# Patient Record
Sex: Female | Born: 1940 | Race: White | Hispanic: No | Marital: Married | Smoking: Never smoker
Health system: Southern US, Community
[De-identification: ages and names within clinical notes are randomized; demographics above are authoritative.]

## PROBLEM LIST (undated history)

## (undated) DIAGNOSIS — I4891 Unspecified atrial fibrillation: Secondary | ICD-10-CM

## (undated) DIAGNOSIS — K219 Gastro-esophageal reflux disease without esophagitis: Secondary | ICD-10-CM

## (undated) DIAGNOSIS — I10 Essential (primary) hypertension: Secondary | ICD-10-CM

## (undated) DIAGNOSIS — E785 Hyperlipidemia, unspecified: Secondary | ICD-10-CM

## (undated) DIAGNOSIS — Z8709 Personal history of other diseases of the respiratory system: Secondary | ICD-10-CM

## (undated) DIAGNOSIS — R2990 Unspecified symptoms and signs involving the nervous system: Secondary | ICD-10-CM

## (undated) DIAGNOSIS — F419 Anxiety disorder, unspecified: Secondary | ICD-10-CM

## (undated) DIAGNOSIS — R51 Headache: Secondary | ICD-10-CM

## (undated) DIAGNOSIS — J45909 Unspecified asthma, uncomplicated: Secondary | ICD-10-CM

## (undated) DIAGNOSIS — F32A Depression, unspecified: Secondary | ICD-10-CM

## (undated) DIAGNOSIS — Z87442 Personal history of urinary calculi: Secondary | ICD-10-CM

## (undated) DIAGNOSIS — R569 Unspecified convulsions: Secondary | ICD-10-CM

## (undated) DIAGNOSIS — T4145XA Adverse effect of unspecified anesthetic, initial encounter: Secondary | ICD-10-CM

## (undated) DIAGNOSIS — M1990 Unspecified osteoarthritis, unspecified site: Secondary | ICD-10-CM

## (undated) DIAGNOSIS — Z9889 Other specified postprocedural states: Secondary | ICD-10-CM

## (undated) DIAGNOSIS — T8859XA Other complications of anesthesia, initial encounter: Secondary | ICD-10-CM

## (undated) DIAGNOSIS — D649 Anemia, unspecified: Secondary | ICD-10-CM

## (undated) DIAGNOSIS — R112 Nausea with vomiting, unspecified: Secondary | ICD-10-CM

## (undated) DIAGNOSIS — M5126 Other intervertebral disc displacement, lumbar region: Secondary | ICD-10-CM

## (undated) HISTORY — PX: BACK SURGERY: SHX140

## (undated) HISTORY — DX: Other intervertebral disc displacement, lumbar region: M51.26

## (undated) HISTORY — DX: Unspecified convulsions: R56.9

## (undated) HISTORY — DX: Hyperlipidemia, unspecified: E78.5

## (undated) HISTORY — PX: TONSILLECTOMY: SUR1361

## (undated) HISTORY — PX: INNER EAR SURGERY: SHX679

## (undated) HISTORY — PX: ABDOMINAL HYSTERECTOMY: SHX81

## (undated) HISTORY — PX: ROTATOR CUFF REPAIR: SHX139

## (undated) HISTORY — DX: Unspecified symptoms and signs involving the nervous system: R29.90

---

## 1983-02-02 HISTORY — PX: FOOT TENDON SURGERY: SHX958

## 1997-07-08 ENCOUNTER — Ambulatory Visit (HOSPITAL_COMMUNITY): Admission: RE | Admit: 1997-07-08 | Discharge: 1997-07-08 | Payer: Self-pay | Admitting: Internal Medicine

## 1998-07-30 ENCOUNTER — Encounter: Payer: Self-pay | Admitting: Internal Medicine

## 1998-07-30 ENCOUNTER — Ambulatory Visit (HOSPITAL_COMMUNITY): Admission: RE | Admit: 1998-07-30 | Discharge: 1998-07-30 | Payer: Self-pay | Admitting: Internal Medicine

## 1998-08-22 ENCOUNTER — Encounter: Payer: Self-pay | Admitting: Neurosurgery

## 1998-08-26 ENCOUNTER — Encounter (INDEPENDENT_AMBULATORY_CARE_PROVIDER_SITE_OTHER): Payer: Self-pay

## 1998-08-26 ENCOUNTER — Observation Stay (HOSPITAL_COMMUNITY): Admission: RE | Admit: 1998-08-26 | Discharge: 1998-08-27 | Payer: Self-pay | Admitting: Neurosurgery

## 1998-08-26 ENCOUNTER — Encounter: Payer: Self-pay | Admitting: Neurosurgery

## 1999-07-29 ENCOUNTER — Encounter: Payer: Self-pay | Admitting: Neurosurgery

## 1999-07-31 ENCOUNTER — Ambulatory Visit (HOSPITAL_COMMUNITY): Admission: RE | Admit: 1999-07-31 | Discharge: 1999-07-31 | Payer: Self-pay | Admitting: Family Medicine

## 1999-07-31 ENCOUNTER — Encounter: Payer: Self-pay | Admitting: Family Medicine

## 1999-08-03 ENCOUNTER — Encounter: Payer: Self-pay | Admitting: Neurosurgery

## 1999-08-03 ENCOUNTER — Ambulatory Visit (HOSPITAL_COMMUNITY): Admission: RE | Admit: 1999-08-03 | Discharge: 1999-08-04 | Payer: Self-pay | Admitting: Neurosurgery

## 1999-09-03 ENCOUNTER — Ambulatory Visit (HOSPITAL_COMMUNITY): Admission: RE | Admit: 1999-09-03 | Discharge: 1999-09-03 | Payer: Self-pay | Admitting: Neurosurgery

## 1999-09-03 ENCOUNTER — Encounter: Payer: Self-pay | Admitting: Neurosurgery

## 1999-11-09 ENCOUNTER — Ambulatory Visit (HOSPITAL_COMMUNITY): Admission: RE | Admit: 1999-11-09 | Discharge: 1999-11-09 | Payer: Self-pay | Admitting: Neurosurgery

## 1999-11-09 ENCOUNTER — Encounter: Payer: Self-pay | Admitting: Neurosurgery

## 1999-11-10 ENCOUNTER — Encounter: Admission: RE | Admit: 1999-11-10 | Discharge: 1999-12-11 | Payer: Self-pay | Admitting: Neurosurgery

## 2000-09-16 ENCOUNTER — Encounter: Payer: Self-pay | Admitting: Family Medicine

## 2000-09-16 ENCOUNTER — Ambulatory Visit (HOSPITAL_COMMUNITY): Admission: RE | Admit: 2000-09-16 | Discharge: 2000-09-16 | Payer: Self-pay | Admitting: Family Medicine

## 2002-02-22 ENCOUNTER — Ambulatory Visit (HOSPITAL_COMMUNITY): Admission: RE | Admit: 2002-02-22 | Discharge: 2002-02-22 | Payer: Self-pay | Admitting: Family Medicine

## 2002-02-22 ENCOUNTER — Encounter: Payer: Self-pay | Admitting: Family Medicine

## 2003-02-25 ENCOUNTER — Ambulatory Visit (HOSPITAL_COMMUNITY): Admission: RE | Admit: 2003-02-25 | Discharge: 2003-02-25 | Payer: Self-pay | Admitting: Family Medicine

## 2003-12-10 ENCOUNTER — Encounter: Admission: RE | Admit: 2003-12-10 | Discharge: 2003-12-10 | Payer: Self-pay | Admitting: Family Medicine

## 2004-02-26 ENCOUNTER — Ambulatory Visit (HOSPITAL_COMMUNITY): Admission: RE | Admit: 2004-02-26 | Discharge: 2004-02-26 | Payer: Self-pay | Admitting: Family Medicine

## 2005-03-02 ENCOUNTER — Ambulatory Visit (HOSPITAL_COMMUNITY): Admission: RE | Admit: 2005-03-02 | Discharge: 2005-03-02 | Payer: Self-pay | Admitting: Family Medicine

## 2005-04-26 ENCOUNTER — Emergency Department (HOSPITAL_COMMUNITY): Admission: EM | Admit: 2005-04-26 | Discharge: 2005-04-27 | Payer: Self-pay | Admitting: Emergency Medicine

## 2005-05-04 ENCOUNTER — Ambulatory Visit (HOSPITAL_BASED_OUTPATIENT_CLINIC_OR_DEPARTMENT_OTHER): Admission: RE | Admit: 2005-05-04 | Discharge: 2005-05-04 | Payer: Self-pay | Admitting: Urology

## 2006-02-01 HISTORY — PX: FINGER MASS EXCISION: SHX1638

## 2006-03-03 ENCOUNTER — Ambulatory Visit (HOSPITAL_COMMUNITY): Admission: RE | Admit: 2006-03-03 | Discharge: 2006-03-03 | Payer: Self-pay | Admitting: Family Medicine

## 2006-05-04 ENCOUNTER — Encounter (INDEPENDENT_AMBULATORY_CARE_PROVIDER_SITE_OTHER): Payer: Self-pay | Admitting: Specialist

## 2006-05-04 ENCOUNTER — Ambulatory Visit (HOSPITAL_BASED_OUTPATIENT_CLINIC_OR_DEPARTMENT_OTHER): Admission: RE | Admit: 2006-05-04 | Discharge: 2006-05-04 | Payer: Self-pay | Admitting: Orthopedic Surgery

## 2007-03-06 ENCOUNTER — Ambulatory Visit (HOSPITAL_COMMUNITY): Admission: RE | Admit: 2007-03-06 | Discharge: 2007-03-06 | Payer: Self-pay | Admitting: Family Medicine

## 2008-03-07 ENCOUNTER — Ambulatory Visit (HOSPITAL_COMMUNITY): Admission: RE | Admit: 2008-03-07 | Discharge: 2008-03-07 | Payer: Self-pay | Admitting: Family Medicine

## 2008-05-15 ENCOUNTER — Encounter: Admission: RE | Admit: 2008-05-15 | Discharge: 2008-05-15 | Payer: Self-pay | Admitting: Sports Medicine

## 2008-05-29 ENCOUNTER — Encounter: Admission: RE | Admit: 2008-05-29 | Discharge: 2008-05-29 | Payer: Self-pay | Admitting: Sports Medicine

## 2008-06-11 ENCOUNTER — Encounter: Admission: RE | Admit: 2008-06-11 | Discharge: 2008-06-11 | Payer: Self-pay | Admitting: Sports Medicine

## 2008-06-26 ENCOUNTER — Encounter: Admission: RE | Admit: 2008-06-26 | Discharge: 2008-06-26 | Payer: Self-pay | Admitting: Sports Medicine

## 2008-09-20 ENCOUNTER — Inpatient Hospital Stay (HOSPITAL_COMMUNITY): Admission: RE | Admit: 2008-09-20 | Discharge: 2008-09-22 | Payer: Self-pay | Admitting: Neurosurgery

## 2009-01-07 ENCOUNTER — Encounter: Admission: RE | Admit: 2009-01-07 | Discharge: 2009-01-07 | Payer: Self-pay | Admitting: Sports Medicine

## 2009-03-28 ENCOUNTER — Ambulatory Visit (HOSPITAL_COMMUNITY): Admission: RE | Admit: 2009-03-28 | Discharge: 2009-03-28 | Payer: Self-pay | Admitting: Family Medicine

## 2010-02-24 ENCOUNTER — Other Ambulatory Visit (HOSPITAL_COMMUNITY): Payer: Self-pay | Admitting: Family Medicine

## 2010-02-24 DIAGNOSIS — Z139 Encounter for screening, unspecified: Secondary | ICD-10-CM

## 2010-02-24 DIAGNOSIS — Z1231 Encounter for screening mammogram for malignant neoplasm of breast: Secondary | ICD-10-CM

## 2010-03-30 ENCOUNTER — Ambulatory Visit (HOSPITAL_COMMUNITY): Admission: RE | Admit: 2010-03-30 | Payer: MEDICARE | Source: Ambulatory Visit

## 2010-04-06 ENCOUNTER — Ambulatory Visit (HOSPITAL_COMMUNITY)
Admission: RE | Admit: 2010-04-06 | Discharge: 2010-04-06 | Disposition: A | Discharge status: Home / Self Care | Payer: MEDICARE | Source: Ambulatory Visit | Attending: Family Medicine | Admitting: Family Medicine

## 2010-04-06 DIAGNOSIS — Z1231 Encounter for screening mammogram for malignant neoplasm of breast: Secondary | ICD-10-CM

## 2010-05-09 LAB — TYPE AND SCREEN
ABO/RH(D): A NEG
Antibody Screen: NEGATIVE

## 2010-05-09 LAB — BASIC METABOLIC PANEL
CO2: 29 mEq/L (ref 19–32)
Calcium: 10 mg/dL (ref 8.4–10.5)
GFR calc Af Amer: >60 mL/min (ref >60)
GFR calc non Af Amer: >60 mL/min (ref >60)
Potassium: 5.2 mEq/L — ABNORMAL HIGH (ref 3.5–5.1)

## 2010-05-09 LAB — CBC
HCT: 38 % (ref 36.0–46.0)
MCV: 91.6 fL (ref 78.0–100.0)

## 2010-05-09 LAB — ABO/RH: ABO/RH(D): A NEG

## 2010-05-09 LAB — DIFFERENTIAL
Eosinophils Relative: 23 % — ABNORMAL HIGH (ref 0–5)
Lymphocytes Relative: 24 % (ref 12–46)
Lymphs Abs: 1.6 10*3/uL (ref 0.7–4.0)
Neutro Abs: 3.2 10*3/uL (ref 1.7–7.7)
Neutrophils Relative %: 47 % (ref 43–77)

## 2010-06-16 NOTE — Op Note (Signed)
NAME:  Carol Gamble, Carol Gamble              ACCOUNT NO.:  [id number]   MEDICAL RECORD NO.:  [id number]          PATIENT TYPE:  INP   LOCATION:  3027                         FACILITY:  [facility]   PHYSICIAN:  Sherilyn Cooter A. Pool, M.D.    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  Status post L4-5  posterior lumbar fusion with instrumentation.   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  Status post L4-5  posterior lumbar fusion with instrumentation.   PROCEDURE:  Re-exploration of L3-4 laminectomy with complete bilateral  L3-4 decompressive laminectomies and bilateral L3 and L4 decompressive  foraminotomies, more than would be required for simple interbody fusion  alone.  L3-4 posterior lumbar fusion utilizing tangent interbody  allograft wedge, Telamon interbody PEEK cage and local autografting.  L3-  4 posterolateral arthrodesis utilizing nonsegmental pedicle screw  fixation and local autografting.  Re-exploration of L4-5 fusion with  removal of L4-5 instrumentation.   SURGEON:  Kathaleen Maser. Pool, MD   ASSISTANT:  Tia Alert, MD   ANESTHESIA:  General endotracheal.   INDICATION:  Carol Gamble is a 70 year old female with history of a  previous L4-5 decompression and fusion with instrumentation.  The  patient presents now with severe back and bilateral lower extremity  symptoms failing conservative management.  Workup demonstrates evidence  of spondylolisthesis with instability at L3-4 level with marked  stenosis.  The patient has solid fusion by radiographs at L4-5.  The  patient presents now for re-exploration of her L3-4 laminectomy with L3-  4 fusion.  This will require re-exploring her previous L4-5 fusion and  perform  a hardware removal of this level.   OPERATION:  The patient was brought to the operating room and placed in  supine position.  After adequate level of anesthesia was achieved, the  patient was placed prone on Wilson frame and appropriately padded.  The  patient's lumbar region was prepped and draped sterilely.  A 10 blade  was used to make curvilinear skin incision overlying the L3, L4 and L5  levels.  This was carried down sharply in the midline.  Subperiosteal  dissection was then performed exposing the lamina and facet joints of  L3, L4 and L5 as well as the previously placed pedicle screw fixation at  L4-5.  Pedicle screws at L4-5 were dissected free.  The STRS system was  then disassembled and the pedicle screws at L4-L5 were removed.  Fusion  was explored at L4-5 and found to be quite solid.  The previous  laminectomy defect at L4 was dissected free as was the inferior aspect  of the lamina of L3.  A complete laminectomy of L3 was then performed  using Leksell rongeurs, Kerrison rongeurs, high-speed drill to remove  the entire lamina of L3, the inferior facets of L3 bilaterally, the  superior facets of L4 bilaterally.  Epidural scar and ligamentum flavum  were elevated and resected in  a piecemeal fashion using Kerrison  rongeurs and wide decompressive foraminotomies were then performed along  the course of the exiting L3-L4 nerve roots bilaterally.  Epidural  venous plexus was coagulated and cut.  Bilateral diskectomies were then  performed at L3-4.  Disk space was then sequentially dilated up to 8 mm  and an 8-mm distractor left to the patient's left side.  Thecal sac and  nerve root was inspected on the right side.  Disk space then reamed and  cut with 8-mm tangent instruments on the right side.  Soft tissues were  removed from the interspace.  An 8 x 22 mm Telamon cage packed with  morselized autograft was then impacted into place recessed approximately  2 mm from the posterior  cortical margin of L3.  Distractors were removed  from the patient's left side.  Thecal sac and nerve root was inspected  on the left side.  Once again the disk space was then reamed and then  cut with 8-mm tangent instrument.  Soft tissues were removed from the  interspace.  Disk space was further curettaged.  Morselized autograft  was then packed into the interspace.  An 8 x 26 mm tangent wedge then  impacted into place and recessed approximately 2 mm from the posterior  cortical margin of L3.  Pedicles of L3 were then identified using  surface landmarks and intraoperative fluoroscopy.  Superficial bone  around the pedicle then removed using high-speed drill.  Each pedicle  was then probed using pedicle awl.  Pedicle awl track was then tapped  with 5.25 mm screw tap.  Each screw tap hole was then probed and found  to be solid with bone.  A 5.75 x 40 mm radius screws were placed  bilaterally at L3.  Previous pedicle screw holes at L4 were explored and  found to be solid within bone.  A 5.75 x 40 mm screws were placed  bilaterally at L4.  Transverse processes of L3 and L4 were decorticated  using high speed drill.  Morselized autograft was packed  posterolaterally for later fusion.  Short segment titanium rods were  placed over screw heads.  Locking caps were then placed over screw heads  and locking caps were then engaged with construct under compression.  Final images revealed good position of bone grafts and hardware at  proper operative level and normal alignment of spine.  Wound was then  irrigated one final time.  Gelfoam was placed topically for hemostasis  and found to be good.  Medium Hemovac drain left in epidural space.  Wound was then closed in layers with Vicryl sutures.  Steri-Strips and  sterile dressings were applied.  There were no complications.  The  patient tolerated the procedure well and she returned to the recovery  room postoperatively.            ______________________________  Kathaleen Maser Pool, M.D.     HAP/MEDQ  D:  09/20/2008  T:  09/21/2008  Job:  [id number]

## 2010-06-19 NOTE — Op Note (Signed)
NAME:  Carol Gamble, Carol Gamble              ACCOUNT NO.:  [id number]   MEDICAL RECORD NO.:  [id number]          PATIENT TYPE:  AMB   LOCATION:  DSC                          FACILITY:  [facility]   PHYSICIAN:  Matthew A. Weingold, M.D.DATE OF BIRTH:  07-Jun-1940   DATE OF PROCEDURE:  05/04/2006  DATE OF DISCHARGE:                               OPERATIVE REPORT   PREOPERATIVE DIAGNOSIS:  Left ring finger radial-sided proximal  interphalangeal joint mass.   POSTOPERATIVE DIAGNOSIS:  Left ring finger radial-sided proximal  interphalangeal joint mass.   PROCEDURE:  Excisional biopsy of lesion of radial side proximal  interphalangeal joint left ring finger through a mid lateral incision  with curettage of osseous lesion at the base of the middle phalanx at  the PIP joint.   SURGEON:  Artist Pais. Mina Marble, M.D.   ASSISTANT:  None.   ANESTHESIA:  General.   TOURNIQUET TIME:  38 minutes.   No complications.  No drains.  One specimen sent.   OPERATIVE REPORT:  The patient was taken to the operating suite.  After  the induction of adequate general anesthesia, the left upper extremity  was prepped and draped in sterile fashion.  An Esmarch was used to  exsanguinate the limb.  Tourniquet was then inflated to 250 mmHg.  At  this point in time a midline incision was made over the radial aspect of  the proximal interphalangeal joint of the left ring finger.  The skin  was incised.  The radial neurovascular bundle was carefully identified  and retracted volarly.  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  This was carefully removed using a rongeur.  At this point in time there was evidence of erosion of the lesion into  the base of the middle phalanx at the PIP joint level.  This was  carefully curettaged out using a House curette down to  normal-looking  cancellus bone.  This was extra-articular and approximately 5 mm x 6 to  7 mm in its size.  After this was done, the incision was extended  proximally for another centimeter.  Dissection was carried down until  normal tissue was identified.  The remainder of this probable giant cell  tumor was then carefully debrided with care to preserve the majority of  the collateral ligament.  A small portion of the collateral ligament was  taken down in order to facilitate removal of this probable giant cell  tumor.  Once the tumor had been evacuated, the wound was irrigated.  The  small incision in the radial collateral ligament was  carefully reapproximated using 4-0 Vicryl in three simple sutures  followed by skin closure with 3-0 Prolene in simple interrupted sutures  x7.  The PIP joint was stable to varus and valgus stress.  The wound was  dressed with Xeroform, 4 x 4's and a compressive hand dressing.  The  patient tolerated the procedure well.      Artist Pais  Mina Marble, M.D.  Electronically Signed     MAW/MEDQ  D:  05/04/2006  T:  05/04/2006  Job:  [id number]

## 2010-06-19 NOTE — Op Note (Signed)
NAMEKEYSHAWNA, Carol Gamble              ACCOUNT NO.:  [id number]   MEDICAL RECORD NO.:  [id number]          PATIENT TYPE:  AMB   LOCATION:  NESC                         FACILITY:  [facility]   PHYSICIAN:  Jamison Neighbor, M.D.  DATE OF BIRTH:  [birth year]   DATE OF PROCEDURE:  05/04/2005  DATE OF DISCHARGE:                                 OPERATIVE REPORT   PREOPERATIVE DIAGNOSIS:  Distal right ureteral obstruction secondary to  right ureteral calculus.   POSTOPERATIVE DIAGNOSIS:  Distal right ureteral obstruction secondary to  stricture.   PROCEDURE:  Cystoscopy right retrograde, right ureteroscopy and double J  catheter insertion.   SURGEON:  Jamison Neighbor, M.D.   ANESTHESIA:  General anesthesia.   COMPLICATIONS:  None.   DRAINS:  A 6 French x 26 cm double J catheter.   BRIEF HISTORY:  This 70 year old female has had problems with urgency and  frequency as well as pain on the right-hand side.  She was seen and  evaluated in the emergency room where a CT scan showed hydronephrosis on the  right with what was felt to be a 5 x 2 distal ureteral calculus that was  noted to be linear in nature.  The patient had urgency and frequency which  certainly would be caused by a stone impacted in the distal ureter.  The  patient has unable to pass the stone and has had this problem for over six  weeks.  She is now to undergo cystoscopy with stone extraction if present.  The patient understands the risks and benefits of the procedure and gave  full informed consent.   DESCRIPTION OF PROCEDURE:  After successful induction of general anesthesia,  the patient was placed in the dorsal lithotomy position, prepped with  Betadine and draped in the usual sterile fashion.  Cystoscopy was performed  and the bladder was carefully inspected.  It was free of any numerous  stones.  Both ureteral orifices were normal in configuration and location.  The right ureteral orifice was cannulated with a 6 French  catheter in  preparation for a retrograde pyelogram.   Retrograde pyelogram:  A retrograde pyelogram was performed through a 6  [place] ureteral catheter that was inserted into the right ureteral orifice.  The patient had a very narrow distal ureteral and the hydronephrosis was  identified, although it was relatively modest in nature.  The mid portion of  the ureter could not be well visualized because the patient does have  hardware from back surgery that obscures that portion of the ureter.  The  more proximal ureter was moderately dilated.  The collecting system was  slightly hydronephrotic.  No filling defects could be identified along the  course of the ureter or in the collecting system.   After the diagnostic retrograde had been performed, the guidewire was passed  up to the kidney in preparation for ureteroscopy.  When the ureteroscope was  inserted, it was clear that there was a very tight stricture that was not  much larger than the wire itself.  For that reason, a 10 cm balloon dilator  was  inserted into the distal ureter and balloon dilation was performed  After the dilation had been performed, the ureteroscope was inserted.  As  soon as the scope passed the area of stricture, the ureter was wide open and  the scope could easily pass all the way up into the kidney.  There was no  evidence of a UPJ obstruction or any angulation and that portion of the  kidney that could be inspected with the rigid ureteroscope showed no  evidence of any abnormalities.  It was not felt that there was any  indication for a flexible ureteroscopic examination of the lower pole  collecting system, but clearly there was no evidence of a stone or  obstruction in that area.  The entire ureter was then visualized and it  appeared to be completely normal with no stones.  In the distal ureter,  careful inspection was performed to see if there was a stone that was  impacted in the submucosal area and the  calcification that had been  identified on CT clearly could not be identified.  It was felt, however,  that the patient perhaps had had a stone in that area that became impacted  and subsequently eroded outside, causing the findings on CT but in any case,  the dilation of this distal ureter should take care of the patient's  problem.  Final inspection truly did not show any evidence of stone material  in that area.  The cystoscope was removed.  A 6 French x 26 cm double J  catheter was passed over the guidewire and allowed to coil normally within  the bladder as well as within the renal pelvis.  The patient tolerated the  procedure well and was taken to the recovery room in good condition.   She will be sent home with Lorcet Plus, Prosed DS and antibiotic coverage  and will have the double J catheter removed in one to two weeks.           ______________________________  Jamison Neighbor, M.D.  Electronically Signed     RJE/MEDQ  D:  05/04/2005  T:  05/05/2005  Job:  [id number]   cc:   Bryan Lemma. Manus Gunning, M.D.  Fax: [phone]

## 2010-06-19 NOTE — Op Note (Signed)
[place]  Patient:    Carol Gamble, Carol Gamble                     MRN: [id number] Proc. Date: 08/03/99 Adm. 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  PROCEDURE:  Re-exploration of L4-5 laminectomy with resection of synovial cyst, bilateral L4-5 microdiskectomies, with posterior lumbar interbody fusion utilizing Tangent wedges and local autograft.  Posterolateral fusion utilizing pedicle screw instrumentation and local autograft.  SURGEON:  Julio Sicks, M.D.  ASSISTANT:  Reinaldo Meeker, M.D.  ANESTHESIA:  General endotracheal.  INDICATIONS:  Carol Gamble is a 70 year old female who is status post previous right-sided L4-5 laminotomy with resection of synovial cyst at L4-5.  The patient presents now with left lower extremity symptoms consistent with a left-sided L5 radiculopathy.  MRI scanning demonstrates a large left-sided synovial cyst with severe compression of the thecal sac and L5 nerve root. She also now has a mild grade 1 spondylolisthesis at L4-5.  The patient has been counseled as to her options.  She has decided to proceed with decompression and fusion surgery for hopeful improvement of her symptoms.  DESCRIPTION OF PROCEDURE:  The patient was taken to the operating room and placed on the operating table in the supine position.  After general endotracheal anesthesia, the patient was positioned prone onto the Wilson frame, appropriately padded for operation of the lumbar.  She was shaved and prepped and draped sterilely, and a #10 blade was used to make a skin incision extending from L3 down to the top of the sacrum.  This was carried down sharply in the midline.  A subperiosteal dissection was performed, exposing the  lamina and facet joints at L3, L4, and L5.  The transverse processes of L4 and L5 were dissected free.  Intraoperative fluoroscopy was used, and the L4-5 level was confirmed.  The previous laminotomy at L4-5 on the right was dissected free.  A complete laminectomy at L4 was then performed using Leksell rongeurs, high-speed drill, and Kerrison rongeurs.  The ligamentum flavum between L3 and L4 was undercut and removed.  The residual ligamentum flavum and scar tissue was resected between L4 and L5.  The superior one-third of the lamina at L5 was resected.  Wide facetectomies were then performed bilaterally.  The underlying L4 and L5 nerve roots were identified bilaterally.  The left-sided synovial cyst came clearly into view.  The microscope was brought into the field and used for microdissection of the left-sided synovial cyst.  This was dissected free off the underlying thecal sac.  It was resected completely.  The specimen was sent to pathology.  There was no evidence of injury to the nerve roots or thecal sac.  There was no evidence of any residual cyst.  The right-sided thecal sac and nerve roots were freed from the epidural scarring.  Starting first on the patients left side, the thecal sac and L5 nerve root were mobilized to the midline.  The disk space was then incised with a #15 blade and an aggressive diskectomy was then performed using pituitary rongeurs, upward-angled, pituitary rongeurs, and Epstein curettes.  The procedure was then repeated on the contralateral side,  again without complications.  The microscope was removed.  Preparation was made for interbody fusion.  Starting first in the patients left side, the disk space in the left side was distracted up to 11 mm.  Disk space was isolated, and the nerve roots were protected on the right side.  Disk space was then reamed using a box cutter and then subsequently cut using a 10 mm Danek Tangent chisel.  Fluoroscopic guidance  revealed good positioning of the instruments.  The chisel was removed.  A 10 x 26 mm Tangent wedge was then impacted into place on the patients right side.  Distractor was then removed. Intraoperative fluoroscopy revealed good position of the bone graft at the proper operative level with a normal angle of the spine.  Attention was placed on the patients left side.  The thecal sac and nerve roots were protected. Once again the disk space was reamed and then subsequently cut with a medium chisel.  The disk space was then packed with morcellized autograft left over from the laminectomy.  A second 10 x 26 mm Tangent wedge was then impacted into place.  Once again, fluoroscopic guidance revealed good position of the bone grafts and proper operative level with a normal angle of the spine.  All nerve roots were free.  There was no evidence of any injury to the nerve roots or thecal sac.  The pedicles were then isolated at L4 and L5 bilaterally. Superficial cortical bone was removed using the high-speed drill.  The pedicles were then probed using a pedicle awl at L4 and L5 bilaterally.  This was accomplished under fluoroscopic guidance.  Each pedicle awl tract was found to be solidly within bone.  Each pedicle awl tract was then tapped using the 5.25 mm tap.  Subsequently a 5.75 x 40 mm SDRS variable-headed pedicle screw was placed in all four locations.  All four locations were found to be solidly within bone.  A short segment of titanium rod was placed over the screw caps between L4 and L5, and locking caps were placed.  The caps were then subsequently engaged in a sequential fashion to place the construct under compression.  Final images with the fluoroscopy revealed good position of bone grafts and proper operative level with a normal angle of the spine. Instrumentation was well-placed in both the lateral and AP planes.  The spinal canal was inspected and found to be free of any further  compression.  A probe was passed easily along the course of the L4 and L5 nerve roots bilaterally. The wound was then copiously irrigated with saline solution.  The transverse processes of L4 and L5 were then decorticated using the high-speed drill.  Morcellized autograft was packed posterolaterally for later fusion.  The wound was irrigated one final time.  Gelfoam was placed topically in the epidural space for hemostasis, which was found to be good.  A medium Hemovac drain was left in the epidural space and exited through a separate stab incision.  The wound was then closed in layers with Vicryl sutures.  Staples applied to the surface.  There were no apparent complications.  The patient tolerated the procedure well, and she returned to the recovery room postoperatively. DD:  08/03/99 TD:  08/03/99 Job: [id number]

## 2011-03-23 ENCOUNTER — Other Ambulatory Visit (HOSPITAL_COMMUNITY): Payer: Self-pay | Admitting: Family Medicine

## 2011-03-23 DIAGNOSIS — Z1231 Encounter for screening mammogram for malignant neoplasm of breast: Secondary | ICD-10-CM

## 2011-04-15 ENCOUNTER — Ambulatory Visit (HOSPITAL_COMMUNITY)
Admission: RE | Admit: 2011-04-15 | Discharge: 2011-04-15 | Disposition: A | Discharge status: Home / Self Care | Payer: Medicare Other | Source: Ambulatory Visit | Attending: Family Medicine | Admitting: Family Medicine

## 2011-04-15 DIAGNOSIS — Z1231 Encounter for screening mammogram for malignant neoplasm of breast: Secondary | ICD-10-CM

## 2012-03-14 ENCOUNTER — Other Ambulatory Visit (HOSPITAL_COMMUNITY): Payer: Self-pay | Admitting: Family Medicine

## 2012-03-14 DIAGNOSIS — Z1231 Encounter for screening mammogram for malignant neoplasm of breast: Secondary | ICD-10-CM

## 2012-05-17 ENCOUNTER — Ambulatory Visit (HOSPITAL_COMMUNITY)
Admission: RE | Admit: 2012-05-17 | Discharge: 2012-05-17 | Disposition: A | Discharge status: Home / Self Care | Payer: Medicare Other | Source: Ambulatory Visit | Attending: Family Medicine | Admitting: Family Medicine

## 2012-05-17 DIAGNOSIS — Z1231 Encounter for screening mammogram for malignant neoplasm of breast: Secondary | ICD-10-CM

## 2012-06-01 ENCOUNTER — Other Ambulatory Visit: Payer: Self-pay | Admitting: Neurosurgery

## 2012-06-01 DIAGNOSIS — M545 Low back pain, unspecified: Secondary | ICD-10-CM

## 2012-06-06 ENCOUNTER — Ambulatory Visit
Admission: RE | Admit: 2012-06-06 | Discharge: 2012-06-06 | Disposition: A | Discharge status: Home / Self Care | Payer: Medicare Other | Source: Ambulatory Visit | Attending: Neurosurgery | Admitting: Neurosurgery

## 2012-06-06 ENCOUNTER — Other Ambulatory Visit: Payer: Self-pay | Admitting: Neurosurgery

## 2012-06-06 DIAGNOSIS — M545 Low back pain, unspecified: Secondary | ICD-10-CM

## 2012-06-06 MED ORDER — GADOBENATE DIMEGLUMINE 529 MG/ML IV SOLN
20.0000 mL | Freq: Once | INTRAVENOUS | Status: AC | PRN
  Administered 2012-06-06: 20 mL via INTRAVENOUS

## 2012-06-19 ENCOUNTER — Encounter (HOSPITAL_COMMUNITY): Payer: Self-pay | Admitting: Pharmacy Technician

## 2012-06-21 ENCOUNTER — Other Ambulatory Visit: Payer: Self-pay | Admitting: Neurosurgery

## 2012-06-22 ENCOUNTER — Encounter (HOSPITAL_COMMUNITY)
Admission: RE | Admit: 2012-06-22 | Discharge: 2012-06-22 | Disposition: A | Discharge status: Home / Self Care | Payer: Medicare Other | Source: Ambulatory Visit | Attending: Neurosurgery | Admitting: Neurosurgery

## 2012-06-22 ENCOUNTER — Encounter (HOSPITAL_COMMUNITY): Payer: Self-pay

## 2012-06-22 DIAGNOSIS — Z01818 Encounter for other preprocedural examination: Secondary | ICD-10-CM | POA: Insufficient documentation

## 2012-06-22 DIAGNOSIS — Z01810 Encounter for preprocedural cardiovascular examination: Secondary | ICD-10-CM | POA: Insufficient documentation

## 2012-06-22 DIAGNOSIS — Z01812 Encounter for preprocedural laboratory examination: Secondary | ICD-10-CM | POA: Insufficient documentation

## 2012-06-22 HISTORY — DX: Other specified postprocedural states: Z98.890

## 2012-06-22 HISTORY — DX: Essential (primary) hypertension: I10

## 2012-06-22 HISTORY — DX: Nausea with vomiting, unspecified: R11.2

## 2012-06-22 HISTORY — DX: Adverse effect of unspecified anesthetic, initial encounter: T41.45XA

## 2012-06-22 HISTORY — DX: Unspecified osteoarthritis, unspecified site: M19.90

## 2012-06-22 HISTORY — DX: Other complications of anesthesia, initial encounter: T88.59XA

## 2012-06-22 HISTORY — DX: Other specified postprocedural states: R11.2

## 2012-06-22 LAB — CBC WITH DIFFERENTIAL/PLATELET
Basophils Relative: 1 % (ref 0–1)
Eosinophils Absolute: 0.4 10*3/uL (ref 0.0–0.7)
Hemoglobin: 13.9 g/dL (ref 12.0–15.0)
MCH: 29.3 pg (ref 26.0–34.0)
MCHC: 33.2 g/dL (ref 30.0–36.0)
Monocytes Absolute: 0.5 10*3/uL (ref 0.1–1.0)
Monocytes Relative: 8 % (ref 3–12)
Neutrophils Relative %: 50 % (ref 43–77)
RDW: 13.9 % (ref 11.5–15.5)

## 2012-06-22 LAB — BASIC METABOLIC PANEL
BUN: 13 mg/dL (ref 6–23)
Creatinine, Ser: 0.74 mg/dL (ref 0.50–1.10)
GFR calc non Af Amer: 84 mL/min — ABNORMAL LOW (ref >90)
Glucose, Bld: 96 mg/dL (ref 70–99)
Potassium: 4.4 mEq/L (ref 3.5–5.1)

## 2012-06-22 NOTE — Progress Notes (Signed)
Call to A. Zelenak,PAC, with anesthesia, reported pt.'s remarks relative to previous anesth. Experience.

## 2012-06-22 NOTE — Progress Notes (Signed)
Anesthesia Chart Review:  Patient is a 72 year old female scheduled for L2-3 PLIF with Globus transition dynamic rod system, right L2-3 microdiskectomy, removal of L3-4 hardware on 06/30/12 by Dr. Jordan Likes.  History includes obesity, HTN, arthritis, non-smoker, hysterectomy, tonsillectomy, L4-5 laminectomy with resection of synovial cyst with PLIF 08/03/99, L3-4 foraminotomies with PLIF 09/20/08.  For anesthesia history, she reports post-operative N/V.  After a prior neurosurgical procedure with Dr. Jordan Likes, she reported that when she awoke in PACU she had a difficult time catching her breath and was having some issues with her pulse rate and BP. She hopes to avoid a similar situation in the future.  I was able to obtain her old chart from her second surgery with Dr. Jordan Likes in 09/2008.  Recorded vitals in PACU showed SBP of 113-149 and DBP of 54-75.  HR was documented as 54-96 bpm.  Sats were 93-99% on 2-3L/[place].  No anesthesia complications were documented on her post-op note.  There was also no documentation in the progress notes of any hemodynamic instability.   EKG on 06/22/12 showed NSR.  She does have low r waves in leads III and aVF, that was also present on her prior EKG from 09/16/08 (see old chart or Muse).  CXR on 06/22/12 showed no active disease.  No significant change.  Preoperative labs noted.  She will meet with her assigned anesthesiologist on the day of surgery to discuss the definitve anesthesia plan.  Velna Ochs [facility]/Anesthesiology Phone [phone] 06/22/2012 4:21 PM

## 2012-06-22 NOTE — Pre-Procedure Instructions (Signed)
Carol Gamble  06/22/2012   Your procedure is scheduled on:  06/30/2012- FRIDAY  Report to [facility] at 5:30 AM.  Call this number if you have problems the morning of surgery: [phone]   Remember:   Do not eat food or drink liquids after midnight.- Thursday   Take these medicines the morning of surgery with A SIP OF WATER: Atenolol, nasal spray   Do not wear jewelry, make-up or nail polish.  Do not wear lotions, powders, or perfumes. You may wear deodorant.  Do not shave 48 hours prior to surgery.   Do not bring valuables to the hospital.  Contacts, dentures or bridgework may not be worn into surgery.  Leave suitcase in the car. After surgery it may be brought to your room.  For patients admitted to the hospital, checkout time is 11:00 AM the day of  discharge.   Patients discharged the day of surgery will not be allowed to drive  home.  Name and phone number of your driver: /w spouse  Special Instructions: Shower using CHG 2 nights before surgery and the night before surgery.  If you shower the day of surgery use CHG.  Use special wash - you have one bottle of CHG for all showers.  You should use approximately 1/3 of the bottle for each shower.   Please read over the following fact sheets that you were given: Pain Booklet, Coughing and Deep Breathing, Blood Transfusion Information, MRSA Information and Surgical Site Infection Prevention

## 2012-06-29 MED ORDER — CEFAZOLIN SODIUM-DEXTROSE 2-3 GM-% IV SOLR
2.0000 g | INTRAVENOUS | Status: AC
  Administered 2012-06-30: 2 g via INTRAVENOUS
  Filled 2012-06-29: qty 50

## 2012-06-30 ENCOUNTER — Encounter (HOSPITAL_COMMUNITY)
Admission: RE | Disposition: A | Discharge status: Home / Self Care | Payer: Self-pay | Source: Ambulatory Visit | Attending: Neurosurgery

## 2012-06-30 ENCOUNTER — Inpatient Hospital Stay (HOSPITAL_COMMUNITY): Payer: Medicare Other

## 2012-06-30 ENCOUNTER — Inpatient Hospital Stay (HOSPITAL_COMMUNITY)
Admission: RE | Admit: 2012-06-30 | Discharge: 2012-07-01 | DRG: 460 | Disposition: A | Discharge status: Home / Self Care | Payer: Medicare Other | Source: Ambulatory Visit | Attending: Neurosurgery | Admitting: Neurosurgery

## 2012-06-30 ENCOUNTER — Encounter (HOSPITAL_COMMUNITY): Payer: Self-pay | Admitting: *Deleted

## 2012-06-30 ENCOUNTER — Inpatient Hospital Stay (HOSPITAL_COMMUNITY): Payer: Medicare Other | Admitting: Anesthesiology

## 2012-06-30 ENCOUNTER — Encounter (HOSPITAL_COMMUNITY): Payer: Self-pay | Admitting: Vascular Surgery

## 2012-06-30 DIAGNOSIS — Z472 Encounter for removal of internal fixation device: Secondary | ICD-10-CM

## 2012-06-30 DIAGNOSIS — Z79899 Other long term (current) drug therapy: Secondary | ICD-10-CM

## 2012-06-30 DIAGNOSIS — I10 Essential (primary) hypertension: Secondary | ICD-10-CM | POA: Diagnosis present

## 2012-06-30 DIAGNOSIS — M5126 Other intervertebral disc displacement, lumbar region: Secondary | ICD-10-CM | POA: Diagnosis present

## 2012-06-30 HISTORY — DX: Other intervertebral disc displacement, lumbar region: M51.26

## 2012-06-30 LAB — GLUCOSE, CAPILLARY: Glucose-Capillary: 100 mg/dL — ABNORMAL HIGH (ref 70–99)

## 2012-06-30 SURGERY — POSTERIOR LUMBAR FUSION 1 WITH HARDWARE REMOVAL
Anesthesia: General | Site: Back | Wound class: Clean

## 2012-06-30 MED ORDER — MIDAZOLAM HCL 5 MG/5ML IJ SOLN
INTRAMUSCULAR | Status: DC | PRN
  Administered 2012-06-30: 2 mg via INTRAVENOUS

## 2012-06-30 MED ORDER — PHENOL 1.4 % MT LIQD: 1.0000 | OROMUCOSAL | Status: DC | PRN

## 2012-06-30 MED ORDER — LISINOPRIL 20 MG PO TABS
20.0000 mg | ORAL_TABLET | Freq: Every day | ORAL | Status: DC
  Administered 2012-07-01: 20 mg via ORAL
  Filled 2012-06-30 (×3): qty 1

## 2012-06-30 MED ORDER — VECURONIUM BROMIDE 10 MG IV SOLR
INTRAVENOUS | Status: DC | PRN
  Administered 2012-06-30: 3 mg via INTRAVENOUS

## 2012-06-30 MED ORDER — ALUM & MAG HYDROXIDE-SIMETH 200-200-20 MG/5ML PO SUSP: 30.0000 mL | Freq: Four times a day (QID) | ORAL | Status: DC | PRN

## 2012-06-30 MED ORDER — ASPIRIN 325 MG PO TABS
325.0000 mg | ORAL_TABLET | Freq: Four times a day (QID) | ORAL | Status: DC | PRN
  Administered 2012-06-30 (×2): 325 mg via ORAL
  Filled 2012-06-30 (×2): qty 1

## 2012-06-30 MED ORDER — PROPOFOL 10 MG/ML IV BOLUS
INTRAVENOUS | Status: DC | PRN
  Administered 2012-06-30: 150 mg via INTRAVENOUS

## 2012-06-30 MED ORDER — LACTATED RINGERS IV SOLN
INTRAVENOUS | Status: DC | PRN
  Administered 2012-06-30 (×3): via INTRAVENOUS

## 2012-06-30 MED ORDER — HYDROMORPHONE HCL PF 1 MG/ML IJ SOLN
INTRAMUSCULAR | Status: AC
  Filled 2012-06-30: qty 1

## 2012-06-30 MED ORDER — ACETAMINOPHEN 650 MG RE SUPP: 650.0000 mg | RECTAL | Status: DC | PRN

## 2012-06-30 MED ORDER — ONDANSETRON HCL 4 MG/2ML IJ SOLN: 4.0000 mg | INTRAMUSCULAR | Status: DC | PRN

## 2012-06-30 MED ORDER — SODIUM CHLORIDE 0.9 % IV SOLN
INTRAVENOUS | Status: AC
  Filled 2012-06-30: qty 500

## 2012-06-30 MED ORDER — OMEGA-3-ACID ETHYL ESTERS 1 G PO CAPS
1.0000 | ORAL_CAPSULE | Freq: Two times a day (BID) | ORAL | Status: DC
  Administered 2012-06-30 – 2012-07-01 (×2): 1 g via ORAL
  Filled 2012-06-30 (×3): qty 1

## 2012-06-30 MED ORDER — MENTHOL 3 MG MT LOZG: 1.0000 | LOZENGE | OROMUCOSAL | Status: DC | PRN

## 2012-06-30 MED ORDER — DIPHENHYDRAMINE HCL 25 MG PO CAPS: 25.0000 mg | ORAL_CAPSULE | Freq: Every day | ORAL | Status: DC | PRN

## 2012-06-30 MED ORDER — FENTANYL CITRATE 0.05 MG/ML IJ SOLN
INTRAMUSCULAR | Status: DC | PRN
  Administered 2012-06-30 (×2): 100 ug via INTRAVENOUS
  Administered 2012-06-30: 50 ug via INTRAVENOUS
  Administered 2012-06-30 (×2): 100 ug via INTRAVENOUS
  Administered 2012-06-30: 50 ug via INTRAVENOUS

## 2012-06-30 MED ORDER — FLUTICASONE PROPIONATE 50 MCG/ACT NA SUSP
2.0000 | Freq: Every day | NASAL | Status: DC
  Administered 2012-07-01: 2 via NASAL
  Filled 2012-06-30: qty 16

## 2012-06-30 MED ORDER — SODIUM CHLORIDE 0.9 % IJ SOLN: 3.0000 mL | INTRAMUSCULAR | Status: DC | PRN

## 2012-06-30 MED ORDER — FLEET ENEMA 7-19 GM/118ML RE ENEM: 1.0000 | ENEMA | Freq: Once | RECTAL | Status: AC | PRN

## 2012-06-30 MED ORDER — SODIUM CHLORIDE 0.9 % IJ SOLN
3.0000 mL | Freq: Two times a day (BID) | INTRAMUSCULAR | Status: DC
  Administered 2012-07-01: 3 mL via INTRAVENOUS

## 2012-06-30 MED ORDER — ROCURONIUM BROMIDE 100 MG/10ML IV SOLN
INTRAVENOUS | Status: DC | PRN
  Administered 2012-06-30: 50 mg via INTRAVENOUS

## 2012-06-30 MED ORDER — ZOLPIDEM TARTRATE 5 MG PO TABS: 5.0000 mg | ORAL_TABLET | Freq: Every evening | ORAL | Status: DC | PRN

## 2012-06-30 MED ORDER — LISINOPRIL-HYDROCHLOROTHIAZIDE 20-12.5 MG PO TABS: 1.0000 | ORAL_TABLET | Freq: Every day | ORAL | Status: DC

## 2012-06-30 MED ORDER — ACETAMINOPHEN 325 MG PO TABS: 650.0000 mg | ORAL_TABLET | ORAL | Status: DC | PRN

## 2012-06-30 MED ORDER — CEFAZOLIN SODIUM 1-5 GM-% IV SOLN
1.0000 g | Freq: Three times a day (TID) | INTRAVENOUS | Status: AC
  Administered 2012-06-30 – 2012-07-01 (×2): 1 g via INTRAVENOUS
  Filled 2012-06-30 (×2): qty 50

## 2012-06-30 MED ORDER — BUPIVACAINE HCL (PF) 0.25 % IJ SOLN
INTRAMUSCULAR | Status: DC | PRN
  Administered 2012-06-30: 20 mL

## 2012-06-30 MED ORDER — SODIUM CHLORIDE 0.9 % IR SOLN
Status: DC | PRN
  Administered 2012-06-30: 09:00:00

## 2012-06-30 MED ORDER — POLYVINYL ALCOHOL 1.4 % OP SOLN
2.0000 [drp] | Freq: Every day | OPHTHALMIC | Status: DC
  Administered 2012-06-30 – 2012-07-01 (×2): 2 [drp] via OPHTHALMIC
  Filled 2012-06-30: qty 15

## 2012-06-30 MED ORDER — BISACODYL 10 MG RE SUPP: 10.0000 mg | Freq: Every day | RECTAL | Status: DC | PRN

## 2012-06-30 MED ORDER — EPHEDRINE SULFATE 50 MG/ML IJ SOLN
INTRAMUSCULAR | Status: DC | PRN
  Administered 2012-06-30: 10 mg via INTRAVENOUS
  Administered 2012-06-30: 15 mg via INTRAVENOUS

## 2012-06-30 MED ORDER — HYDROCHLOROTHIAZIDE 12.5 MG PO CAPS
12.5000 mg | ORAL_CAPSULE | Freq: Every day | ORAL | Status: DC
  Administered 2012-07-01: 12.5 mg via ORAL
  Filled 2012-06-30 (×3): qty 1

## 2012-06-30 MED ORDER — SODIUM CHLORIDE 0.9 % IV SOLN: 250.0000 mL | INTRAVENOUS | Status: DC

## 2012-06-30 MED ORDER — HYPROMELLOSE (GONIOSCOPIC) 2.5 % OP SOLN
2.0000 [drp] | Freq: Every morning | OPHTHALMIC | Status: DC
  Filled 2012-06-30: qty 15

## 2012-06-30 MED ORDER — LIDOCAINE HCL (CARDIAC) 20 MG/ML IV SOLN
INTRAVENOUS | Status: DC | PRN
  Administered 2012-06-30: 60 mg via INTRAVENOUS

## 2012-06-30 MED ORDER — ONDANSETRON HCL 4 MG/2ML IJ SOLN
INTRAMUSCULAR | Status: DC | PRN
  Administered 2012-06-30: 4 mg via INTRAVENOUS

## 2012-06-30 MED ORDER — HYDROCODONE-ACETAMINOPHEN 5-325 MG PO TABS: 1.0000 | ORAL_TABLET | ORAL | Status: DC | PRN

## 2012-06-30 MED ORDER — SIMVASTATIN 40 MG PO TABS
40.0000 mg | ORAL_TABLET | Freq: Every day | ORAL | Status: DC
  Administered 2012-06-30: 40 mg via ORAL
  Filled 2012-06-30 (×2): qty 1

## 2012-06-30 MED ORDER — OXYCODONE-ACETAMINOPHEN 5-325 MG PO TABS
1.0000 | ORAL_TABLET | ORAL | Status: DC | PRN
  Administered 2012-06-30 – 2012-07-01 (×5): 2 via ORAL
  Filled 2012-06-30 (×5): qty 2

## 2012-06-30 MED ORDER — THROMBIN 20000 UNITS EX SOLR
CUTANEOUS | Status: DC | PRN
  Administered 2012-06-30: 09:00:00 via TOPICAL

## 2012-06-30 MED ORDER — 0.9 % SODIUM CHLORIDE (POUR BTL) OPTIME
TOPICAL | Status: DC | PRN
  Administered 2012-06-30: 1000 mL

## 2012-06-30 MED ORDER — SENNA 8.6 MG PO TABS
1.0000 | ORAL_TABLET | Freq: Two times a day (BID) | ORAL | Status: DC
  Administered 2012-06-30 – 2012-07-01 (×3): 8.6 mg via ORAL
  Filled 2012-06-30 (×4): qty 1

## 2012-06-30 MED ORDER — HYDROMORPHONE HCL PF 1 MG/ML IJ SOLN: 0.5000 mg | INTRAMUSCULAR | Status: DC | PRN

## 2012-06-30 MED ORDER — ATENOLOL 50 MG PO TABS
50.0000 mg | ORAL_TABLET | Freq: Every day | ORAL | Status: DC
  Administered 2012-07-01: 50 mg via ORAL
  Filled 2012-06-30 (×2): qty 1

## 2012-06-30 MED ORDER — CYCLOBENZAPRINE HCL 10 MG PO TABS: 10.0000 mg | ORAL_TABLET | Freq: Three times a day (TID) | ORAL | Status: DC | PRN

## 2012-06-30 MED ORDER — POLYETHYLENE GLYCOL 3350 17 G PO PACK
17.0000 g | PACK | Freq: Every day | ORAL | Status: DC | PRN
  Filled 2012-06-30: qty 1

## 2012-06-30 MED ORDER — DEXAMETHASONE SODIUM PHOSPHATE 10 MG/ML IJ SOLN
INTRAMUSCULAR | Status: AC
  Administered 2012-06-30: 10 mg via INTRAVENOUS
  Filled 2012-06-30: qty 1

## 2012-06-30 MED ORDER — HYDROMORPHONE HCL PF 1 MG/ML IJ SOLN
0.2500 mg | INTRAMUSCULAR | Status: DC | PRN
  Administered 2012-06-30 (×2): 0.5 mg via INTRAVENOUS

## 2012-06-30 MED ORDER — DEXAMETHASONE SODIUM PHOSPHATE 10 MG/ML IJ SOLN: 10.0000 mg | INTRAMUSCULAR | Status: DC

## 2012-06-30 MED ORDER — BACITRACIN 50000 UNITS IM SOLR
INTRAMUSCULAR | Status: AC
  Filled 2012-06-30: qty 1

## 2012-06-30 SURGICAL SUPPLY — 61 items
BAG DECANTER FOR FLEXI CONT (MISCELLANEOUS) ×2 IMPLANT
BENZOIN TINCTURE PRP APPL 2/3 (GAUZE/BANDAGES/DRESSINGS) ×2 IMPLANT
BLADE SURG ROTATE 9660 (MISCELLANEOUS) IMPLANT
BRUSH SCRUB EZ PLAIN DRY (MISCELLANEOUS) ×2 IMPLANT
BUR MATCHSTICK NEURO 3.0 LAGG (BURR) ×2 IMPLANT
CANISTER SUCTION 2500CC (MISCELLANEOUS) ×2 IMPLANT
CAP TRANSITION LUMBAR (Cap) ×8 IMPLANT
CLOTH BEACON ORANGE TIMEOUT ST (SAFETY) ×2 IMPLANT
CONT SPEC 4OZ CLIKSEAL STRL BL (MISCELLANEOUS) ×4 IMPLANT
CORDS BIPOLAR (ELECTRODE) ×4 IMPLANT
COVER BACK TABLE 24X17X13 BIG (DRAPES) IMPLANT
COVER TABLE BACK 60X90 (DRAPES) ×2 IMPLANT
DECANTER SPIKE VIAL GLASS SM (MISCELLANEOUS) ×2 IMPLANT
DERMABOND ADVANCED (GAUZE/BANDAGES/DRESSINGS) ×1
DERMABOND ADVANCED .7 DNX12 (GAUZE/BANDAGES/DRESSINGS) ×1 IMPLANT
DRAPE C-ARM 42X72 X-RAY (DRAPES) ×4 IMPLANT
DRAPE LAPAROTOMY 100X72X124 (DRAPES) ×2 IMPLANT
DRAPE MICROSCOPE LEICA (MISCELLANEOUS) ×2 IMPLANT
DRAPE POUCH INSTRU U-SHP 10X18 (DRAPES) ×2 IMPLANT
DRAPE PROXIMA HALF (DRAPES) ×2 IMPLANT
DRAPE SURG 17X23 STRL (DRAPES) ×8 IMPLANT
DURAPREP 26ML APPLICATOR (WOUND CARE) ×2 IMPLANT
ELECT REM PT RETURN 9FT ADLT (ELECTROSURGICAL) ×2
ELECTRODE REM PT RTRN 9FT ADLT (ELECTROSURGICAL) ×1 IMPLANT
EVACUATOR 1/8 PVC DRAIN (DRAIN) ×2 IMPLANT
GAUZE SPONGE 4X4 16PLY XRAY LF (GAUZE/BANDAGES/DRESSINGS) IMPLANT
GLOVE BIOGEL PI IND STRL 7.0 (GLOVE) ×2 IMPLANT
GLOVE BIOGEL PI IND STRL 7.5 (GLOVE) ×1 IMPLANT
GLOVE BIOGEL PI INDICATOR 7.0 (GLOVE) ×2
GLOVE BIOGEL PI INDICATOR 7.5 (GLOVE) ×1
GLOVE ECLIPSE 8.5 STRL (GLOVE) ×4 IMPLANT
GLOVE EXAM NITRILE LRG STRL (GLOVE) IMPLANT
GLOVE EXAM NITRILE MD LF STRL (GLOVE) IMPLANT
GLOVE EXAM NITRILE XL STR (GLOVE) IMPLANT
GLOVE EXAM NITRILE XS STR PU (GLOVE) IMPLANT
GLOVE SS BIOGEL STRL SZ 6.5 (GLOVE) ×2 IMPLANT
GLOVE SUPERSENSE BIOGEL SZ 6.5 (GLOVE) ×2
GOWN BRE IMP SLV AUR LG STRL (GOWN DISPOSABLE) ×2 IMPLANT
GOWN BRE IMP SLV AUR XL STRL (GOWN DISPOSABLE) ×4 IMPLANT
GOWN STRL REIN 2XL LVL4 (GOWN DISPOSABLE) IMPLANT
KIT BASIN OR (CUSTOM PROCEDURE TRAY) ×2 IMPLANT
KIT ROOM TURNOVER OR (KITS) ×2 IMPLANT
NEEDLE HYPO 22GX1.5 SAFETY (NEEDLE) ×2 IMPLANT
NS IRRIG 1000ML POUR BTL (IV SOLUTION) ×2 IMPLANT
PACK LAMINECTOMY NEURO (CUSTOM PROCEDURE TRAY) ×2 IMPLANT
ROD TRANSITION LVL1 22MM SPINE (Rod) ×4 IMPLANT
RUBBERBAND STERILE (MISCELLANEOUS) ×4 IMPLANT
SCREW PEDICLE 6.5X40MM (Screw) ×8 IMPLANT
SPONGE GAUZE 4X4 12PLY (GAUZE/BANDAGES/DRESSINGS) ×2 IMPLANT
SPONGE SURGIFOAM ABS GEL 100 (HEMOSTASIS) ×2 IMPLANT
STRIP CLOSURE SKIN 1/2X4 (GAUZE/BANDAGES/DRESSINGS) ×2 IMPLANT
SUT VIC AB 0 CT1 18XCR BRD8 (SUTURE) ×2 IMPLANT
SUT VIC AB 0 CT1 8-18 (SUTURE) ×2
SUT VIC AB 2-0 CT1 18 (SUTURE) ×4 IMPLANT
SUT VIC AB 3-0 SH 8-18 (SUTURE) ×4 IMPLANT
SYR 20ML ECCENTRIC (SYRINGE) ×2 IMPLANT
TAPE CLOTH SURG 4X10 WHT LF (GAUZE/BANDAGES/DRESSINGS) ×2 IMPLANT
TOWEL OR 17X24 6PK STRL BLUE (TOWEL DISPOSABLE) ×2 IMPLANT
TOWEL OR 17X26 10 PK STRL BLUE (TOWEL DISPOSABLE) ×2 IMPLANT
TRAY FOLEY CATH 14FRSI W/METER (CATHETERS) ×2 IMPLANT
WATER STERILE IRR 1000ML POUR (IV SOLUTION) ×2 IMPLANT

## 2012-06-30 NOTE — H&P (Signed)
Carol Gamble is an 72 y.o. female.   Chief Complaint: Back and right leg pain HPI: 72 year old female status post previous L3-4 L4-5 and L5-S1 fusions presents now with back and right anterior thigh pain and weakness. Workup demonstrates evidence of an acute right-sided L2-3 disc herniation with some superior migration. Fusion appears solid at L3-4 L4-5 L5-S1. We discussed options for management. She still conservative management. She wishes to proceed with a right-sided L2-3 microdiscectomy with L2-3 posterior lateral fusion with instrumentation utilizing dynamic rods.  Past Medical History  Diagnosis Date  . Hypertension     states she always has palpitations   . Shortness of breath   . Arthritis     HNP- low back   . Complication of anesthesia     pt. reports that when she wakes up she had a hard time catching her breath & the PACU team told her that her BP & pulse were not doing what it was suppose to do  . PONV (postoperative nausea and vomiting)     Past Surgical History  Procedure Laterality Date  . Rotator cuff repair      both shoulders done, last one - 2010  . Foot tendon surgery  1985  . Abdominal hysterectomy      1993  . Finger mass excision  2008  . Back surgery      2000,01,10  . Tonsillectomy    . Inner ear surgery      x2    History reviewed. No pertinent family history. Social History:  reports that she has never smoked. She does not have any smokeless tobacco history on file. She reports that she does not drink alcohol or use illicit drugs.  Allergies:  Allergies  Allergen Reactions  . Shrimp (Shellfish Allergy) Hives    Medications Prior to Admission  Medication Sig Dispense Refill  . atenolol (TENORMIN) 50 MG tablet Take 50 mg by mouth daily with breakfast.      . CALCIUM-VITAMIN D PO Take 1 tablet by mouth 2 (two) times daily.      . diphenhydrAMINE (BENADRYL) 25 mg capsule Take 25 mg by mouth daily as needed for allergies.      . fluticasone  (FLONASE) 50 MCG/ACT nasal spray Place 2 sprays into the nose daily with breakfast.      . hydroxypropyl methylcellulose (ISOPTO TEARS) 2.5 % ophthalmic solution Place 2 drops into both eyes every morning.      Marland Kitchen lisinopril-hydrochlorothiazide (PRINZIDE,ZESTORETIC) 20-12.5 MG per tablet Take 1 tablet by mouth daily with breakfast.      . Multiple Vitamin (MULTIVITAMIN WITH MINERALS) TABS Take 1 tablet by mouth daily.      . naproxen sodium (ANAPROX) 220 MG tablet Take 440 mg by mouth daily as needed (back pain).      . Omega-3 Fatty Acids (FISH OIL) 1200 MG CPDR Take 1 capsule by mouth 2 (two) times daily.      . simvastatin (ZOCOR) 40 MG tablet Take 40 mg by mouth at bedtime.        Results for orders placed during the hospital encounter of 06/30/12 (from the past 48 hour(s))  GLUCOSE, CAPILLARY     Status: Abnormal   Collection Time    06/30/12  6:10 AM      Result Value Range   Glucose-Capillary 100 (*) 70 - 99 mg/dL   No results found.  Review of Systems  Constitutional: Negative.   HENT: Negative.   Eyes: Negative.   Respiratory:  Negative.   Cardiovascular: Negative.   Gastrointestinal: Negative.   Genitourinary: Negative.   Musculoskeletal: Negative.   Skin: Negative.   Neurological: Negative.   Endo/Heme/Allergies: Negative.   Psychiatric/Behavioral: Negative.     Blood pressure 162/72, pulse 59, temperature 98 F (36.7 C), temperature source Oral, resp. rate 20, SpO2 98.00%. Physical Exam  Constitutional: She is oriented to person, place, and time. She appears well-developed and well-nourished. No distress.  HENT:  Head: Normocephalic and atraumatic.  Right Ear: External ear normal.  Left Ear: External ear normal.  Nose: Nose normal.  Mouth/Throat: No oropharyngeal exudate.  Eyes: Conjunctivae and EOM are normal. Pupils are equal, round, and reactive to light. Right eye exhibits no discharge. Left eye exhibits no discharge.  Neck: Normal range of motion. Neck  supple. No tracheal deviation present. No thyromegaly present.  Cardiovascular: Normal rate, regular rhythm, normal heart sounds and intact distal pulses.  Exam reveals no friction rub.   No murmur heard. Respiratory: Effort normal and breath sounds normal. No respiratory distress. She has no wheezes. She has no rales.  GI: Soft. Bowel sounds are normal. She exhibits no distension. There is no tenderness.  Musculoskeletal: Normal range of motion. She exhibits no edema and no tenderness.  Neurological: She is alert and oriented to person, place, and time. She has normal reflexes. She displays normal reflexes. No cranial nerve deficit. She exhibits normal muscle tone. Coordination normal.  Skin: Skin is warm and dry. No rash noted. She is not diaphoretic. No erythema. No pallor.  Psychiatric: She has a normal mood and affect. Her behavior is normal. Judgment and thought content normal.     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 Plan right L2-3 redo laminotomy and microdiscectomy and L2-3 posterior lateral arthrodesis utilizing local autograft and dynamic rod instrumentation. Plan removal of L3-L4 hardware. Risks and benefits have been explained. Patient wishes to proceed.  Marykay Mccleod A 06/30/2012, 7:43 AM

## 2012-06-30 NOTE — Anesthesia Procedure Notes (Signed)
Procedure Name: Intubation Date/Time: 06/30/2012 7:49 AM Performed by: Alanda Amass A Pre-anesthesia Checklist: Patient identified, Timeout performed, Emergency Drugs available, Suction available and Patient being monitored Patient Re-evaluated:Patient Re-evaluated prior to inductionOxygen Delivery Method: Circle system utilized Preoxygenation: Pre-oxygenation with 100% oxygen Intubation Type: IV induction Ventilation: Mask ventilation without difficulty and Oral airway inserted - appropriate to patient size Laryngoscope Size: Mac and 3 Grade View: Grade I Tube type: Oral Tube size: 7.5 mm Number of attempts: 1 Airway Equipment and Method: Stylet Placement Confirmation: ETT inserted through vocal cords under direct vision,  breath sounds checked- equal and bilateral and positive ETCO2 Secured at: 20 cm Tube secured with: Tape Dental Injury: Teeth and Oropharynx as per pre-operative assessment

## 2012-06-30 NOTE — Anesthesia Preprocedure Evaluation (Addendum)
Anesthesia Evaluation  Patient identified by MRN, date of birth, ID band Patient awake    Reviewed: Allergy & Precautions, H&P , NPO status , Patient's Chart, lab work & pertinent test results, reviewed documented beta blocker date and time   History of Anesthesia Complications (+) PONV  Airway Mallampati: I TM Distance: >3 FB Neck ROM: Full    Dental  (+) Edentulous Upper and Dental Advisory Given   Pulmonary shortness of breath and with exertion,  breath sounds clear to auscultation        Cardiovascular hypertension, Pt. on home beta blockers and Pt. on medications Rhythm:Regular Rate:Normal     Neuro/Psych    GI/Hepatic Neg liver ROS,   Endo/Other  negative endocrine ROS  Renal/GU negative Renal ROS     Musculoskeletal   Abdominal   Peds  Hematology negative hematology ROS (+)   Anesthesia Other Findings   Reproductive/Obstetrics                         Anesthesia Physical Anesthesia Plan  ASA: III  Anesthesia Plan: General   Post-op Pain Management:    Induction: Intravenous  Airway Management Planned: Oral ETT  Additional Equipment:   Intra-op Plan:   Post-operative Plan: Extubation in OR  Informed Consent: I have reviewed the patients History and Physical, chart, labs and discussed the procedure including the risks, benefits and alternatives for the proposed anesthesia with the patient or authorized representative who has indicated his/her understanding and acceptance.   Dental advisory given  Plan Discussed with: CRNA, Anesthesiologist and Surgeon  Anesthesia Plan Comments:         Anesthesia Quick Evaluation

## 2012-06-30 NOTE — Transfer of Care (Signed)
Immediate Anesthesia Transfer of Care Note  Patient: Carol Gamble  Procedure(s) Performed: Procedure(s) with comments: Right Lumbar two-three microdiskectomy, lumbar two-three posterior lateral arthrodesis with instrumentation. (N/A) - Right Lumbar two-three microdiskectomy, lumbar two-three posterior lateral arthrodesis with instrumentation.  Patient Location: PACU  Anesthesia Type:General  Level of Consciousness: sedated  Airway & Oxygen Therapy: Patient Spontanous Breathing and Patient connected to nasal cannula oxygen  Post-op Assessment: Report given to PACU RN and Post -op Vital signs reviewed and stable  Post vital signs: Reviewed and stable  Complications: No apparent anesthesia complications

## 2012-06-30 NOTE — Anesthesia Postprocedure Evaluation (Signed)
  Anesthesia Post-op Note  Patient: Carol Gamble  Procedure(s) Performed: Procedure(s) with comments: Right Lumbar two-three microdiskectomy, lumbar two-three posterior lateral arthrodesis with instrumentation. (N/A) - Right Lumbar two-three microdiskectomy, lumbar two-three posterior lateral arthrodesis with instrumentation.  Patient Location: PACU  Anesthesia Type:General  Level of Consciousness: awake  Airway and Oxygen Therapy: Patient Spontanous Breathing  Post-op Pain: mild  Post-op Assessment: Post-op Vital signs reviewed  Post-op Vital Signs: Reviewed  Complications: No apparent anesthesia complications

## 2012-06-30 NOTE — Brief Op Note (Signed)
06/30/2012  10:07 AM  PATIENT:  Carol Gamble  72 y.o. female  PRE-OPERATIVE DIAGNOSIS:  Herniated Nucleus Pulposus  POST-OPERATIVE DIAGNOSIS:  Herniated Nucleus Pulposus  PROCEDURE:  Procedure(s) with comments: Right Lumbar two-three microdiskectomy, lumbar two-three posterior lateral arthrodesis with instrumentation. (N/A) - Right Lumbar two-three microdiskectomy, lumbar two-three posterior lateral arthrodesis with instrumentation.  SURGEON:  Surgeon(s) and Role:    * Temple Pacini, MD - Primary    * Hewitt Shorts, MD - Assisting  PHYSICIAN ASSISTANT:   ASSISTANTS:    ANESTHESIA:   general  EBL:  Total I/O In: 2000 [I.V.:2000] Out: 105 [Urine:105]  BLOOD ADMINISTERED:none  DRAINS: (Medium) Hemovact drain(s) in the Epidural space with  Suction Open   LOCAL MEDICATIONS USED:  MARCAINE     SPECIMEN:  No Specimen  DISPOSITION OF SPECIMEN:  N/A  COUNTS:  YES  TOURNIQUET:  * No tourniquets in log *  DICTATION: .Dragon Dictation  PLAN OF CARE: Admit to inpatient   PATIENT DISPOSITION:  PACU - hemodynamically stable.   Delay start of Pharmacological VTE agent (>24hrs) due to surgical blood loss or risk of bleeding: yes

## 2012-06-30 NOTE — Op Note (Signed)
Date of procedure: 06/30/2012  Date of dictation: Same  Service: Neurosurgery  Preoperative diagnosis: Right L2-3 herniated nuclear pulposus with radiculopathy status post L3-4-5 posterior lumbar fusion with instrumentation  Postoperative diagnosis: Same  Procedure Name: Right L2-3 reexploration of laminotomy with redo microdiscectomy.  L2-3 posterior lateral arthrodesis utilizing local autograft and globus dynamic rod instrumentation (nonsegmental)  Removal of L3-4 instrumentation.  Surgeon:Floria Brandau A.Jadelynn Boylan, M.D.  Asst. Surgeon: Newell Coral  Anesthesia: General  Indication: 72 year old female status post previous L3-4 and L4-5 decompression and fusion with instrumentation. Patient presents now with acute right lower extremity pain paresthesias and weakness consistent primarily with a right-sided L3 radiculopathy. Workup demonstrates evidence of a large right L2-3 superior disc herniation with marked compression of thecal sac and compression of the right L2 and L3 nerve root. Fusion appears solid at L3-4 and L4-5. I discussed options with the patient including decompression and interbody fusion at the L2-3 level versus decompression and dynamic rod fusion at the L 23 level. She is decided proceed with dynamic stabilization and fusion at L2-3 in hopes of improving her symptoms but lessening her chance of further adjacent level breakdown.  Operative note: After induction of anesthesia, patient positioned prone onto Wilson frame and appropriately padded. Lumbar region prepped and draped sterilely. Incision made overlying the L2-L4 level. Supper off dissection performed bilaterally. Retractor placed intraoperative fluoroscopy used levels confirmed. Previously placed screw instrumentation at L3-4 was disassembled and removed. Fusion L3-4 was inspected and found to be quite solid. Laminotomy on the right side at L2-3 was then performed using high-speed drill and Kerrison rongeurs to remove the inferior  aspect of lamina of L2 medial aspect the L2-3 facet joint. Ligament flavum and epidural scar were elevated and resected. Underlying thecal sac and exiting L3 and L2 nerve roots were notified. Microscope brought in for microdissection. Thecal sac and L3 nerve root gently mobilized retracted towards midline. The space and size 15 blade in a rectangular fashion. Wide disc space cleanout was achieved using pituitary rongeurs operative of rongeurs and Epstein curettes. All meds of the disc herniation were removed including a large superior fragment. At this point a very thorough discectomy been achieved. There is no is any further compression upon the thecal sac or exiting nerve roots. Gelfoam was placed topically. Pedicles of L2 and L3 were then identified using surface landmarks and intraoperative fluoroscopy. Superficial bone when the pedicles and removed using high-speed drill the pedicles and probed using pedicle awl each pedicle awl track was then probed using a blunt probe and found to be solidly within bone. 6.5 x 40 mm globus medical screws were placed at L2 and L3. Transverse processes of L2 and L3 were then decorticated using high-speed drill. Morselized autograft saved from the laminotomy was then packed posterior laterally for later fusion. A 22 mm globus medical dynamic rod system and was placed over the screw heads at L2 and L3. This is an locked in place by securing the locking caps. Locking catching a final tightening. Final images revealed good position the bone graft and hardware at proper upper level with normal lamina spine. Wound is then irrigated out like solution. A medium Hemovac drain was left at per space. Wounds and close in layers with Vicryl sutures. Steri-Strips and sterile dressing were applied. There were no apparent locations. Patient tolerated the procedure well and she returns to the recovery room.

## 2012-06-30 NOTE — Progress Notes (Signed)
UR COMPLETED  

## 2012-07-01 MED ORDER — METHOCARBAMOL 500 MG PO TABS: 500.0000 mg | ORAL_TABLET | Freq: Four times a day (QID) | ORAL | Status: DC

## 2012-07-01 MED ORDER — OXYCODONE-ACETAMINOPHEN 5-325 MG PO TABS: 1.0000 | ORAL_TABLET | ORAL | Status: DC | PRN

## 2012-07-01 NOTE — Evaluation (Signed)
Occupational Therapy Evaluation Patient Details Name: Carol Gamble MRN: [id number] DOB: November 27, 1940 Today's Date: 07/01/2012 Time: 1308-6578 OT Time Calculation (min): 25 min  OT Assessment / Plan / Recommendation Clinical Impression  Pt s/p Right Lumbar two-three microdiskectomy, lumbar two-three posterior lateral arthrodesis with instrumentation. Hx of previous back sx.  Pt is at overall supervision level with min assist for LB ADL tasks. Pt has necessary level of assist at home and no DME needs. Education completed. Will sign off.    OT Assessment  Patient does not need any further OT services    Follow Up Recommendations  No OT follow up;Supervision/Assistance - 24 hour    Barriers to Discharge      Equipment Recommendations  None recommended by OT    Recommendations for Other Services    Frequency       Precautions / Restrictions Precautions Precautions: Back Precaution Booklet Issued: No Precaution Comments: Pt able to verbalize 3/3 back precautions.  We reviewed brace use, log roll, and lifting restrictions.   Required Braces or Orthoses: Spinal Brace Spinal Brace: Lumbar corset;Applied in sitting position   Pertinent Vitals/Pain See vitals    ADL  Grooming: Performed;Brushing hair;Supervision/safety Where Assessed - Grooming: Unsupported standing Upper Body Bathing: Simulated;Set up Where Assessed - Upper Body Bathing: Unsupported sitting Lower Body Bathing: Simulated;Minimal assistance Where Assessed - Lower Body Bathing: Supported sit to stand Upper Body Dressing: Performed;Set up Where Assessed - Upper Body Dressing: Unsupported sitting Lower Body Dressing: Simulated;Minimal assistance Where Assessed - Lower Body Dressing: Supported sit to stand Toilet Transfer: Buyer, retail Method: Sit to Barista:  (bed then ambulated in hall and returned to chair) Equipment Used: Rolling walker Transfers/Ambulation  Related to ADLs: supervision with RW ADL Comments: Pt reports her husband can assist with LB ADLs as needed (he has after past back sx).  Pt demo'd good understanding of back precautions.    OT Diagnosis:    OT Problem List:   OT Treatment Interventions:     OT Goals    Visit Information  Last OT Received On: 07/01/12 Assistance Needed: +1 PT/OT Co-Evaluation/Treatment: Yes    Subjective Data      Prior Functioning     Home Living Lives With: Spouse Available Help at Discharge: Family;Available 24 hours/day Type of Home: House Home Access: Stairs to enter [employer] of Steps: 2 Entrance Stairs-Rails: None Home Layout: One level Bathroom Shower/Tub: Health visitor: Handicapped height Home Adaptive Equipment: Walker - rolling;Other (comment);Reacher;Sock aid (uses a folding chair in the shower) Prior Function Level of Independence: Independent Able to Take Stairs?: Yes Comments: PTA pt was having pain, but still doing everything for herself.  Communication Communication: No difficulties         Vision/Perception     Cognition  Cognition Arousal/Alertness: Awake/alert Behavior During Therapy: WFL for tasks assessed/performed Overall Cognitive Status: Within Functional Limits for tasks assessed    Extremity/Trunk Assessment Right Upper Extremity Assessment RUE ROM/Strength/Tone: [facility] for tasks assessed Left Upper Extremity Assessment LUE ROM/Strength/Tone: WFL for tasks assessed Right Lower Extremity Assessment RLE ROM/Strength/Tone: [facility] for tasks assessed Left Lower Extremity Assessment LLE ROM/Strength/Tone: WFL for tasks assessed LLE Sensation: WFL - Light Touch (per pt report this was the leg that was numb PTA.  None now)     Mobility Bed Mobility Bed Mobility: Rolling Right;Right Sidelying to Sit;Sitting - Scoot to Edge of Bed;Sit to Sidelying Right Rolling Right: 6: Modified independent (Device/Increase time) Right  Sidelying to Sit:  6: Modified independent (Device/Increase time);HOB flat Sitting - Scoot to Edge of Bed: 6: Modified independent (Device/Increase time) Sit to Sidelying Right: HOB flat;4: Min assist Details for Bed Mobility Assistance: extra time needed and verbal cues for log roll and reverse log roll technique to get into and out of bed.  Minimal assist needed to help legs back into the bed (stool under feet to simulate home environment).  Education provided re: positioning for sleeping (side sleeper- use pillows between her knees and behind her for comfort).   Transfers Transfers: Sit to Stand;Stand to Sit Sit to Stand: 5: Supervision;From bed;With upper extremity assist Stand to Sit: 5: Supervision;To chair/3-in-1;With armrests;With upper extremity assist;To bed Details for Transfer Assistance: supervision for safety due to her first time up     Exercise    Balance Balance Balance Assessed: Yes Static Sitting Balance Static Sitting - Balance Support: No upper extremity supported;Feet supported Static Sitting - Level of Assistance: 7: Independent   End of Session OT - End of Session Equipment Utilized During Treatment:  (RW) Activity Tolerance: Patient tolerated treatment well Patient left: in chair;with call bell/phone within reach;with family/visitor present Nurse Communication: Mobility status  GO    07/01/2012 Cipriano Mile OTR/L Pager [phone] Office [phone]  Cipriano Mile 07/01/2012, 1:33 PM

## 2012-07-01 NOTE — Evaluation (Signed)
Physical Therapy Evaluation Patient Details Name: Carol Gamble MRN: [id number] DOB: 12-17-1940 Today's Date: 07/01/2012 Time: 7829-5621 PT Time Calculation (min): 20 min  PT Assessment / Plan / Recommendation Clinical Impression  72 y.o. female admitted to [facility] for L2-3 athrodesis.  She has significant PMH of arthrodesis from L3-L5 previously ( 3 years ago per pt).  She presents today (POD #1) with great mobility, requiring up to min assist with bed mobility and on the stiars, but husband is available to provide this assistance.  Pt does not need any equipment or PT f/u at this time.  She is due to discharge home later this afternoon with family's 24/7 assist.      PT Assessment  Patent does not need any further PT services    Follow Up Recommendations  No PT follow up    Does the patient have the potential to tolerate intense rehabilitation     NA  Barriers to Discharge   none      Equipment Recommendations  None recommended by PT    Recommendations for Other Services   none     Precautions / Restrictions Precautions Precautions: Back Precaution Booklet Issued: No Precaution Comments: Pt able to verbalize 3/3 back precautions.  We reviewed brace use, log roll, and lifting restrictions.   Required Braces or Orthoses: Spinal Brace Spinal Brace: Lumbar corset;Applied in sitting position Restrictions Weight Bearing Restrictions: No   Pertinent Vitals/Pain See vitals flow sheet.      Mobility  Bed Mobility Bed Mobility: Rolling Right;Right Sidelying to Sit;Sitting - Scoot to Edge of Bed;Sit to Sidelying Right Rolling Right: 6: Modified independent (Device/Increase time) Right Sidelying to Sit: 6: Modified independent (Device/Increase time);HOB flat Sitting - Scoot to Edge of Bed: 6: Modified independent (Device/Increase time) Sit to Sidelying Right: HOB flat;4: Min assist Details for Bed Mobility Assistance: extra time needed and verbal cues for log roll and reverse log  roll technique to get into and out of bed.  Minimal assist needed to help legs back into the bed (stool under feet to simulate home environment).  Education provided re: positioning for sleeping (side sleeper- use pillows between her knees and behind her for comfort).   Transfers Transfers: Sit to Stand;Stand to Sit Sit to Stand: 5: Supervision;With upper extremity assist;From bed Stand to Sit: 5: Supervision;With upper extremity assist;With armrests;To bed;To chair/3-in-1 Details for Transfer Assistance: supervision for safety due to her first time up Ambulation/Gait Ambulation/Gait Assistance: 6: Modified independent (Device/Increase time) Ambulation Distance (Feet): 200 Feet Assistive device: Rolling walker Ambulation/Gait Assistance Details: No verbal cues needed for technique.  Pt ambulating at a safe speed.   Gait Pattern: Within Functional Limits Stairs: Yes Stairs Assistance: 4: Min assist Stairs Assistance Details (indicate cue type and reason): min assist to stabilize and move RW up/down stairs.  Simulated like she does it at home.   Stair Management Technique: No rails;Backwards;With walker Number of Stairs: 2    Exercises Other Exercises Other Exercises: Educated pt re: primary form of exercise should be walking with RW, starting inside the first few days and progressing to outside during the cooler hours of the day.  We also spoke about trying not to be in a static sitting position for more than about 30-45 mins if she could avoid it to help decrease pain and stiffness.     PT Goals    Visit Information  Last PT Received On: 07/01/12 Assistance Needed: +1 PT/OT Co-Evaluation/Treatment: Yes    Subjective Data  Subjective:  Pt reports she has known her husband since they were 8 y.o.   This is her second back surgery.  The other one was 3 years ago.   Patient Stated Goal: to go home today.  Not ever have another back surgery.     Prior Functioning  Home Living Lives With:  Spouse Available Help at Discharge: Family;Available 24 hours/day Type of Home: House Home Access: Stairs to enter [employer] of Steps: 2 Entrance Stairs-Rails: None Home Layout: One level Home Adaptive Equipment: Walker - rolling;Other (comment);Reacher (uses a folding chair in the shower) Prior Function Level of Independence: Independent Able to Take Stairs?: Yes Comments: PTA pt was having pain, but still doing everything for herself.  Communication Communication: No difficulties    Cognition  Cognition Arousal/Alertness: Awake/alert Behavior During Therapy: WFL for tasks assessed/performed Overall Cognitive Status: Within Functional Limits for tasks assessed    Extremity/Trunk Assessment Right Lower Extremity Assessment RLE ROM/Strength/Tone: [facility] for tasks assessed Left Lower Extremity Assessment LLE ROM/Strength/Tone: WFL for tasks assessed LLE Sensation: WFL - Light Touch (per pt report this was the leg that was numb PTA.  None now)   Balance Balance Balance Assessed: Yes Static Sitting Balance Static Sitting - Balance Support: No upper extremity supported;Feet supported Static Sitting - Level of Assistance: 7: Independent  End of Session PT - End of Session Equipment Utilized During Treatment: Back brace Activity Tolerance: Patient tolerated treatment well Patient left: in chair;with call bell/phone within reach;with family/visitor present (husband in room )    Carol Gamble, PT, DPT [phone]   07/01/2012, 11:27 AM

## 2012-07-01 NOTE — Discharge Summary (Signed)
Physician Discharge Summary  Patient ID: Carol Gamble MRN: [id number] DOB/AGE: [birth year] 72 y.o.  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  Consults: None  Significant Diagnostic Studies: None  Treatments: Microdiscectomy L2-3 right decompression of L2 and L3 nerve roots posterior lumbar interbody arthrodesis nonsegmental fixation L2-3  Discharge Exam: Blood pressure 136/65, pulse 57, temperature 98.1 F (36.7 C), temperature source Oral, resp. rate 20, height 5\' 3"  (1.6 m), weight 98.901 kg (218 lb 0.6 oz), SpO2 99.00%. Incision is clean and dry motor function is intact in lower extremities.  Disposition:  discharge home  Discharge Orders   Future Appointments Provider Department Dept Phone   08/28/2012 11:45 AM Vevelyn Royals, [place] [facility] [phone]   Future Orders Complete By Expires     Call MD for:  redness, tenderness, or signs of infection (pain, swelling, redness, odor or green/yellow discharge around incision site)  As directed     Call MD for:  severe uncontrolled pain  As directed     Call MD for:  temperature >100.4  As directed     Diet - low sodium heart healthy  As directed     Increase activity slowly  As directed         Medication List    TAKE these medications       atenolol 50 MG tablet  Commonly known as:  TENORMIN  Take 50 mg by mouth daily with breakfast.     CALCIUM-VITAMIN D PO  Take 1 tablet by mouth 2 (two)  times daily.     diphenhydrAMINE 25 mg capsule  Commonly known as:  BENADRYL  Take 25 mg by mouth daily as needed for allergies.     Fish Oil 1200 MG Cpdr  Take 1 capsule by mouth 2 (two) times daily.     fluticasone 50 MCG/ACT nasal spray  Commonly known as:  FLONASE  Place 2 sprays into the nose daily with breakfast.     hydroxypropyl methylcellulose 2.5 % ophthalmic solution  Commonly known as:  ISOPTO TEARS  Place 2 drops into both eyes every morning.     lisinopril-hydrochlorothiazide 20-12.5 MG per tablet  Commonly known as:  PRINZIDE,ZESTORETIC  Take 1 tablet by mouth daily with breakfast.     methocarbamol 500 MG tablet  Commonly known as:  ROBAXIN  Take 1 tablet (500 mg total) by mouth 4 (four) times daily.     multivitamin with minerals Tabs  Take 1 tablet by mouth daily.     naproxen sodium 220 MG tablet  Commonly known as:  ANAPROX  Take 440 mg by mouth daily as needed (back pain).     oxyCODONE-acetaminophen 5-325 MG per tablet  Commonly known as:  PERCOCET/ROXICET  Take 1-2 tablets by mouth every 4 (four) hours as needed for pain.     simvastatin 40 MG tablet  Commonly known as:  ZOCOR  Take 40 mg by mouth at bedtime.         SignedStefani Dama 07/01/2012, 9:58 AM

## 2012-07-03 MED FILL — Heparin Sodium (Porcine) Inj 1000 Unit/ML: INTRAMUSCULAR | Qty: 30 | Status: AC

## 2012-07-03 MED FILL — Sodium Chloride Irrigation Soln 0.9%: Qty: 3000 | Status: AC

## 2012-07-03 MED FILL — Sodium Chloride IV Soln 0.9%: INTRAVENOUS | Qty: 1000 | Status: AC

## 2012-07-03 NOTE — Progress Notes (Signed)
07/03/2012 1200 NCM reviewed PT/OT notes. No follow up recommended. Isidoro Donning RN CCM Case Mgmt phone [phone]

## 2012-08-28 ENCOUNTER — Encounter: Payer: Self-pay | Admitting: *Deleted

## 2012-08-28 ENCOUNTER — Encounter: Payer: Medicare Other | Attending: Family Medicine | Admitting: *Deleted

## 2012-08-28 DIAGNOSIS — E785 Hyperlipidemia, unspecified: Secondary | ICD-10-CM | POA: Insufficient documentation

## 2012-08-28 DIAGNOSIS — R7303 Prediabetes: Secondary | ICD-10-CM

## 2012-08-28 DIAGNOSIS — I10 Essential (primary) hypertension: Secondary | ICD-10-CM | POA: Insufficient documentation

## 2012-08-28 DIAGNOSIS — R7309 Other abnormal glucose: Secondary | ICD-10-CM | POA: Insufficient documentation

## 2012-08-28 DIAGNOSIS — Z713 Dietary counseling and surveillance: Secondary | ICD-10-CM | POA: Insufficient documentation

## 2012-08-28 NOTE — Patient Instructions (Signed)
Plan:  Aim for 2-3 Carb Choices per meal (30-45 grams)   Aim for 0-1 Carbs per snack if hungry  Consider reading food labels for Total Carbohydrate of foods Consider ways of  increasing your activity level daily as tolerated

## 2012-08-28 NOTE — Progress Notes (Signed)
  Medical Nutrition Therapy:  Appt start time: 1145 end time:  1245.  Assessment:  Primary concerns today: patient here for pre-diabetes. States she has had multiple back surgeries over past 10 years. Lives with husband, she shops and prepares the meals. Activity limited due to recent back surgery, walks daily either outside or at [facility] if too hot. Retired from [employer], loves to crochet and sewing, has grand kids locally too.   MEDICATIONS: see list   DIETARY INTAKE:  Usual eating pattern includes 3 meals and 1-2 snacks per day.  Everyday foods include good variety of all food groups.  Avoided foods include high calorie foods.    24-hr recall:  B ( AM): cereal OR sausage, eggs and tomatoes OR pancake, coffee with Splenda  Snk ( AM): occasionally breakfast bar ([employer])  L ( PM): sandwich with chips, water or green tea Snk ( PM): fresh fruit or peanuts if hungry D ( PM): eat out often: meat, starch, vegetable, occasionally dessert too. Water or diet soda Snk ( PM): none Beverages: Water or diet soda, coffee  Usual physical activity: tries to walk daily  Estimated energy needs: 1400 calories 158 g carbohydrates 105 g protein 39 g fat  Progress Towards Goal(s):  In progress.   Nutritional Diagnosis:  NI-1.5 Excessive energy intake As related to activity level.  As evidenced by BMI of 39.1.    Intervention:  Nutrition counseling for weight loss initiated. DiscussedCarb Counting ad method of portion control, reading food labels, and benefits of increased activity. . Plan:  Aim for 2-3 Carb Choices per meal (30-45 grams)   Aim for 0-1 Carbs per snack if hungry  Consider reading food labels for Total Carbohydrate of foods Consider ways of  increasing your activity level daily as tolerated  Handouts given during visit include: Carb Counting and Food Label handouts Meal Plan Card  Arm Chair exercise handout  Monitoring/Evaluation:  Dietary intake, exercise, reading  food labels, and body weight prn.

## 2013-03-01 ENCOUNTER — Other Ambulatory Visit: Payer: Self-pay | Admitting: Urology

## 2013-03-01 ENCOUNTER — Ambulatory Visit (HOSPITAL_COMMUNITY)
Admission: AD | Admit: 2013-03-01 | Discharge: 2013-03-01 | Disposition: A | Discharge status: Home / Self Care | Payer: Medicare Other | Source: Ambulatory Visit | Attending: Urology | Admitting: Urology

## 2013-03-01 ENCOUNTER — Encounter (HOSPITAL_COMMUNITY)
Admission: AD | Disposition: A | Discharge status: Home / Self Care | Payer: Self-pay | Source: Ambulatory Visit | Attending: Urology

## 2013-03-01 ENCOUNTER — Ambulatory Visit (HOSPITAL_COMMUNITY): Payer: Medicare Other | Admitting: Anesthesiology

## 2013-03-01 ENCOUNTER — Encounter (HOSPITAL_COMMUNITY): Payer: Medicare Other | Admitting: Anesthesiology

## 2013-03-01 ENCOUNTER — Encounter (HOSPITAL_COMMUNITY): Admission: RE | Payer: Self-pay | Source: Ambulatory Visit

## 2013-03-01 ENCOUNTER — Ambulatory Visit (HOSPITAL_COMMUNITY): Admission: RE | Admit: 2013-03-01 | Payer: Medicare Other | Source: Ambulatory Visit | Admitting: Urology

## 2013-03-01 ENCOUNTER — Encounter (HOSPITAL_COMMUNITY): Payer: Self-pay | Admitting: Emergency Medicine

## 2013-03-01 ENCOUNTER — Emergency Department (HOSPITAL_COMMUNITY)
Admission: EM | Admit: 2013-03-01 | Discharge: 2013-03-01 | Disposition: A | Discharge status: Home / Self Care | Payer: Medicare Other | Attending: Emergency Medicine | Admitting: Emergency Medicine

## 2013-03-01 ENCOUNTER — Emergency Department (HOSPITAL_COMMUNITY): Payer: Medicare Other

## 2013-03-01 ENCOUNTER — Encounter (HOSPITAL_COMMUNITY): Payer: Self-pay | Admitting: Anesthesiology

## 2013-03-01 DIAGNOSIS — N1330 Unspecified hydronephrosis: Secondary | ICD-10-CM | POA: Insufficient documentation

## 2013-03-01 DIAGNOSIS — IMO0002 Reserved for concepts with insufficient information to code with codable children: Secondary | ICD-10-CM | POA: Insufficient documentation

## 2013-03-01 DIAGNOSIS — Z792 Long term (current) use of antibiotics: Secondary | ICD-10-CM | POA: Insufficient documentation

## 2013-03-01 DIAGNOSIS — Z79899 Other long term (current) drug therapy: Secondary | ICD-10-CM | POA: Insufficient documentation

## 2013-03-01 DIAGNOSIS — Z90710 Acquired absence of both cervix and uterus: Secondary | ICD-10-CM | POA: Insufficient documentation

## 2013-03-01 DIAGNOSIS — Z91013 Allergy to seafood: Secondary | ICD-10-CM | POA: Insufficient documentation

## 2013-03-01 DIAGNOSIS — I10 Essential (primary) hypertension: Secondary | ICD-10-CM | POA: Insufficient documentation

## 2013-03-01 DIAGNOSIS — E785 Hyperlipidemia, unspecified: Secondary | ICD-10-CM | POA: Insufficient documentation

## 2013-03-01 DIAGNOSIS — N201 Calculus of ureter: Secondary | ICD-10-CM | POA: Insufficient documentation

## 2013-03-01 DIAGNOSIS — N200 Calculus of kidney: Secondary | ICD-10-CM

## 2013-03-01 DIAGNOSIS — M129 Arthropathy, unspecified: Secondary | ICD-10-CM | POA: Insufficient documentation

## 2013-03-01 HISTORY — PX: HOLMIUM LASER APPLICATION: SHX5852

## 2013-03-01 HISTORY — DX: Personal history of other diseases of the respiratory system: Z87.09

## 2013-03-01 HISTORY — DX: Headache: R51

## 2013-03-01 HISTORY — PX: CYSTOSCOPY/RETROGRADE/URETEROSCOPY/STONE EXTRACTION WITH BASKET: SHX5317

## 2013-03-01 HISTORY — DX: Personal history of urinary calculi: Z87.442

## 2013-03-01 LAB — COMPREHENSIVE METABOLIC PANEL
ALT: 13 U/L (ref 0–35)
AST: 13 U/L (ref 0–37)
Albumin: 3.4 g/dL — ABNORMAL LOW (ref 3.5–5.2)
Alkaline Phosphatase: 57 U/L (ref 39–117)
BUN: 19 mg/dL (ref 6–23)
CALCIUM: 9.3 mg/dL (ref 8.4–10.5)
CO2: 25 meq/L (ref 19–32)
CREATININE: 1.32 mg/dL — AB (ref 0.50–1.10)
Chloride: 100 mEq/L (ref 96–112)
GFR, EST AFRICAN AMERICAN: 46 mL/min — AB (ref >90)
GFR, EST NON AFRICAN AMERICAN: 40 mL/min — AB (ref >90)
Glucose, Bld: 126 mg/dL — ABNORMAL HIGH (ref 70–99)
Potassium: 3.6 mEq/L — ABNORMAL LOW (ref 3.7–5.3)
Sodium: 140 mEq/L (ref 137–147)
TOTAL PROTEIN: 6.8 g/dL (ref 6.0–8.3)
Total Bilirubin: 0.6 mg/dL (ref 0.3–1.2)

## 2013-03-01 LAB — CBC WITH DIFFERENTIAL/PLATELET
BASOS PCT: 0 % (ref 0–1)
Basophils Absolute: 0 10*3/uL (ref 0.0–0.1)
Eosinophils Absolute: 0.1 10*3/uL (ref 0.0–0.7)
Eosinophils Relative: 1 % (ref 0–5)
HCT: 38.9 % (ref 36.0–46.0)
Hemoglobin: 13.5 g/dL (ref 12.0–15.0)
LYMPHS PCT: 11 % — AB (ref 12–46)
Lymphs Abs: 1.3 10*3/uL (ref 0.7–4.0)
MCH: 30.5 pg (ref 26.0–34.0)
MCHC: 34.7 g/dL (ref 30.0–36.0)
MCV: 87.8 fL (ref 78.0–100.0)
MONOS PCT: 9 % (ref 3–12)
Monocytes Absolute: 1 10*3/uL (ref 0.1–1.0)
NEUTROS ABS: 9.7 10*3/uL — AB (ref 1.7–7.7)
Neutrophils Relative %: 80 % — ABNORMAL HIGH (ref 43–77)
Platelets: 182 10*3/uL (ref 150–400)
RBC: 4.43 MIL/uL (ref 3.87–5.11)
RDW: 13.9 % (ref 11.5–15.5)
WBC: 12.1 10*3/uL — AB (ref 4.0–10.5)

## 2013-03-01 LAB — URINALYSIS, ROUTINE W REFLEX MICROSCOPIC
GLUCOSE, UA: NEGATIVE mg/dL
Ketones, ur: 15 mg/dL — AB
Nitrite: POSITIVE — AB
Protein, ur: 30 mg/dL — AB
SPECIFIC GRAVITY, URINE: 1.036 — AB (ref 1.005–1.030)
Urobilinogen, UA: 0.2 mg/dL (ref 0.0–1.0)
pH: 5.5 (ref 5.0–8.0)

## 2013-03-01 LAB — URINE MICROSCOPIC-ADD ON

## 2013-03-01 LAB — LIPASE, BLOOD: Lipase: 15 U/L (ref 11–59)

## 2013-03-01 SURGERY — LITHOTRIPSY, ESWL
Anesthesia: LOCAL | Laterality: Left

## 2013-03-01 SURGERY — CYSTOSCOPY, WITH CALCULUS REMOVAL USING BASKET
Anesthesia: General | Site: Ureter | Laterality: Left

## 2013-03-01 MED ORDER — IOHEXOL 300 MG/ML  SOLN
INTRAMUSCULAR | Status: DC | PRN
  Administered 2013-03-01: 5 mL

## 2013-03-01 MED ORDER — BELLADONNA ALKALOIDS-OPIUM 16.2-60 MG RE SUPP
RECTAL | Status: AC
  Filled 2013-03-01: qty 1

## 2013-03-01 MED ORDER — CEFAZOLIN SODIUM-DEXTROSE 2-3 GM-% IV SOLR
2.0000 g | INTRAVENOUS | Status: AC
  Administered 2013-03-01: 2 g via INTRAVENOUS

## 2013-03-01 MED ORDER — FENTANYL CITRATE 0.05 MG/ML IJ SOLN
INTRAMUSCULAR | Status: DC | PRN
  Administered 2013-03-01: 100 ug via INTRAVENOUS

## 2013-03-01 MED ORDER — LACTATED RINGERS IV SOLN
INTRAVENOUS | Status: DC | PRN
  Administered 2013-03-01: 19:00:00
  Administered 2013-03-01: 17:00:00 via INTRAVENOUS

## 2013-03-01 MED ORDER — ONDANSETRON HCL 4 MG/2ML IJ SOLN
4.0000 mg | Freq: Once | INTRAMUSCULAR | Status: AC
  Administered 2013-03-01: 4 mg via INTRAVENOUS
  Filled 2013-03-01: qty 2

## 2013-03-01 MED ORDER — FENTANYL CITRATE 0.05 MG/ML IJ SOLN: 25.0000 ug | INTRAMUSCULAR | Status: DC | PRN

## 2013-03-01 MED ORDER — DEXAMETHASONE SODIUM PHOSPHATE 10 MG/ML IJ SOLN
INTRAMUSCULAR | Status: DC | PRN
  Administered 2013-03-01: 10 mg via INTRAVENOUS

## 2013-03-01 MED ORDER — KETOROLAC TROMETHAMINE 15 MG/ML IJ SOLN
INTRAMUSCULAR | Status: DC | PRN
Start: 2013-03-01 — End: 2013-03-01
  Administered 2013-03-01: 15 mg via INTRAVENOUS

## 2013-03-01 MED ORDER — LIDOCAINE HCL (CARDIAC) 20 MG/ML IV SOLN
INTRAVENOUS | Status: DC | PRN
  Administered 2013-03-01: 100 mg via INTRAVENOUS

## 2013-03-01 MED ORDER — SUCCINYLCHOLINE CHLORIDE 20 MG/ML IJ SOLN
INTRAMUSCULAR | Status: DC | PRN
  Administered 2013-03-01: 100 mg via INTRAVENOUS

## 2013-03-01 MED ORDER — SODIUM CHLORIDE 0.9 % IV BOLUS (SEPSIS)
1000.0000 mL | Freq: Once | INTRAVENOUS | Status: AC
  Administered 2013-03-01: 1000 mL via INTRAVENOUS

## 2013-03-01 MED ORDER — MORPHINE SULFATE 4 MG/ML IJ SOLN
4.0000 mg | Freq: Once | INTRAMUSCULAR | Status: AC
  Administered 2013-03-01: 4 mg via INTRAVENOUS
  Filled 2013-03-01: qty 1

## 2013-03-01 MED ORDER — PROPOFOL 10 MG/ML IV BOLUS
INTRAVENOUS | Status: DC | PRN
  Administered 2013-03-01: 200 mg via INTRAVENOUS

## 2013-03-01 MED ORDER — SODIUM CHLORIDE 0.9 % IR SOLN
Status: DC | PRN
  Administered 2013-03-01: 1000 mL

## 2013-03-01 MED ORDER — OXYCODONE-ACETAMINOPHEN 5-325 MG PO TABS: 2.0000 | ORAL_TABLET | ORAL | Status: DC | PRN

## 2013-03-01 MED ORDER — EPHEDRINE SULFATE 50 MG/ML IJ SOLN
INTRAMUSCULAR | Status: DC | PRN
  Administered 2013-03-01 (×3): 10 mg via INTRAVENOUS

## 2013-03-01 MED ORDER — SODIUM CHLORIDE 0.9 % IR SOLN
Status: DC | PRN
  Administered 2013-03-01: 3000 mL

## 2013-03-01 MED ORDER — ONDANSETRON HCL 4 MG/2ML IJ SOLN
INTRAMUSCULAR | Status: DC | PRN
  Administered 2013-03-01: 4 mg via INTRAVENOUS

## 2013-03-01 MED ORDER — 0.9 % SODIUM CHLORIDE (POUR BTL) OPTIME
TOPICAL | Status: DC | PRN
  Administered 2013-03-01: 1000 mL

## 2013-03-01 MED ORDER — HYDROCODONE-ACETAMINOPHEN 5-325 MG PO TABS: 1.0000 | ORAL_TABLET | Freq: Four times a day (QID) | ORAL | Status: DC | PRN

## 2013-03-01 MED ORDER — CEFAZOLIN SODIUM-DEXTROSE 2-3 GM-% IV SOLR
INTRAVENOUS | Status: AC
  Filled 2013-03-01: qty 50

## 2013-03-01 SURGICAL SUPPLY — 23 items
BAG URO CATCHER STRL LF (DRAPE) ×4 IMPLANT
BASKET ZERO TIP NITINOL 2.4FR (BASKET) ×8 IMPLANT
CATH INTERMIT  6FR 70CM (CATHETERS) IMPLANT
CATH URET 5FR 28IN CONE TIP (BALLOONS) ×1
CATH URET 5FR 70CM CONE TIP (BALLOONS) ×3 IMPLANT
CLOTH BEACON ORANGE TIMEOUT ST (SAFETY) ×4 IMPLANT
DRAPE CAMERA CLOSED 9X96 (DRAPES) ×4 IMPLANT
FIBER LASER FLEXIVA 365 (UROLOGICAL SUPPLIES) ×4 IMPLANT
GLOVE BIOGEL M 7.0 STRL (GLOVE) ×4 IMPLANT
GLOVE BIOGEL PI IND STRL 7.0 (GLOVE) ×3 IMPLANT
GLOVE BIOGEL PI INDICATOR 7.0 (GLOVE) ×1
GOWN STRL NON-REIN LRG LVL3 (GOWN DISPOSABLE) ×8 IMPLANT
GUIDEWIRE STR DUAL SENSOR (WIRE) ×4 IMPLANT
IV NS 1000ML (IV SOLUTION) ×1
IV NS 1000ML BAXH (IV SOLUTION) ×3 IMPLANT
IV NS IRRIG 3000ML ARTHROMATIC (IV SOLUTION) ×4 IMPLANT
MANIFOLD NEPTUNE II (INSTRUMENTS) ×4 IMPLANT
NS IRRIG 1000ML POUR BTL (IV SOLUTION) ×8 IMPLANT
PACK CYSTO (CUSTOM PROCEDURE TRAY) ×4 IMPLANT
SHEATH ACCESS URETERAL 54CM (SHEATH) ×4 IMPLANT
SHEATH URET ACCESS 12FR/35CM (UROLOGICAL SUPPLIES) ×4 IMPLANT
STENT CONTOUR 6FRX24X.038 (STENTS) ×4 IMPLANT
TUBING CONNECTING 10 (TUBING) ×4 IMPLANT

## 2013-03-01 NOTE — H&P (Signed)
History of Present Illness [name] was seen in the ER this morning for severe left sided abdominal pain associated with nausea. She was seen by her PCP 3 days ago for mild to moderate abdominal pain. She was prescribed antibiotics for presumed diverticulitis which she has a history of. The pain did not go away. She went to the ER this morning. She was given analgesics, made pain free and discharged home on oral analgesics. The pain recurred this afternoon and is now severe. She has a past history of kidney stone. She does not have any voiding symptoms. CT scan showed a 7 mm left proximal ureteral calculus with moderate hydronephrosis. The stone has a Hounsfield unit of 470.   Past Medical History Problems  1. History of hyperlipidemia (V12.29) 2. History of hypertension (V12.59)  Surgical History Problems  1. History of Back Surgery 2. History of Foot Surgery 3. History of Hysterectomy 4. History of Shoulder Surgery 5. History of Tonsillectomy  Current Meds 1. Aleve CAPS;  Therapy: (Recorded:29Jan2015) to Recorded 2. Atenolol 50 MG Oral Tablet;  Therapy: (Recorded:29Jan2015) to Recorded 3. Calcium/Vitamin D/Minerals TABS;  Therapy: (Recorded:29Jan2015) to Recorded 4. Fish Oil CAPS;  Therapy: (Recorded:29Jan2015) to Recorded 5. Flonase SUSP (Fluticasone Propionate);  Therapy: (Recorded:29Jan2015) to Recorded 6. Lisinopril-Hydrochlorothiazide 20-12.5 MG Oral Tablet;  Therapy: (Recorded:29Jan2015) to Recorded 7. Multi-Vitamin TABS;  Therapy: (Recorded:29Jan2015) to Recorded 8. Simvastatin 40 MG Oral Tablet;  Therapy: (Recorded:29Jan2015) to Recorded  Allergies Medication  1. No Known Drug Allergies Non-Medication  2. Shellfish  Family History Problems  1. Family history of glaucoma (V19.11) : Father 2. Family history of hypercholesterolemia (V18.19) : Father 3. Family history of hypertension (V17.49) : Mother  Social History Problems    Denied: History of Alcohol  use   Caffeine use (V49.89)   Father deceased   [address]991   Married   Mother deceased   7189   Never a smoker (V49.89)   Number of children   3 sons   Retired  Review of Systems Genitourinary, constitutional, skin, eye, otolaryngeal, hematologic/lymphatic, cardiovascular, pulmonary, endocrine, musculoskeletal, gastrointestinal, neurological and psychiatric system(s) were reviewed and pertinent findings if present are noted.  Genitourinary: urinary frequency, urinary urgency, nocturia, incontinence and hematuria.  Gastrointestinal: nausea and vomiting.  Constitutional: night sweats and feeling tired (fatigue).  Cardiovascular: leg swelling.  Musculoskeletal: back pain.  Neurological: headache.    Vitals Vital Signs [Data Includes: Last 1 Day]  Recorded: 29Jan2015 02:26PM  Height: 5 ft 2 in Weight: 212 lb  BMI Calculated: 38.78 BSA Calculated: 1.96 Blood Pressure: 127 / 83 Heart Rate: 64 Respiration: 18  Physical Exam Constitutional: Well nourished and well developed . No acute distress.  ENT:. The ears and nose are normal in appearance.  Neck: The appearance of the neck is normal and no neck mass is present.  Pulmonary: No respiratory distress and normal respiratory rhythm and effort.  Cardiovascular: Heart rate and rhythm are normal . No peripheral edema.  Abdomen: The abdomen is soft and nontender. No masses are palpated. No CVA tenderness. No hernias are palpable. No hepatosplenomegaly noted.  Lymphatics: The femoral and inguinal nodes are not enlarged or tender.  Skin: Normal skin turgor, no visible rash and no visible skin lesions.  Neuro/Psych:. Mood and affect are appropriate.    Results/Data Urine [Data Includes: Last 1 Day]   29Jan2015  COLOR YELLOW   APPEARANCE CLEAR   SPECIFIC GRAVITY 1.020   pH 5.5   GLUCOSE NEG mg/dL  BILIRUBIN NEG   KETONE NEG  mg/dL  BLOOD TRACE   PROTEIN NEG mg/dL  UROBILINOGEN 0.2 mg/dL  NITRITE NEG   LEUKOCYTE ESTERASE NEG    SQUAMOUS EPITHELIAL/HPF RARE   WBC 0-2 WBC/hpf  RBC 3-6 RBC/hpf  BACTERIA NONE SEEN   CRYSTALS NONE SEEN   CASTS NONE SEEN    Assessment Assessed  1. Calculus of ureter (592.1) 2. Hydronephrosis, left (591)  Plan Calculus of ureter  1. Administered: Ketorolac Tromethamine 60 MG/2ML Injection Solution Health Maintenance  2. UA With REFLEX; [Do Not Release]; Status:Resulted - Requires Verification;   Done:  29Jan2015 02:07PM   I went over the treatment options with the patient and her husband: ESL versus stent placement, ureteroscopy with holmium laser. I told them that ESL is the least invasive of those procedures. The risks of ESL were discussed: hemorrhage, renal or peri renal hematoma, injury to adjacent organs, steinstrasse, inability to fragment the stone. They understand and wish to proceed. The ESL truck is here today. Will schedule the procedure for today.  Patient took Advil for pain this morning and had a injection of Toradol in the office. We can not do ESL today. I will put a stent today and reschedule ESL.1

## 2013-03-01 NOTE — Preoperative (Signed)
Beta Blockers   Reason not to administer Beta Blockers:Not Applicable 

## 2013-03-01 NOTE — Transfer of Care (Signed)
Immediate Anesthesia Transfer of Care Note  Patient: Carol Gamble  Procedure(s) Performed: Procedure(s): CYSTOSCOPY/RETROGRADE/URETEROSCOPY/STONE EXTRACTION WITH BASKET/INSERTION LEFT DOUBLE  J STENT (Left) HOLMIUM LASER APPLICATION (Left)  Patient Location: PACU  Anesthesia Type:General  Level of Consciousness: awake, alert  and oriented  Airway & Oxygen Therapy: Patient Spontanous Breathing and Patient connected to face mask oxygen  Post-op Assessment: Report given to PACU RN and Post -op Vital signs reviewed and stable  Post vital signs: Reviewed and stable  Complications: No apparent anesthesia complications

## 2013-03-01 NOTE — ED Provider Notes (Signed)
CSN: [id number]     Arrival date & time 03/01/13  0802 History   First MD Initiated Contact with Patient 03/01/13 [phone]     Chief Complaint  Patient presents with  . Abdominal Pain   (Consider location/radiation/quality/duration/timing/severity/associated sxs/prior Treatment) HPI Comments: Patient is a 73 year old female with history of kidney stones and diverticulitis. She started with left lower quadrant pain 4 days ago. She was seen by her doctor and prescribed antibiotics for suspected diverticulitis. She presents here complaining that she is not improving in her pain is worsened. She denies fevers or chills. She denies vomiting or diarrhea but does report some nausea.  Patient is a 73 y.o. female presenting with abdominal pain. The history is provided by the patient.  Abdominal Pain Pain location:  LLQ Pain quality: cramping   Pain radiates to:  Does not radiate Pain severity:  Moderate Onset quality:  Sudden Duration:  5 days Timing:  Intermittent Progression:  Worsening Chronicity:  New Relieved by:  Nothing Ineffective treatments:  None tried   Past Medical History  Diagnosis Date  . Hypertension     states she always has palpitations   . Shortness of breath   . Arthritis     HNP- low back   . Complication of anesthesia     pt. reports that when she wakes up she had a hard time catching her breath & the PACU team told her that her BP & pulse were not doing what it was suppose to do  . PONV (postoperative nausea and vomiting)    Past Surgical History  Procedure Laterality Date  . Rotator cuff repair      both shoulders done, last one - 2010  . Foot tendon surgery  1985  . Abdominal hysterectomy      1993  . Finger mass excision  2008  . Back surgery      2000,01,10  . Tonsillectomy    . Inner ear surgery      x2   No family history on file. History  Substance Use Topics  . Smoking status: Never Smoker   . Smokeless tobacco: Not on file  . Alcohol Use: No    OB History   Grav Para Term Preterm Abortions TAB SAB Ect Mult Living                 Review of Systems  Gastrointestinal: Positive for abdominal pain.  All other systems reviewed and are negative.    Allergies  Shrimp  Home Medications   Current Outpatient Rx  Name  Route  Sig  Dispense  Refill  . atenolol (TENORMIN) 50 MG tablet   Oral   Take 50 mg by mouth daily with breakfast.         . CALCIUM-VITAMIN D PO   Oral   Take 1 tablet by mouth 2 (two) times daily.         . ciprofloxacin (CIPRO) 500 MG tablet   Oral   Take 500 mg by mouth 2 (two) times daily.         . fluticasone (FLONASE) 50 MCG/ACT nasal spray   Each Nare   Place 2 sprays into both nostrils daily as needed for allergies or rhinitis.          Marland Kitchen HYDROcodone-acetaminophen (NORCO/VICODIN) 5-325 MG per tablet   Oral   Take 1 tablet by mouth every 4 (four) hours as needed for moderate pain.         . hydroxypropyl  methylcellulose (ISOPTO TEARS) 2.5 % ophthalmic solution   Both Eyes   Place 2 drops into both eyes 3 (three) times daily as needed for dry eyes.          Marland Kitchen lisinopril-hydrochlorothiazide (PRINZIDE,ZESTORETIC) 20-12.5 MG per tablet   Oral   Take 1 tablet by mouth daily with breakfast.         . Multiple Vitamin (MULTIVITAMIN WITH MINERALS) TABS   Oral   Take 1 tablet by mouth daily.         . naproxen sodium (ANAPROX) 220 MG tablet   Oral   Take 440 mg by mouth daily as needed (back pain).         . Omega-3 Fatty Acids (FISH OIL) 1200 MG CPDR   Oral   Take 1 capsule by mouth 2 (two) times daily.         . simvastatin (ZOCOR) 40 MG tablet   Oral   Take 40 mg by mouth at bedtime.          BP 115/57  Pulse 57  Temp(Src) 97.4 F (36.3 C) (Oral)  Resp 20  Ht 5\' 2"  (1.575 m)  Wt 212 lb (96.163 kg)  BMI 38.77 kg/m2  SpO2 94% Physical Exam  Nursing note and vitals reviewed. Constitutional: She is oriented to person, place, and time. She appears  well-developed and well-nourished. No distress.  HENT:  Head: Normocephalic and atraumatic.  Neck: Normal range of motion. Neck supple.  Cardiovascular: Normal rate and regular rhythm.  Exam reveals no gallop and no friction rub.   No murmur heard. Pulmonary/Chest: Effort normal and breath sounds normal. No respiratory distress. She has no wheezes.  Abdominal: Soft. Bowel sounds are normal. She exhibits no distension and no mass. There is tenderness.  There is tenderness to palpation in the left lower quadrant with no rebound and no guarding.  Musculoskeletal: Normal range of motion.  Neurological: She is alert and oriented to person, place, and time.  Skin: Skin is warm and dry. She is not diaphoretic.    ED Course  Procedures (including critical care time) Labs Review Labs Reviewed  COMPREHENSIVE METABOLIC PANEL - Abnormal; Notable for the following:    Potassium 3.6 (*)    Glucose, Bld 126 (*)    Creatinine, Ser 1.32 (*)    Albumin 3.4 (*)    GFR calc non Af Amer 40 (*)    GFR calc Af Amer 46 (*)    All other components within normal limits  CBC WITH DIFFERENTIAL - Abnormal; Notable for the following:    WBC 12.1 (*)    Neutrophils Relative % 80 (*)    Neutro Abs 9.7 (*)    Lymphocytes Relative 11 (*)    All other components within normal limits  URINALYSIS, ROUTINE W REFLEX MICROSCOPIC - Abnormal; Notable for the following:    Color, Urine AMBER (*)    APPearance CLOUDY (*)    Specific Gravity, Urine 1.036 (*)    Hgb urine dipstick LARGE (*)    Bilirubin Urine MODERATE (*)    Ketones, ur 15 (*)    Protein, ur 30 (*)    Nitrite POSITIVE (*)    Leukocytes, UA SMALL (*)    All other components within normal limits  URINE MICROSCOPIC-ADD ON - Abnormal; Notable for the following:    Crystals CA OXALATE CRYSTALS (*)    All other components within normal limits  LIPASE, BLOOD   Imaging Review Ct Abdomen Pelvis Wo Contrast  03/01/2013   CLINICAL DATA:  Kidney stones.   EXAM: CT ABDOMEN AND PELVIS WITHOUT CONTRAST  TECHNIQUE: Multidetector CT imaging of the abdomen and pelvis was performed following the standard protocol without intravenous contrast.  COMPARISON:  CT 12/14/2010.  FINDINGS: 8 mm simple cyst right lobe liver. Liver otherwise normal. Spleen normal. Pancreas normal. Gallstones. No biliary distention.  Adrenals normal. Right kidney and renal collecting system normal. 7 mm stone proximal left ureter with prominent left hydronephrosis and perirenal stranding. Bladder is nondistended. Phleboliths.  No adenopathy.  Aorta normal caliber.  Appendix normal. Sigmoid colonic diverticulosis. No bowel distention. No free air.  Mild cardiomegaly. Mild basilar atelectasis. Degenerative changes and postsurgical changes lumbar spine.  IMPRESSION: 7 mm stone proximal left ureter with prominent left hydronephrosis.   Electronically Signed   By: Maisie Fushomas  Register   On: 03/01/2013 09:18      MDM  No diagnosis found. Workup reveals a 7 mm proximal left ureteral stone with left hydronephrosis. She is feeling better with morphine. Her urinalysis reveals large hemoglobin and calcium oxylate crystals. She is nontoxic-appearing and I believe is stable for discharge with urology followup. She will be given pain medication and understands to return if her pain substantially worsens, she develops high fever or vomiting with an inability to keep her medications down. Her UA is nitrite positive but she is currently taking Cipro. I will advise her to continue this as well.    Geoffery Lyonsouglas Mattew Chriswell, MD 03/01/13 1026

## 2013-03-01 NOTE — Anesthesia Preprocedure Evaluation (Addendum)
Anesthesia Evaluation  Patient identified by MRN, date of birth, ID band Patient awake    Reviewed: Allergy & Precautions, H&P , NPO status , Patient's Chart, lab work & pertinent test results  History of Anesthesia Complications (+) PONV and history of anesthetic complications  Airway Mallampati: II TM Distance: >3 FB Neck ROM: Full    Dental no notable dental hx.    Pulmonary shortness of breath,  breath sounds clear to auscultation  Pulmonary exam normal       Cardiovascular Exercise Tolerance: Good hypertension, Pt. on medications and Pt. on home beta blockers Rhythm:Regular Rate:Normal     Neuro/Psych  Headaches, negative psych ROS   GI/Hepatic negative GI ROS, Neg liver ROS,   Endo/Other  Morbid obesity  Renal/GU negative Renal ROS  negative genitourinary   Musculoskeletal negative musculoskeletal ROS (+)   Abdominal   Peds negative pediatric ROS (+)  Hematology negative hematology ROS (+)   Anesthesia Other Findings   Reproductive/Obstetrics negative OB ROS                          Anesthesia Physical Anesthesia Plan  ASA: III and emergent  Anesthesia Plan: General   Post-op Pain Management:    Induction: Intravenous  Airway Management Planned: Oral ETT  Additional Equipment:   Intra-op Plan:   Post-operative Plan: Extubation in OR  Informed Consent: I have reviewed the patients History and Physical, chart, labs and discussed the procedure including the risks, benefits and alternatives for the proposed anesthesia with the patient or authorized representative who has indicated his/her understanding and acceptance.   Dental advisory given  Plan Discussed with: CRNA  Anesthesia Plan Comments:         Anesthesia Quick Evaluation

## 2013-03-01 NOTE — Anesthesia Postprocedure Evaluation (Signed)
  Anesthesia Post-op Note  Patient: Carol Gamble  Procedure(s) Performed: Procedure(s) (LRB): CYSTOSCOPY/RETROGRADE/URETEROSCOPY/STONE EXTRACTION WITH BASKET/INSERTION LEFT DOUBLE  J STENT (Left) HOLMIUM LASER APPLICATION (Left)  Patient Location: PACU  Anesthesia Type: General  Level of Consciousness: awake and alert   Airway and Oxygen Therapy: Patient Spontanous Breathing  Post-op Pain: mild  Post-op Assessment: Post-op Vital signs reviewed, Patient's Cardiovascular Status Stable, Respiratory Function Stable, Patent Airway and No signs of Nausea or vomiting  Last Vitals:  Filed Vitals:   03/01/13 1930  BP: 127/61  Pulse: 78  Temp:   Resp:     Post-op Vital Signs: stable   Complications: No apparent anesthesia complications

## 2013-03-01 NOTE — ED Notes (Signed)
Pt reports Sunday night she started hurting. Hx of diverticulitis and thought that is what it was. Went to PCP on Monday and they said it was diverticulitis. Was given antibiotics, pain and nausea meds. The pain meds have been working until early this morning. Pt c/o severe abd pain and nausea. Denies vomiting today. Denies diarrhea, sts she actually feels constipated. Pain to left lower quadrant. Nad, skin warm and dry, resp e/u.

## 2013-03-01 NOTE — Discharge Instructions (Signed)
Percocet as needed for pain.  Followup with urology. Call to arrange this appointment. The contact information for Dr. Brunilda PayorNesi from [facility] has been provided on this discharge summary.  Return to the ER if you develop significantly worsening pain, high fever, vomiting with an inability to keep your medications down, or other new or concerning symptoms.   Kidney Stones Kidney stones (urolithiasis) are deposits that form inside your kidneys. The intense pain is caused by the stone moving through the urinary tract. When the stone moves, the ureter goes into spasm around the stone. The stone is usually passed in the urine.  CAUSES   A disorder that makes certain neck glands produce too much parathyroid hormone (primary hyperparathyroidism).  A buildup of uric acid crystals, similar to gout in your joints.  Narrowing (stricture) of the ureter.  A kidney obstruction present at birth (congenital obstruction).  Previous surgery on the kidney or ureters.  Numerous kidney infections. SYMPTOMS   Feeling sick to your stomach (nauseous).  Throwing up (vomiting).  Blood in the urine (hematuria).  Pain that usually spreads (radiates) to the groin.  Frequency or urgency of urination. DIAGNOSIS   Taking a history and physical exam.  Blood or urine tests.  CT scan.  Occasionally, an examination of the inside of the urinary bladder (cystoscopy) is performed. TREATMENT   Observation.  Increasing your fluid intake.  Extracorporeal shock wave lithotripsy This is a noninvasive procedure that uses shock waves to break up kidney stones.  Surgery may be needed if you have severe pain or persistent obstruction. There are various surgical procedures. Most of the procedures are performed with the use of small instruments. Only small incisions are needed to accommodate these instruments, so recovery time is minimized. The size, location, and chemical composition are all important variables  that will determine the proper choice of action for you. Talk to your health care provider to better understand your situation so that you will minimize the risk of injury to yourself and your kidney.  HOME CARE INSTRUCTIONS   Drink enough water and fluids to keep your urine clear or pale yellow. This will help you to pass the stone or stone fragments.  Strain all urine through the provided strainer. Keep all particulate matter and stones for your health care provider to see. The stone causing the pain may be as small as a grain of salt. It is very important to use the strainer each and every time you pass your urine. The collection of your stone will allow your health care provider to analyze it and verify that a stone has actually passed. The stone analysis will often identify what you can do to reduce the incidence of recurrences.  Only take over-the-counter or prescription medicines for pain, discomfort, or fever as directed by your health care provider.  Make a follow-up appointment with your health care provider as directed.  Get follow-up X-rays if required. The absence of pain does not always mean that the stone has passed. It may have only stopped moving. If the urine remains completely obstructed, it can cause loss of kidney function or even complete destruction of the kidney. It is your responsibility to make sure X-rays and follow-ups are completed. Ultrasounds of the kidney can show blockages and the status of the kidney. Ultrasounds are not associated with any radiation and can be performed easily in a matter of minutes. SEEK MEDICAL CARE IF:  You experience pain that is progressive and unresponsive to any pain medicine  you have been prescribed. SEEK IMMEDIATE MEDICAL CARE IF:   Pain cannot be controlled with the prescribed medicine.  You have a fever or shaking chills.  The severity or intensity of pain increases over 18 hours and is not relieved by pain medicine.  You develop a  new onset of abdominal pain.  You feel faint or pass out.  You are unable to urinate. MAKE SURE YOU:   Understand these instructions.  Will watch your condition.  Will get help right away if you are not doing well or get worse. Document Released: 01/18/2005 Document Revised: 09/20/2012 Document Reviewed: 06/21/2012 [facility] Patient Information 2014 [place].

## 2013-03-01 NOTE — Anesthesia Procedure Notes (Signed)
Procedure Name: Intubation Date/Time: 03/01/2013 6:00 PM Performed by: Leroy LibmanEARDON, Shelva Hetzer L Patient Re-evaluated:Patient Re-evaluated prior to inductionOxygen Delivery Method: Circle system utilized Preoxygenation: Pre-oxygenation with 100% oxygen Intubation Type: IV induction, Cricoid Pressure applied and Rapid sequence Laryngoscope Size: Miller and 2 Grade View: Grade I Tube type: Oral Tube size: 7.5 mm Number of attempts: 1 Airway Equipment and Method: Stylet Placement Confirmation: ETT inserted through vocal cords under direct vision,  breath sounds checked- equal and bilateral and positive ETCO2 Secured at: 20 cm Tube secured with: Tape Dental Injury: Teeth and Oropharynx as per pre-operative assessment

## 2013-03-01 NOTE — Op Note (Addendum)
Carol Gamble is a 73 y.o.   03/01/2013  General  Preop diagnosis: Left proximal ureteral calculus, left hydronephrosis  Postop diagnosis: Same  Procedure done: Cystoscopy, left retrograde pyelogram, ureteroscopy, holmium laser of ureteral calculus, stone extraction, insertion of double-J stent  Surgeon: Wendie SimmerMarc H. Keymani Glynn  Anesthesia: General  Indication: Patient is a 73 years old female who was seen in the emergency room early this morning with severe left-sided abdominal pain associated with nausea. She has been having pain since Monday. She saw her primary care physician who put her on antibiotics for presumed diverticulitis. She has a history of diverticulitis. However that did not relieve the pain. Workup in the emergency room included a CT scan that showed a 7 mm stone in the proximal left ureter with moderate hydronephrosis. The pain subsided with IV analgesics and she was discharged home. However the pain recurred this afternoon and was severe. She had Advil this morning and could not have ESL. She is now scheduled for cystoscopy, ureteroscopy and possible stone manipulation  Procedure: The patient was identified by her wrist band and proper timeout was taken.  Under general anesthesia she was prepped and draped and placed in the dorsolithotomy position. A panendoscope was inserted in the bladder. The bladder mucosa is normal. There is no stone or tumor in the bladder. The ureteral orifices are in normal position and shape.  Left retrograde pyelogram:  A cone-tip catheter was passed through the cystoscope and the left ureteral orifice. Contrast was injected through the cone-tip catheter. The distal and mid ureter appear normal. There is a filling defect in the proximal ureter at about L3. The ureter proximal to the stone is moderately dilated. The cone-tip catheter was removed.  A sensor wire was passed through the cystoscope and the left ureter. A ureteroscope access sheath was passed  over the sensor wire to dilate the intramural ureter but I could not pass the access sheath through the intramural ureter. A semirigid ureteroscope was then passed in the bladder and through the left ureteral orifice and advanced without difficulty up to the proximal ureter where the stone was identified. With a 365 microfiber holmium laser at a setting of 0.5 and 20 shocks the stone was fragmented in smaller fragments. The fragments migrated up in the kidney. I removed the ureteroscope. Then a digital ureteroscope access sheath was passed over the sensor wire and advanced up to the proximal ureter and the sensor wire was removed. The stone fragments were visualized in a midpole calyx. I attempted to remove the larger fragment but it could not pass through the ureteroscope access sheath. With the holmium laser the stone was broken up in smaller fragments. A Nitinol basket was then passed through the ureteroscope and all visible stone fragments were removed. There was no evidence of remaining stone fragment in the kidney. The sensor wire was then passed through the ureteroscope access sheath and the ureteroscope access sheath was removed. The sensor wire was then backloaded into the cystoscope and a #[id number] double-J stent was passed over the sensor wire. The proximal curl of the double-J stent is in the renal pelvis; the distal curl is in the bladder. The bladder was then emptied and the cystoscope and sensor wire were removed.  The patient tolerated the procedure well left the OR satisfactory condition to postanesthesia care unit  CC: Dr. Blair Heysobert Ehinger

## 2013-03-02 ENCOUNTER — Encounter (HOSPITAL_COMMUNITY): Payer: Self-pay | Admitting: Urology

## 2013-04-11 ENCOUNTER — Other Ambulatory Visit (HOSPITAL_COMMUNITY): Payer: Self-pay | Admitting: Family Medicine

## 2013-04-11 DIAGNOSIS — Z1231 Encounter for screening mammogram for malignant neoplasm of breast: Secondary | ICD-10-CM

## 2013-05-18 ENCOUNTER — Ambulatory Visit (HOSPITAL_COMMUNITY)
Admission: RE | Admit: 2013-05-18 | Discharge: 2013-05-18 | Disposition: A | Discharge status: Home / Self Care | Payer: Medicare Other | Source: Ambulatory Visit | Attending: Family Medicine | Admitting: Family Medicine

## 2013-05-18 DIAGNOSIS — Z1231 Encounter for screening mammogram for malignant neoplasm of breast: Secondary | ICD-10-CM

## 2014-04-09 ENCOUNTER — Other Ambulatory Visit (HOSPITAL_COMMUNITY): Payer: Self-pay | Admitting: Family Medicine

## 2014-04-09 DIAGNOSIS — Z1231 Encounter for screening mammogram for malignant neoplasm of breast: Secondary | ICD-10-CM

## 2014-05-20 ENCOUNTER — Ambulatory Visit (HOSPITAL_COMMUNITY)
Admission: RE | Admit: 2014-05-20 | Discharge: 2014-05-20 | Disposition: A | Discharge status: Home / Self Care | Payer: Medicare Other | Source: Ambulatory Visit | Attending: Family Medicine | Admitting: Family Medicine

## 2014-05-20 DIAGNOSIS — Z1231 Encounter for screening mammogram for malignant neoplasm of breast: Secondary | ICD-10-CM | POA: Insufficient documentation

## 2015-04-21 ENCOUNTER — Other Ambulatory Visit: Payer: Self-pay

## 2015-04-21 DIAGNOSIS — Z1231 Encounter for screening mammogram for malignant neoplasm of breast: Secondary | ICD-10-CM

## 2015-05-28 ENCOUNTER — Ambulatory Visit
Admission: RE | Admit: 2015-05-28 | Discharge: 2015-05-28 | Disposition: A | Discharge status: Home / Self Care | Payer: Medicare Other | Source: Ambulatory Visit

## 2015-05-28 DIAGNOSIS — Z1231 Encounter for screening mammogram for malignant neoplasm of breast: Secondary | ICD-10-CM

## 2015-08-15 ENCOUNTER — Other Ambulatory Visit: Payer: Self-pay | Admitting: Gastroenterology

## 2016-02-02 DIAGNOSIS — R569 Unspecified convulsions: Secondary | ICD-10-CM

## 2016-02-02 HISTORY — DX: Unspecified convulsions: R56.9

## 2016-02-27 ENCOUNTER — Emergency Department (HOSPITAL_COMMUNITY): Payer: Medicare Other

## 2016-02-27 ENCOUNTER — Encounter (HOSPITAL_COMMUNITY): Payer: Self-pay

## 2016-02-27 ENCOUNTER — Observation Stay (HOSPITAL_COMMUNITY)
Admission: EM | Admit: 2016-02-27 | Discharge: 2016-02-29 | Disposition: A | Discharge status: Home / Self Care | Payer: Medicare Other | Attending: Internal Medicine | Admitting: Internal Medicine

## 2016-02-27 DIAGNOSIS — I081 Rheumatic disorders of both mitral and tricuspid valves: Secondary | ICD-10-CM | POA: Diagnosis not present

## 2016-02-27 DIAGNOSIS — I739 Peripheral vascular disease, unspecified: Secondary | ICD-10-CM | POA: Diagnosis not present

## 2016-02-27 DIAGNOSIS — I10 Essential (primary) hypertension: Secondary | ICD-10-CM | POA: Diagnosis not present

## 2016-02-27 DIAGNOSIS — R569 Unspecified convulsions: Secondary | ICD-10-CM | POA: Insufficient documentation

## 2016-02-27 DIAGNOSIS — Z79899 Other long term (current) drug therapy: Secondary | ICD-10-CM | POA: Diagnosis not present

## 2016-02-27 DIAGNOSIS — Z91013 Allergy to seafood: Secondary | ICD-10-CM | POA: Diagnosis not present

## 2016-02-27 DIAGNOSIS — F05 Delirium due to known physiological condition: Secondary | ICD-10-CM | POA: Insufficient documentation

## 2016-02-27 DIAGNOSIS — K219 Gastro-esophageal reflux disease without esophagitis: Secondary | ICD-10-CM | POA: Insufficient documentation

## 2016-02-27 DIAGNOSIS — R0602 Shortness of breath: Secondary | ICD-10-CM | POA: Diagnosis not present

## 2016-02-27 DIAGNOSIS — E785 Hyperlipidemia, unspecified: Secondary | ICD-10-CM

## 2016-02-27 DIAGNOSIS — Z90710 Acquired absence of both cervix and uterus: Secondary | ICD-10-CM | POA: Insufficient documentation

## 2016-02-27 DIAGNOSIS — Z7982 Long term (current) use of aspirin: Secondary | ICD-10-CM | POA: Insufficient documentation

## 2016-02-27 DIAGNOSIS — G459 Transient cerebral ischemic attack, unspecified: Secondary | ICD-10-CM | POA: Diagnosis not present

## 2016-02-27 DIAGNOSIS — R4701 Aphasia: Secondary | ICD-10-CM | POA: Insufficient documentation

## 2016-02-27 DIAGNOSIS — Z7722 Contact with and (suspected) exposure to environmental tobacco smoke (acute) (chronic): Secondary | ICD-10-CM | POA: Diagnosis not present

## 2016-02-27 DIAGNOSIS — E7800 Pure hypercholesterolemia, unspecified: Secondary | ICD-10-CM | POA: Diagnosis not present

## 2016-02-27 DIAGNOSIS — M5126 Other intervertebral disc displacement, lumbar region: Secondary | ICD-10-CM | POA: Diagnosis not present

## 2016-02-27 DIAGNOSIS — R51 Headache: Secondary | ICD-10-CM | POA: Diagnosis not present

## 2016-02-27 DIAGNOSIS — R2990 Unspecified symptoms and signs involving the nervous system: Secondary | ICD-10-CM

## 2016-02-27 DIAGNOSIS — M1990 Unspecified osteoarthritis, unspecified site: Secondary | ICD-10-CM | POA: Diagnosis not present

## 2016-02-27 DIAGNOSIS — Z87442 Personal history of urinary calculi: Secondary | ICD-10-CM | POA: Diagnosis not present

## 2016-02-27 HISTORY — DX: Hyperlipidemia, unspecified: E78.5

## 2016-02-27 HISTORY — DX: Unspecified symptoms and signs involving the nervous system: R29.90

## 2016-02-27 LAB — COMPREHENSIVE METABOLIC PANEL
ALBUMIN: 4.1 g/dL (ref 3.5–5.0)
ALT: 18 U/L (ref 14–54)
AST: 18 U/L (ref 15–41)
Alkaline Phosphatase: 56 U/L (ref 38–126)
Anion gap: 9 (ref 5–15)
BUN: 18 mg/dL (ref 6–20)
CHLORIDE: 105 mmol/L (ref 101–111)
CO2: 26 mmol/L (ref 22–32)
Calcium: 9.7 mg/dL (ref 8.9–10.3)
Creatinine, Ser: 0.9 mg/dL (ref 0.44–1.00)
GFR calc Af Amer: >60 mL/min (ref >60)
GFR calc non Af Amer: >60 mL/min (ref >60)
GLUCOSE: 92 mg/dL (ref 65–99)
Potassium: 4.3 mmol/L (ref 3.5–5.1)
Sodium: 140 mmol/L (ref 135–145)
Total Bilirubin: 0.6 mg/dL (ref 0.3–1.2)
Total Protein: 6.9 g/dL (ref 6.5–8.1)

## 2016-02-27 LAB — CBC WITH DIFFERENTIAL/PLATELET
Basophils Absolute: 0 10*3/uL (ref 0.0–0.1)
Basophils Relative: 0 %
EOS PCT: 2 %
Eosinophils Absolute: 0.1 10*3/uL (ref 0.0–0.7)
HCT: 43.1 % (ref 36.0–46.0)
Hemoglobin: 14.3 g/dL (ref 12.0–15.0)
Lymphocytes Relative: 21 %
Lymphs Abs: 1.7 10*3/uL (ref 0.7–4.0)
MCH: 29.9 pg (ref 26.0–34.0)
MCHC: 33.2 g/dL (ref 30.0–36.0)
MCV: 90 fL (ref 78.0–100.0)
MONO ABS: 0.5 10*3/uL (ref 0.1–1.0)
Monocytes Relative: 7 %
NEUTROS ABS: 5.5 10*3/uL (ref 1.7–7.7)
Neutrophils Relative %: 70 %
PLATELETS: 239 10*3/uL (ref 150–400)
RBC: 4.79 MIL/uL (ref 3.87–5.11)
RDW: 13.7 % (ref 11.5–15.5)
WBC: 7.8 10*3/uL (ref 4.0–10.5)

## 2016-02-27 LAB — URINALYSIS, ROUTINE W REFLEX MICROSCOPIC
BILIRUBIN URINE: NEGATIVE
GLUCOSE, UA: NEGATIVE mg/dL
HGB URINE DIPSTICK: NEGATIVE
Ketones, ur: 5 mg/dL — AB
NITRITE: NEGATIVE
PROTEIN: NEGATIVE mg/dL
Specific Gravity, Urine: 1.015 (ref 1.005–1.030)
pH: 5 (ref 5.0–8.0)

## 2016-02-27 LAB — I-STAT TROPONIN, ED: Troponin i, poc: 0 ng/mL (ref 0.00–0.08)

## 2016-02-27 MED ORDER — LISINOPRIL 20 MG PO TABS
20.0000 mg | ORAL_TABLET | Freq: Every day | ORAL | Status: DC
  Administered 2016-02-29: 20 mg via ORAL
  Filled 2016-02-27 (×2): qty 1

## 2016-02-27 MED ORDER — ACETAMINOPHEN 650 MG RE SUPP
650.0000 mg | RECTAL | Status: DC | PRN
Start: 2016-02-27 — End: 2016-02-29

## 2016-02-27 MED ORDER — ACETAMINOPHEN 325 MG PO TABS: 650.0000 mg | ORAL_TABLET | ORAL | Status: DC | PRN

## 2016-02-27 MED ORDER — PANTOPRAZOLE SODIUM 40 MG PO TBEC
40.0000 mg | DELAYED_RELEASE_TABLET | Freq: Every day | ORAL | Status: DC
  Administered 2016-02-28 – 2016-02-29 (×2): 40 mg via ORAL
  Filled 2016-02-27 (×2): qty 1

## 2016-02-27 MED ORDER — ACETAMINOPHEN 325 MG PO TABS
650.0000 mg | ORAL_TABLET | Freq: Once | ORAL | Status: AC
  Administered 2016-02-27: 650 mg via ORAL
  Filled 2016-02-27: qty 2

## 2016-02-27 MED ORDER — ACETAMINOPHEN 160 MG/5ML PO SOLN: 650.0000 mg | ORAL | Status: DC | PRN

## 2016-02-27 MED ORDER — ASPIRIN 300 MG RE SUPP: 300.0000 mg | Freq: Every day | RECTAL | Status: DC

## 2016-02-27 MED ORDER — HYDROCHLOROTHIAZIDE 12.5 MG PO CAPS
12.5000 mg | ORAL_CAPSULE | Freq: Every day | ORAL | Status: DC
  Filled 2016-02-27 (×2): qty 1

## 2016-02-27 MED ORDER — ASPIRIN 325 MG PO TABS
325.0000 mg | ORAL_TABLET | Freq: Every day | ORAL | Status: DC
  Administered 2016-02-28 – 2016-02-29 (×2): 325 mg via ORAL
  Filled 2016-02-27 (×2): qty 1

## 2016-02-27 MED ORDER — LISINOPRIL-HYDROCHLOROTHIAZIDE 20-12.5 MG PO TABS: 1.0000 | ORAL_TABLET | Freq: Every day | ORAL | Status: DC

## 2016-02-27 MED ORDER — SIMVASTATIN 40 MG PO TABS
40.0000 mg | ORAL_TABLET | Freq: Every day | ORAL | Status: DC
  Administered 2016-02-28 (×2): 40 mg via ORAL
  Filled 2016-02-27 (×2): qty 1

## 2016-02-27 NOTE — ED Provider Notes (Signed)
[facility]-EMERGENCY DEPT Provider Note   CSN: [id number] Arrival date & time: 02/27/16  1818     History   Chief Complaint Chief Complaint  Patient presents with  . Aphasia    HPI Carol Gamble is a 76 y.o. female.  HPI Carol Gamble is a 76 y.o. female with hx of HTN, presents to ED with complaint of altered mental status now resolved. Pt was sitting at home when suddenly she had difficulty with her speech, deviated gaze onset at 1705 this evening witnessed by her husband and son. Pt did have short period of time when she would not respond however was still awake and able to ambulate with assist. When pt became responsive again, she seemed to be somewhat confused and when EMS arrived would not follow directions initially. En route to the hospital symptoms resolved. Patient states she is able to remember some of her symptoms, but not all. She states that she does remember saying only right side of pupils faces during her episode. Denies hx of the same. Reports headache, denies any other complaints at this time.    Past Medical History:  Diagnosis Date  . Arthritis    HNP- low back   . Complication of anesthesia    pt. reports that when she wakes up she had a hard time catching her breath & the PACU team told her that her BP & pulse were not doing what it was suppose to do  . Headache(784.0)   . History of asthma    childhood, age 89  . History of kidney stones about 2008ish  . Hypertension    states she always has palpitations   . PONV (postoperative nausea and vomiting)   . Shortness of breath    with exertion    Patient Active Problem List   Diagnosis Date Noted  . HNP (herniated nucleus pulposus), lumbar 06/30/2012    Past Surgical History:  Procedure Laterality Date  . ABDOMINAL HYSTERECTOMY     1993  . BACK SURGERY     2000,01,10  . CYSTOSCOPY/RETROGRADE/URETEROSCOPY/STONE EXTRACTION WITH BASKET Left 03/01/2013   Procedure:  CYSTOSCOPY/RETROGRADE/URETEROSCOPY/STONE EXTRACTION WITH BASKET/INSERTION LEFT DOUBLE  J STENT;  Surgeon: Su GrandMarc Nesi, MD;  Location: [facility];  Service: Urology;  Laterality: Left;  . FINGER MASS EXCISION  2008  . FOOT TENDON SURGERY  1985  . HOLMIUM LASER APPLICATION Left 03/01/2013   Procedure: HOLMIUM LASER APPLICATION;  Surgeon: Su GrandMarc Nesi, MD;  Location: [facility];  Service: Urology;  Laterality: Left;  . INNER EAR SURGERY     x2  . ROTATOR CUFF REPAIR     both shoulders done, last one - 2010  . TONSILLECTOMY      OB History    No data available       Home Medications    Prior to Admission medications   Medication Sig Start Date End Date Taking? Authorizing Provider  atenolol (TENORMIN) 50 MG tablet Take 50 mg by mouth daily with breakfast.    Historical Provider, MD  CALCIUM-VITAMIN D PO Take 1 tablet by mouth 2 (two) times daily.    Historical Provider, MD  ciprofloxacin (CIPRO) 500 MG tablet Take 500 mg by mouth 2 (two) times daily.    Historical Provider, MD  fluticasone (FLONASE) 50 MCG/ACT nasal spray Place 2 sprays into both nostrils daily as needed for allergies or rhinitis.     Historical Provider, MD  HYDROcodone-acetaminophen (NORCO) 5-325 MG per tablet Take 1 tablet by mouth every 6 (six)  hours as needed for moderate pain. 03/01/13   Su Grand, MD  hydroxypropyl methylcellulose (ISOPTO TEARS) 2.5 % ophthalmic solution Place 2 drops into both eyes 3 (three) times daily as needed for dry eyes.     Historical Provider, MD  lisinopril-hydrochlorothiazide (PRINZIDE,ZESTORETIC) 20-12.5 MG per tablet Take 1 tablet by mouth daily with breakfast.    Historical Provider, MD  Multiple Vitamin (MULTIVITAMIN WITH MINERALS) TABS Take 1 tablet by mouth daily.    Historical Provider, MD  naproxen sodium (ANAPROX) 220 MG tablet Take 440 mg by mouth daily as needed (back pain).    Historical Provider, MD  Omega-3 Fatty Acids (FISH OIL) 1200 MG CPDR Take 1 capsule by mouth 2 (two) times daily.     Historical Provider, MD  oxyCODONE-acetaminophen (PERCOCET) 5-325 MG per tablet Take 2 tablets by mouth every 4 (four) hours as needed. 03/01/13   Geoffery Lyons, MD  simvastatin (ZOCOR) 40 MG tablet Take 40 mg by mouth at bedtime.    Historical Provider, MD    Family History History reviewed. No pertinent family history.  Social History Social History  Substance Use Topics  . Smoking status: Passive Smoke Exposure - Never Smoker    Years: 30.00    Types: Cigarettes  . Smokeless tobacco: Never Used  . Alcohol use No     Allergies   Shrimp [shellfish allergy]   Review of Systems Review of Systems  Constitutional: Negative for chills and fever.  Eyes: Positive for visual disturbance.  Respiratory: Negative for cough, chest tightness and shortness of breath.   Cardiovascular: Negative for chest pain, palpitations and leg swelling.  Gastrointestinal: Negative for abdominal pain, diarrhea, nausea and vomiting.  Genitourinary: Negative for dysuria, flank pain and pelvic pain.  Musculoskeletal: Negative for arthralgias, myalgias, neck pain and neck stiffness.  Skin: Negative for rash.  Neurological: Positive for headaches. Negative for dizziness and weakness.  Psychiatric/Behavioral: Positive for confusion.  All other systems reviewed and are negative.    Physical Exam Updated Vital Signs BP 173/90   Pulse 72   Temp 97.8 F (36.6 C) (Oral)   Resp 13   Ht 5\' 2"  (1.575 m)   Wt 93 kg   SpO2 98%   BMI 37.49 kg/m   Physical Exam  Constitutional: She appears well-developed and well-nourished. No distress.  HENT:  Head: Normocephalic.  Eyes: Conjunctivae are normal.  Neck: Neck supple.  Cardiovascular: Normal rate, regular rhythm and normal heart sounds.   Pulmonary/Chest: Effort normal and breath sounds normal. No respiratory distress. She has no wheezes. She has no rales.  Abdominal: Soft. Bowel sounds are normal. She exhibits no distension. There is no tenderness. There  is no rebound.  Musculoskeletal: She exhibits no edema.  Neurological: She is alert.  Skin: Skin is warm and dry.  Psychiatric: She has a normal mood and affect. Her behavior is normal.  Nursing note and vitals reviewed.    ED Treatments / Results  Labs (all labs ordered are listed, but only abnormal results are displayed) Labs Reviewed  URINALYSIS, ROUTINE W REFLEX MICROSCOPIC - Abnormal; Notable for the following:       Result Value   Ketones, ur 5 (*)    Leukocytes, UA SMALL (*)    Bacteria, UA RARE (*)    Squamous Epithelial / LPF 0-5 (*)    All other components within normal limits  CBC WITH DIFFERENTIAL/PLATELET  COMPREHENSIVE METABOLIC PANEL  I-STAT TROPOININ, ED    EKG  EKG Interpretation None  Radiology Dg Chest 2 View  Result Date: 02/27/2016 CLINICAL DATA:  Mental status changes. EXAM: CHEST  2 VIEW COMPARISON:  06/22/2012 FINDINGS: The lungs are clear wiithout focal pneumonia, edema, pneumothorax or pleural effusion. Chronic atelectasis or scarring left base. The cardiopericardial silhouette is within normal limits for size. The visualized bony structures of the thorax are intact. IMPRESSION: No acute findings. Electronically Signed   By: Kennith Center M.D.   On: 02/27/2016 20:39   Ct Head Wo Contrast  Result Date: 02/27/2016 CLINICAL DATA:  Acute onset of altered mental status. Initial encounter. EXAM: CT HEAD WITHOUT CONTRAST TECHNIQUE: Contiguous axial images were obtained from the base of the skull through the vertex without intravenous contrast. COMPARISON:  None. FINDINGS: Brain: No evidence of acute infarction, hemorrhage, hydrocephalus, extra-axial collection or mass lesion/mass effect. Mild periventricular white matter change likely reflects small vessel ischemic microangiopathy. The posterior fossa, including the cerebellum, brainstem and fourth ventricle, is within normal limits. The third and lateral ventricles, and basal ganglia are unremarkable in  appearance. The cerebral hemispheres are symmetric in appearance, with normal gray-white differentiation. No mass effect or midline shift is seen. Vascular: No hyperdense vessel or unexpected calcification. Skull: There is no evidence of fracture; visualized osseous structures are unremarkable in appearance. Sinuses/Orbits: The orbits are within normal limits. There is partial opacification of the right mastoid air cells. The paranasal sinuses and left mastoid air cells are well-aerated. Other: No significant soft tissue abnormalities are seen. IMPRESSION: 1. No acute intracranial pathology seen on CT. 2. Mild small vessel ischemic microangiopathy. 3. Partial opacification of the right mastoid air cells. Electronically Signed   By: Roanna Raider M.D.   On: 02/27/2016 20:21    Procedures Procedures (including critical care time)  Medications Ordered in ED Medications - No data to display   Initial Impression / Assessment and Plan / ED Course  I have reviewed the triage vital signs and the nursing notes.  Pertinent labs & imaging results that were available during my care of the patient were reviewed by me and considered in my medical decision making (see chart for details).    Patient in emergency department with episode of dysarthria, gaze deviation, symptoms around 5:05 PM, lasting approximately 30 minutes now resolved. She is currently back to baseline, alert and oriented, reports some symptoms of what sounds like homonomous hemianopsia. Had gaze deviation and dysarthria. Will get CT head, labs. Will monitor.   10:11 PM CT head negative. Labs normal. Patient continues to back at baseline. Question TIA vs CVA. I discussed this with Dr. Montez Morita with triad hospitalist who will admit patient for further workup. Ask for neurology consult.  10:14 PM Spoke with Dr. Roseanne Reno, will consult.      Final Clinical Impressions(s) / ED Diagnoses   Final diagnoses:  Transient cerebral ischemia,  unspecified type    New Prescriptions New Prescriptions   No medications on file     Jaynie Crumble, PA-C 02/29/16 [id number]    Courteney Lyn Mackuen, MD 03/01/16 [phone]

## 2016-02-27 NOTE — ED Notes (Signed)
Neuro at bedside.

## 2016-02-27 NOTE — Consult Note (Signed)
Admission H&P    Chief Complaint: Altered mental status with speech output difficulty.  HPI: Carol Gamble is an 76 y.o. female history of hypertension, asthma, and arthritis, brought to the ED following acute onset of difficulty with speech output, followed by staring and unresponsiveness. Patient recalls initial symptoms and new which he wanted to say but was unable to form the desired words. Shortly afterwards family noted that she developed a blank stare and did not respond to them at all. She exhibited behavior consistent with automatisms. When she started to recover she appeared to be somewhat confused. She recalls being able to see all the the left half of the EMT person's face. Symptoms resolved in route to the ED. CT scan of her head showed no acute intracranial abnormality. NIH stroke score at the time of this evaluation was 0.  LSN: 5:00 PM on 02/27/2016 tPA Given: No: Deficits rapidly resolved mRankin:  Past Medical History:  Diagnosis Date  . Arthritis    HNP- low back   . Complication of anesthesia    pt. reports that when she wakes up she had a hard time catching her breath & the PACU team told her that her BP & pulse were not doing what it was suppose to do  . Headache(784.0)   . History of asthma    childhood, age 68  . History of kidney stones about 2008ish  . Hypertension    states she always has palpitations   . PONV (postoperative nausea and vomiting)   . Shortness of breath    with exertion    Past Surgical History:  Procedure Laterality Date  . ABDOMINAL HYSTERECTOMY     1993  . BACK SURGERY     2000,01,10  . CYSTOSCOPY/RETROGRADE/URETEROSCOPY/STONE EXTRACTION WITH BASKET Left 03/01/2013   Procedure: CYSTOSCOPY/RETROGRADE/URETEROSCOPY/STONE EXTRACTION WITH BASKET/INSERTION LEFT DOUBLE  J STENT;  Surgeon: Lowella Bandy, MD;  Location: [facility];  Service: Urology;  Laterality: Left;  . FINGER MASS EXCISION  2008  . FOOT TENDON SURGERY  1985  . HOLMIUM LASER  APPLICATION Left 4/31/5400   Procedure: HOLMIUM LASER APPLICATION;  Surgeon: Lowella Bandy, MD;  Location: [facility];  Service: Urology;  Laterality: Left;  . INNER EAR SURGERY     x2  . ROTATOR CUFF REPAIR     both shoulders done, last one - 2010  . TONSILLECTOMY      History reviewed. No pertinent family history. Social History:  reports that she is a non-smoker but has been exposed to tobacco smoke. She has been exposed to tobacco smoke for the past 30.00 years. She has never used smokeless tobacco. She reports that she does not drink alcohol or use drugs.  Allergies:  Allergies  Allergen Reactions  . Shrimp [Shellfish Allergy] Hives    Medications: Preadmission medications were reviewed by me.  ROS: History obtained from the patient  General ROS: negative for - chills, fatigue, fever, night sweats, weight gain or weight loss Psychological ROS: negative for - behavioral disorder, hallucinations, memory difficulties, mood swings or suicidal ideation Ophthalmic ROS: negative for - blurry vision, double vision, eye pain or loss of vision ENT ROS: negative for - epistaxis, nasal discharge, oral lesions, sore throat, tinnitus or vertigo Allergy and Immunology ROS: negative for - hives or itchy/watery eyes Hematological and Lymphatic ROS: negative for - bleeding problems, bruising or swollen lymph nodes Endocrine ROS: negative for - galactorrhea, hair pattern changes, polydipsia/polyuria or temperature intolerance Respiratory ROS: negative for - cough, hemoptysis, shortness of  breath or wheezing Cardiovascular ROS: negative for - chest pain, dyspnea on exertion, edema or irregular heartbeat Gastrointestinal ROS: negative for - abdominal pain, diarrhea, hematemesis, nausea/vomiting or stool incontinence Genito-Urinary ROS: negative for - dysuria, hematuria, incontinence or urinary frequency/urgency Musculoskeletal ROS: negative for - joint swelling or muscular weakness Neurological ROS: as  noted in HPI Dermatological ROS: negative for rash and skin lesion changes  Physical Examination: Blood pressure 143/74, pulse 63, temperature 97.8 F (36.6 C), resp. rate 15, height 5' 2"  (1.575 m), weight 93 kg (205 lb), SpO2 100 %.  HEENT-  Normocephalic, no lesions, without obvious abnormality.  Normal external eye and conjunctiva.  Normal TM's bilaterally.  Normal auditory canals and external ears. Normal external nose, mucus membranes and septum.  Normal pharynx. Neck supple with no masses, nodes, nodules or enlargement. Cardiovascular - regular rate and rhythm, S1, S2 normal, no murmur, click, rub or gallop Lungs - chest clear, no wheezing, rales, normal symmetric air entry Abdomen - soft, non-tender; bowel sounds normal; no masses,  no organomegaly Extremities - no joint deformities, effusion, or inflammation  Neurologic Examination: Mental Status: Alert, oriented, thought content appropriate.  Speech fluent without evidence of aphasia. Able to follow commands without difficulty. Cranial Nerves: II-Visual fields were normal. III/IV/VI-Pupils were equal and reacted normally to light. Extraocular movements were full and conjugate.    V/VII-no facial numbness and no facial weakness. VIII-normal. X-normal speech. XI: trapezius strength/neck flexion strength normal bilaterally XII-midline tongue extension with normal strength. Motor: 5/5 bilaterally with normal tone and bulk Sensory: Normal throughout. Deep Tendon Reflexes: Trace to 1+ and symmetric in upper extremities and absent in lower extremities. Plantars: Mute bilaterally Cerebellar: Normal finger-to-nose testing. Carotid auscultation: Normal  Results for orders placed or performed during the hospital encounter of 02/27/16 (from the past 48 hour(s))  CBC with Differential     Status: None   Collection Time: 02/27/16  7:29 PM  Result Value Ref Range   WBC 7.8 4.0 - 10.5 K/uL   RBC 4.79 3.87 - 5.11 MIL/uL   Hemoglobin  14.3 12.0 - 15.0 g/dL   HCT 43.1 36.0 - 46.0 %   MCV 90.0 78.0 - 100.0 fL   MCH 29.9 26.0 - 34.0 pg   MCHC 33.2 30.0 - 36.0 g/dL   RDW 13.7 11.5 - 15.5 %   Platelets 239 150 - 400 K/uL   Neutrophils Relative % 70 %   Neutro Abs 5.5 1.7 - 7.7 K/uL   Lymphocytes Relative 21 %   Lymphs Abs 1.7 0.7 - 4.0 K/uL   Monocytes Relative 7 %   Monocytes Absolute 0.5 0.1 - 1.0 K/uL   Eosinophils Relative 2 %   Eosinophils Absolute 0.1 0.0 - 0.7 K/uL   Basophils Relative 0 %   Basophils Absolute 0.0 0.0 - 0.1 K/uL  Comprehensive metabolic panel     Status: None   Collection Time: 02/27/16  7:29 PM  Result Value Ref Range   Sodium 140 135 - 145 mmol/L   Potassium 4.3 3.5 - 5.1 mmol/L   Chloride 105 101 - 111 mmol/L   CO2 26 22 - 32 mmol/L   Glucose, Bld 92 65 - 99 mg/dL   BUN 18 6 - 20 mg/dL   Creatinine, Ser 0.90 0.44 - 1.00 mg/dL   Calcium 9.7 8.9 - 10.3 mg/dL   Total Protein 6.9 6.5 - 8.1 g/dL   Albumin 4.1 3.5 - 5.0 g/dL   AST 18 15 - 41 U/L   ALT  18 14 - 54 U/L   Alkaline Phosphatase 56 38 - 126 U/L   Total Bilirubin 0.6 0.3 - 1.2 mg/dL   GFR calc non Af Amer >60 >60 mL/min   GFR calc Af Amer >60 >60 mL/min    Comment: (NOTE) The eGFR has been calculated using the CKD EPI equation. This calculation has not been validated in all clinical situations. eGFR's persistently <60 mL/min signify possible Chronic Kidney Disease.    Anion gap 9 5 - 15  I-Stat Troponin, ED (not at [facility])     Status: None   Collection Time: 02/27/16  8:07 PM  Result Value Ref Range   Troponin i, poc 0.00 0.00 - 0.08 ng/mL   Comment 3            Comment: Due to the release kinetics of cTnI, a negative result within the first hours of the onset of symptoms does not rule out myocardial infarction with certainty. If myocardial infarction is still suspected, repeat the test at appropriate intervals.   Urinalysis, Routine w reflex microscopic     Status: Abnormal   Collection Time: 02/27/16  9:50 PM  Result  Value Ref Range   Color, Urine YELLOW YELLOW   APPearance CLEAR CLEAR   Specific Gravity, Urine 1.015 1.005 - 1.030   pH 5.0 5.0 - 8.0   Glucose, UA NEGATIVE NEGATIVE mg/dL   Hgb urine dipstick NEGATIVE NEGATIVE   Bilirubin Urine NEGATIVE NEGATIVE   Ketones, ur 5 (A) NEGATIVE mg/dL   Protein, ur NEGATIVE NEGATIVE mg/dL   Nitrite NEGATIVE NEGATIVE   Leukocytes, UA SMALL (A) NEGATIVE   RBC / HPF 0-5 0 - 5 RBC/hpf   WBC, UA 0-5 0 - 5 WBC/hpf   Bacteria, UA RARE (A) NONE SEEN   Squamous Epithelial / LPF 0-5 (A) NONE SEEN   Mucous PRESENT    Dg Chest 2 View  Result Date: 02/27/2016 CLINICAL DATA:  Mental status changes. EXAM: CHEST  2 VIEW COMPARISON:  06/22/2012 FINDINGS: The lungs are clear wiithout focal pneumonia, edema, pneumothorax or pleural effusion. Chronic atelectasis or scarring left base. The cardiopericardial silhouette is within normal limits for size. The visualized bony structures of the thorax are intact. IMPRESSION: No acute findings. Electronically Signed   By: Misty Stanley M.D.   On: 02/27/2016 20:39   Ct Head Wo Contrast  Result Date: 02/27/2016 CLINICAL DATA:  Acute onset of altered mental status. Initial encounter. EXAM: CT HEAD WITHOUT CONTRAST TECHNIQUE: Contiguous axial images were obtained from the base of the skull through the vertex without intravenous contrast. COMPARISON:  None. FINDINGS: Brain: No evidence of acute infarction, hemorrhage, hydrocephalus, extra-axial collection or mass lesion/mass effect. Mild periventricular white matter change likely reflects small vessel ischemic microangiopathy. The posterior fossa, including the cerebellum, brainstem and fourth ventricle, is within normal limits. The third and lateral ventricles, and basal ganglia are unremarkable in appearance. The cerebral hemispheres are symmetric in appearance, with normal gray-white differentiation. No mass effect or midline shift is seen. Vascular: No hyperdense vessel or unexpected  calcification. Skull: There is no evidence of fracture; visualized osseous structures are unremarkable in appearance. Sinuses/Orbits: The orbits are within normal limits. There is partial opacification of the right mastoid air cells. The paranasal sinuses and left mastoid air cells are well-aerated. Other: No significant soft tissue abnormalities are seen. IMPRESSION: 1. No acute intracranial pathology seen on CT. 2. Mild small vessel ischemic microangiopathy. 3. Partial opacification of the right mastoid air cells. Electronically Signed  By: Garald Balding M.D.   On: 02/27/2016 20:21    Assessment: 76 y.o. female presenting with altered mental status and speech output difficulty transiently. Description of her abnormalities is consistent with probable complex partial seizure. As well, onset of symptoms indicates possible TIA, with subsequent development of partial seizure activity.  Stroke Risk Factors - hypertension  Plan: 1. HgbA1c, fasting lipid panel 2. MRI, MRA  of the brain without contrast 3. PT consult, OT consult, Speech consult 4. Echocardiogram 5. Carotid dopplers 6. Prophylactic therapy-Antiplatelet med: Aspirin  7. Risk factor modification 8. Telemetry monitoring 9. EEG, routine adult study  C.R. Nicole Kindred, MD [facility] [phone]  02/27/2016, 11:14 PM

## 2016-02-27 NOTE — ED Notes (Signed)
Patient transported to CT 

## 2016-02-27 NOTE — ED Notes (Signed)
Provider at bedside

## 2016-02-27 NOTE — ED Triage Notes (Signed)
Pt. Coming from home via [facility] for aphasia and right gaze starting at 1705. Pt. Sitting with family when they noticed she gazed off and became non-verbal. EMS reports pt. Using inappropriate wording when they arrived. En route pt. Symptoms resolved. Pt. sts she remembers entire episode and sts the vision was gone from her right eye during. EMS denies any other neuro symptoms. Pt. Aox4 and ambulatory upon arrival.

## 2016-02-27 NOTE — H&P (Signed)
History and Physical    Carol Gamble GNF:[id number] DOB: 1940-09-03 DOA: 02/27/2016  PCP: Thora Lance, MD   Patient coming from: HOME  Chief Complaint: Right gaze preference, expressive aphasia, transient confusion  HPI: Carol Gamble is a 76 y.o. woman with a history of HTN, Hypercholesterolemia, and osteoarthritis who feels that she was in her baseline state of health until about 5:05pm this afternoon.  The patient had just returned from the grocery store.  She was with one of he son, who witnessed acute onset right gaze preference associated with word finding difficulty and the inability to express her thoughts.  She was awake but staring off and "unresponsive" for 10-15 minutes.  911 was called immediately.  Before EMS arrived, the patient still seemed confused according to her son, but she could report that she was having a visual field deficit (she could only see half of his face) and she clearly stated that she needed to go to the restroom.  She was able to get up and to with the assistance of her husband.  No convulsions.  No bowel or bladder incontinence.  She has had a headache since this episode.  No nausea or vomiting.  No focal weakness.  According to her son, she was better but still not responding appropriately by the time EMS arrived.  Her symptoms completely resolved in the ambulance on the way to the hospital.  In all, family estimates that this entire episode lasted about 25 minutes.  ED Course: Head CT negative for acute CVA.  Small vessel disease and partial opacification of the right mastoid air cells noted.  Chest xray negative for acute process.  EKG shows NSR.  Labs unremarkable.  Hospitalist asked to admit.  Neurology consult requested.  Review of Systems: As per HPI otherwise 10 point review of systems negative.    Past Medical History:  Diagnosis Date  . Arthritis    HNP- low back   . Complication of anesthesia    pt. reports that when she wakes up she  had a hard time catching her breath & the PACU team told her that her BP & pulse were not doing what it was suppose to do  . Headache(784.0)   . History of asthma    childhood, age 25  . History of kidney stones about 2008ish  . Hypertension    states she always has palpitations   . PONV (postoperative nausea and vomiting)   . Shortness of breath    with exertion    Past Surgical History:  Procedure Laterality Date  . ABDOMINAL HYSTERECTOMY     1993  . BACK SURGERY     2000,01,10  . CYSTOSCOPY/RETROGRADE/URETEROSCOPY/STONE EXTRACTION WITH BASKET Left 03/01/2013   Procedure: CYSTOSCOPY/RETROGRADE/URETEROSCOPY/STONE EXTRACTION WITH BASKET/INSERTION LEFT DOUBLE  J STENT;  Surgeon: Su Grand, MD;  Location: [facility];  Service: Urology;  Laterality: Left;  . FINGER MASS EXCISION  2008  . FOOT TENDON SURGERY  1985  . HOLMIUM LASER APPLICATION Left 03/01/2013   Procedure: HOLMIUM LASER APPLICATION;  Surgeon: Su Grand, MD;  Location: [facility];  Service: Urology;  Laterality: Left;  . INNER EAR SURGERY     x2  . ROTATOR CUFF REPAIR     both shoulders done, last one - 2010  . TONSILLECTOMY       reports that she is a non-smoker but has been exposed to tobacco smoke. She has been exposed to tobacco smoke for the past 30.00 years. She has never used  smokeless tobacco. She reports that she does not drink alcohol or use drugs.  She is married.  She has three children, six grandchildren, and one great grandchild.  Allergies  Allergen Reactions  . Shrimp [Shellfish Allergy] Hives    FAMILY HISTORY: One brother died of complications related to a brain aneurysm at age 27.  Otherwise, she denies any known family history of CVA, TIA, brain malignancy, or other CNS conditions.   Her father had heart disease.  Prior to Admission medications   Medication Sig Start Date End Date Taking? Authorizing Provider  atenolol (TENORMIN) 50 MG tablet Take 50 mg by mouth daily with breakfast.   Yes Historical  Provider, MD  CALCIUM-VITAMIN D PO Take 1 tablet by mouth 2 (two) times daily.   Yes Historical Provider, MD  diphenhydramine-acetaminophen (TYLENOL PM) 25-500 MG TABS tablet Take 1 tablet by mouth at bedtime as needed.   Yes Historical Provider, MD  fluticasone (FLONASE) 50 MCG/ACT nasal spray Place 2 sprays into both nostrils daily as needed for allergies or rhinitis.    Yes Historical Provider, MD  hydroxypropyl methylcellulose (ISOPTO TEARS) 2.5 % ophthalmic solution Place 2 drops into both eyes 3 (three) times daily as needed for dry eyes.    Yes Historical Provider, MD  ibuprofen (ADVIL,MOTRIN) 200 MG tablet Take 400 mg by mouth daily as needed for mild pain.   Yes Historical Provider, MD  lisinopril-hydrochlorothiazide (PRINZIDE,ZESTORETIC) 20-12.5 MG per tablet Take 1 tablet by mouth daily with breakfast.   Yes Historical Provider, MD  Multiple Vitamin (MULTIVITAMIN WITH MINERALS) TABS Take 1 tablet by mouth daily.   Yes Historical Provider, MD  naproxen sodium (ANAPROX) 220 MG tablet Take 440 mg by mouth daily as needed (back pain).   Yes Historical Provider, MD  Omega-3 Fatty Acids (FISH OIL) 1200 MG CPDR Take 1 capsule by mouth 2 (two) times daily.   Yes Historical Provider, MD  omeprazole (PRILOSEC) 20 MG capsule Take 20 mg by mouth daily. 02/03/16  Yes Historical Provider, MD  simvastatin (ZOCOR) 40 MG tablet Take 40 mg by mouth at bedtime.   Yes Historical Provider, MD  HYDROcodone-acetaminophen (NORCO) 5-325 MG per tablet Take 1 tablet by mouth every 6 (six) hours as needed for moderate pain. Patient not taking: Reported on 02/27/2016 03/01/13   Su Grand, MD  oxyCODONE-acetaminophen (PERCOCET) 5-325 MG per tablet Take 2 tablets by mouth every 4 (four) hours as needed. Patient not taking: Reported on 02/27/2016 03/01/13   Geoffery Lyons, MD    Physical Exam: Vitals:   02/27/16 2000 02/27/16 2130 02/27/16 2200 02/27/16 2325  BP: 173/90  143/74 (!) 153/61  Pulse: 72  63 64  Resp:   15     Temp:  97.8 F (36.6 C)  97.8 F (36.6 C)  TempSrc:    Oral  SpO2: 98%  100% 100%  Weight:    91.7 kg (202 lb 2.6 oz)  Height:    5\' 2"  (1.575 m)      Constitutional: NAD, calm, comfortable Vitals:   02/27/16 2000 02/27/16 2130 02/27/16 2200 02/27/16 2325  BP: 173/90  143/74 (!) 153/61  Pulse: 72  63 64  Resp:   15   Temp:  97.8 F (36.6 C)  97.8 F (36.6 C)  TempSrc:    Oral  SpO2: 98%  100% 100%  Weight:    91.7 kg (202 lb 2.6 oz)  Height:    5\' 2"  (1.575 m)   Eyes: PERRL, lids and conjunctivae normal  ENMT: Mucous membranes are moist. Posterior pharynx clear of any exudate or lesions. Normal dentition.  Neck: normal appearance, supple, no masses Respiratory: clear to auscultation bilaterally, no wheezing, no crackles. Normal respiratory effort. No accessory muscle use.  Cardiovascular: Normal rate, regular rhythm, no murmurs / rubs / gallops. No extremity edema. 2+ pedal pulses. No carotid bruits.  GI: abdomen is soft and compressible.  No distention.  No tenderness.  Bowel sounds are present. Musculoskeletal:  No joint deformity in upper and lower extremities. Good ROM, no contractures. Normal muscle tone.  Skin: no rashes, warm and dry Neurologic: CN 2-12 grossly intact. Sensation intact, Strength symmetric bilaterally, 5/5.  No pronator drift.  No past pointing.  Gross motor and fine motor skills intact in upper and lower extremities.  Psychiatric: Normal judgment and insight. Alert and oriented x 3. Normal mood.     Labs on Admission: I have personally reviewed following labs and imaging studies  CBC:  Recent Labs Lab 02/27/16 1929  WBC 7.8  NEUTROABS 5.5  HGB 14.3  HCT 43.1  MCV 90.0  PLT 239   Basic Metabolic Panel:  Recent Labs Lab 02/27/16 1929  NA 140  K 4.3  CL 105  CO2 26  GLUCOSE 92  BUN 18  CREATININE 0.90  CALCIUM 9.7   GFR: Estimated Creatinine Clearance: 57.7 mL/min (by C-G formula based on SCr of 0.9 mg/dL). Liver Function  Tests:  Recent Labs Lab 02/27/16 1929  AST 18  ALT 18  ALKPHOS 56  BILITOT 0.6  PROT 6.9  ALBUMIN 4.1   Urine analysis:    Component Value Date/Time   COLORURINE YELLOW 02/27/2016 2150   APPEARANCEUR CLEAR 02/27/2016 2150   LABSPEC 1.015 02/27/2016 2150   PHURINE 5.0 02/27/2016 2150   GLUCOSEU NEGATIVE 02/27/2016 2150   HGBUR NEGATIVE 02/27/2016 2150   BILIRUBINUR NEGATIVE 02/27/2016 2150   KETONESUR 5 (A) 02/27/2016 2150   PROTEINUR NEGATIVE 02/27/2016 2150   UROBILINOGEN 0.2 03/01/2013 0834   NITRITE NEGATIVE 02/27/2016 2150   LEUKOCYTESUR SMALL (A) 02/27/2016 2150   Radiological Exams on Admission: Dg Chest 2 View  Result Date: 02/27/2016 CLINICAL DATA:  Mental status changes. EXAM: CHEST  2 VIEW COMPARISON:  06/22/2012 FINDINGS: The lungs are clear wiithout focal pneumonia, edema, pneumothorax or pleural effusion. Chronic atelectasis or scarring left base. The cardiopericardial silhouette is within normal limits for size. The visualized bony structures of the thorax are intact. IMPRESSION: No acute findings. Electronically Signed   By: Kennith Center M.D.   On: 02/27/2016 20:39   Ct Head Wo Contrast  Result Date: 02/27/2016 CLINICAL DATA:  Acute onset of altered mental status. Initial encounter. EXAM: CT HEAD WITHOUT CONTRAST TECHNIQUE: Contiguous axial images were obtained from the base of the skull through the vertex without intravenous contrast. COMPARISON:  None. FINDINGS: Brain: No evidence of acute infarction, hemorrhage, hydrocephalus, extra-axial collection or mass lesion/mass effect. Mild periventricular white matter change likely reflects small vessel ischemic microangiopathy. The posterior fossa, including the cerebellum, brainstem and fourth ventricle, is within normal limits. The third and lateral ventricles, and basal ganglia are unremarkable in appearance. The cerebral hemispheres are symmetric in appearance, with normal gray-white differentiation. No mass effect  or midline shift is seen. Vascular: No hyperdense vessel or unexpected calcification. Skull: There is no evidence of fracture; visualized osseous structures are unremarkable in appearance. Sinuses/Orbits: The orbits are within normal limits. There is partial opacification of the right mastoid air cells. The paranasal sinuses and left mastoid air cells  are well-aerated. Other: No significant soft tissue abnormalities are seen. IMPRESSION: 1. No acute intracranial pathology seen on CT. 2. Mild small vessel ischemic microangiopathy. 3. Partial opacification of the right mastoid air cells. Electronically Signed   By: Roanna RaiderJeffery  Chang M.D.   On: 02/27/2016 20:21    EKG: Independently reviewed. NSR.  No acute ST segment changes.  Assessment/Plan Principal Problem:   Stroke-like symptoms Active Problems:   TIA (transient ischemic attack)   HTN (hypertension)   Dyslipidemia     Stroke like symptoms.  Self-limited, resolved upon arrival to the ED.  Family seems to describe a post-ictal phase.  Seizure is also on the differential. --Neurology consultation appreciated. --MRI/MRA head and neck without contrast pending --Echo DEFERRED for now; can be done in the outpatient setting --Carotid ultrasound --Check A1c, lipid profile to screen for modifiable risk factors --EEG in the AM --PT/OT/Speech as indicated --Bedside swallow evaluation per protocol  HTN --HOLD atenolol for now to allow for some degree of permissive hypertension until acute CVA is ruled out --Continue home dose of lisinopril HCTZ  HLD --Fasting lipid panel in the AM --Continue home dose of simvastatin  GERD --PPI   DVT prophylaxis: SCDs Code Status: FULL Family Communication: Two sons, one grandson present at bedside in the ED at the time of admission. Disposition Plan: Expect she will go home at discharge. Consults called: Neurology Admission status: Place in observation with telemetry monitoring   TIME SPENT: 60  minutes   Jerene Bearsarter,Tomicka Lover Harrison MD [facility] Pager [phone]  If 7PM-7AM, please contact night-coverage [email or web address] Password [email or web address]  02/27/2016, 11:41 PM

## 2016-02-28 ENCOUNTER — Observation Stay (HOSPITAL_COMMUNITY): Payer: Medicare Other

## 2016-02-28 ENCOUNTER — Observation Stay (HOSPITAL_BASED_OUTPATIENT_CLINIC_OR_DEPARTMENT_OTHER): Payer: Medicare Other

## 2016-02-28 DIAGNOSIS — E785 Hyperlipidemia, unspecified: Secondary | ICD-10-CM | POA: Diagnosis not present

## 2016-02-28 DIAGNOSIS — R2990 Unspecified symptoms and signs involving the nervous system: Secondary | ICD-10-CM | POA: Diagnosis not present

## 2016-02-28 DIAGNOSIS — I10 Essential (primary) hypertension: Secondary | ICD-10-CM | POA: Diagnosis not present

## 2016-02-28 DIAGNOSIS — R569 Unspecified convulsions: Secondary | ICD-10-CM

## 2016-02-28 LAB — LIPID PANEL
CHOLESTEROL: 180 mg/dL (ref 0–200)
HDL: 62 mg/dL (ref >40)
LDL Cholesterol: 97 mg/dL (ref 0–99)
TRIGLYCERIDES: 105 mg/dL (ref <150)
Total CHOL/HDL Ratio: 2.9 RATIO
VLDL: 21 mg/dL (ref 0–40)

## 2016-02-28 MED ORDER — STROKE: EARLY STAGES OF RECOVERY BOOK
Freq: Once | Status: AC
  Administered 2016-02-28: 22:00:00
  Filled 2016-02-28: qty 1

## 2016-02-28 MED ORDER — LORAZEPAM 2 MG/ML IJ SOLN
1.0000 mg | Freq: Once | INTRAMUSCULAR | Status: AC
  Administered 2016-02-28: 1 mg via INTRAVENOUS
  Filled 2016-02-28: qty 1

## 2016-02-28 NOTE — Progress Notes (Signed)
Bedside EEG completed, results pending. 

## 2016-02-28 NOTE — Procedures (Signed)
Electroencephalogram (EEG) Report  Date of study: 02/28/16  Requesting clinician: Michael LitterNikki Carter, MD  Reason for study: Evaluate for seizure  Brief clinical history: This is a 76 year old woman admitted following an episode of acute onset of right gaze preference with word finding difficulty. EEG is now being performed for further evaluation.  Medications:  Current Facility-Administered Medications:  .   stroke: mapping our early stages of recovery book, , Does not apply, Once, Michael LitterNikki Carter, MD .  acetaminophen (TYLENOL) tablet 650 mg, 650 mg, Oral, Q4H PRN **OR** acetaminophen (TYLENOL) solution 650 mg, 650 mg, Per Tube, Q4H PRN **OR** acetaminophen (TYLENOL) suppository 650 mg, 650 mg, Rectal, Q4H PRN, Michael LitterNikki Carter, MD .  aspirin suppository 300 mg, 300 mg, Rectal, Daily **OR** aspirin tablet 325 mg, 325 mg, Oral, Daily, Michael LitterNikki Carter, MD, 325 mg at 02/28/16 0925 .  lisinopril (PRINIVIL,ZESTRIL) tablet 20 mg, 20 mg, Oral, Q breakfast **OR** hydrochlorothiazide (MICROZIDE) capsule 12.5 mg, 12.5 mg, Oral, Q breakfast, Michael LitterNikki Carter, MD .  pantoprazole (PROTONIX) EC tablet 40 mg, 40 mg, Oral, Daily, Michael LitterNikki Carter, MD, 40 mg at 02/28/16 0925 .  simvastatin (ZOCOR) tablet 40 mg, 40 mg, Oral, QHS, Michael LitterNikki Carter, MD, 40 mg at 02/28/16 0041  Description: This is a routine EEG performed using standard international 10-20 electrode placement. A total of 18 channels are recorded, including one for the EKG. Wakefulness, drowsiness, and sleep are recorded.   Activating Maneuvers: Photic stimulation  Findings:  The EKG channel demonstrates a regular rhythm with a rate of 70 beats per minute.   The background consists of well formed alpha activity with mildly reduced voltage at times. The best dominant posterior rhythm is 11-12. This is symmetric and reacts as expected with eye opening.   There is a subtle degree of intermixed slowing in the left temporal region. No epileptiform discharges are present. No  seizures are recorded.   Drowsiness is recorded and is normal in appearance. Early stages of sleep are recorded and demonstrate normal architecture.    Impression:  This is a mildly abnormal EEG due to the presence of a mild degree of intermixed slowing in the left temporal region. No epileptiform activity is seen.  Clinical correlation: This EEG suggests the possibility of mild cortical dysfunction in the left temporal region. This could be consistent with the patient's presenting complaints of word finding difficulty and inability to express her thoughts. Slowing such as seen on this recording is somewhat nonspecific but can be seen with regional structural pathology such as ischemic insult, tumor, or encephalitis. The absence of epileptiform abnormalities does not exclude the possibility of seizure. Clinical correlation is required.   Rhona Leavensimothy Oster, MD [facility]

## 2016-02-28 NOTE — Progress Notes (Signed)
PROGRESS NOTE  Essie HartMavis C Ohm XBJ:[id number] DOB: [birth year] DOA: 02/27/2016 PCP: Thora LanceEHINGER,ROBERT R, MD   LOS: 0 days   Brief Narrative: 76 y.o. woman with a history of HTN, Hypercholesterolemia, and osteoarthritis who feels that she was in her baseline state of health until about 5:05 pm on 1/26. The patient had just returned from the grocery store. She was with one of he son, who witnessed acute onset right gaze preference associated with word finding difficulty and the inability to express her thoughts. This resolved by time she got to the ER.  Assessment & Plan: Principal Problem:   Stroke-like symptoms Active Problems:   TIA (transient ischemic attack)   HTN (hypertension)   Dyslipidemia   TIA  - MRI without contrast negative for acute CVA, discussed with Dr. Roda ShuttersXu from neurology, he would favor doing a contrasted study, this is pending - 2-D echo pending - LDL 97 - Hemoglobin A1c in process - Continue aspirin, continue statin  Hypertension - Hold atenolol  Hyperlipidemia - Continue statin   DVT prophylaxis: SCD Code Status: Full Family Communication: d/w family bedside Disposition Plan: home once workup complete  Consultants:   Neurology   Procedures:   2D echo: pending  Antimicrobials:  None    Subjective: - no chest pain, shortness of breath, no abdominal pain, nausea or vomiting.   Objective: Vitals:   02/28/16 0925 02/28/16 1000 02/28/16 1157 02/28/16 1346  BP: 118/70 131/68 134/67 (!) 148/67  Pulse: 91 90 70 73  Resp: 20 18 18 20   Temp: 99 F (37.2 C)     TempSrc: Oral     SpO2: 98% 98% 98% 97%  Weight:      Height:        Intake/Output Summary (Last 24 hours) at 02/28/16 1406 Last data filed at 02/28/16 0746  Gross per 24 hour  Intake                0 ml  Output              700 ml  Net             -700 ml   Filed Weights   02/27/16 1821 02/27/16 2325  Weight: 93 kg (205 lb) 91.7 kg (202 lb 2.6 oz)   Examination: Constitutional:  NAD Vitals:   02/28/16 0925 02/28/16 1000 02/28/16 1157 02/28/16 1346  BP: 118/70 131/68 134/67 (!) 148/67  Pulse: 91 90 70 73  Resp: 20 18 18 20   Temp: 99 F (37.2 C)     TempSrc: Oral     SpO2: 98% 98% 98% 97%  Weight:      Height:       Eyes: PERRL, lids and conjunctivae normal Respiratory: clear to auscultation bilaterally, no wheezing, no crackles. Normal respiratory effort.  Cardiovascular: Regular rate and rhythm, no murmurs / rubs / gallops. No LE edema.  Abdomen: no tenderness. Bowel sounds positive.  Neurologic: CN 2-12 grossly intact. Strength 5/5 in all 4.  Psychiatric: Normal judgment and insight. Alert and oriented x 3. Normal mood.   Data Reviewed: I have personally reviewed following labs and imaging studies  CBC:  Recent Labs Lab 02/27/16 1929  WBC 7.8  NEUTROABS 5.5  HGB 14.3  HCT 43.1  MCV 90.0  PLT 239   Basic Metabolic Panel:  Recent Labs Lab 02/27/16 1929  NA 140  K 4.3  CL 105  CO2 26  GLUCOSE 92  BUN 18  CREATININE 0.90  CALCIUM 9.7  GFR: Estimated Creatinine Clearance: 57.7 mL/min (by C-G formula based on SCr of 0.9 mg/dL). Liver Function Tests:  Recent Labs Lab 02/27/16 1929  AST 18  ALT 18  ALKPHOS 56  BILITOT 0.6  PROT 6.9  ALBUMIN 4.1   No results for input(s): LIPASE, AMYLASE in the last 168 hours. No results for input(s): AMMONIA in the last 168 hours. Coagulation Profile: No results for input(s): INR, PROTIME in the last 168 hours. Cardiac Enzymes: No results for input(s): CKTOTAL, CKMB, CKMBINDEX, TROPONINI in the last 168 hours. BNP (last 3 results) No results for input(s): PROBNP in the last 8760 hours. HbA1C: No results for input(s): HGBA1C in the last 72 hours. CBG: No results for input(s): GLUCAP in the last 168 hours. Lipid Profile:  Recent Labs  02/28/16 0749  CHOL 180  HDL 62  LDLCALC 97  TRIG 105  CHOLHDL 2.9   Thyroid Function Tests: No results for input(s): TSH, T4TOTAL, FREET4, T3FREE,  THYROIDAB in the last 72 hours. Anemia Panel: No results for input(s): VITAMINB12, FOLATE, FERRITIN, TIBC, IRON, RETICCTPCT in the last 72 hours. Urine analysis:    Component Value Date/Time   COLORURINE YELLOW 02/27/2016 2150   APPEARANCEUR CLEAR 02/27/2016 2150   LABSPEC 1.015 02/27/2016 2150   PHURINE 5.0 02/27/2016 2150   GLUCOSEU NEGATIVE 02/27/2016 2150   HGBUR NEGATIVE 02/27/2016 2150   BILIRUBINUR NEGATIVE 02/27/2016 2150   KETONESUR 5 (A) 02/27/2016 2150   PROTEINUR NEGATIVE 02/27/2016 2150   UROBILINOGEN 0.2 03/01/2013 0834   NITRITE NEGATIVE 02/27/2016 2150   LEUKOCYTESUR SMALL (A) 02/27/2016 2150   Sepsis Labs: Invalid input(s): PROCALCITONIN, LACTICIDVEN  No results found for this or any previous visit (from the past 240 hour(s)).   Radiology Studies: Dg Chest 2 View  Result Date: 02/27/2016 CLINICAL DATA:  Mental status changes. EXAM: CHEST  2 VIEW COMPARISON:  06/22/2012 FINDINGS: The lungs are clear wiithout focal pneumonia, edema, pneumothorax or pleural effusion. Chronic atelectasis or scarring left base. The cardiopericardial silhouette is within normal limits for size. The visualized bony structures of the thorax are intact. IMPRESSION: No acute findings. Electronically Signed   By: Kennith Center M.D.   On: 02/27/2016 20:39   Ct Head Wo Contrast  Result Date: 02/27/2016 CLINICAL DATA:  Acute onset of altered mental status. Initial encounter. EXAM: CT HEAD WITHOUT CONTRAST TECHNIQUE: Contiguous axial images were obtained from the base of the skull through the vertex without intravenous contrast. COMPARISON:  None. FINDINGS: Brain: No evidence of acute infarction, hemorrhage, hydrocephalus, extra-axial collection or mass lesion/mass effect. Mild periventricular white matter change likely reflects small vessel ischemic microangiopathy. The posterior fossa, including the cerebellum, brainstem and fourth ventricle, is within normal limits. The third and lateral  ventricles, and basal ganglia are unremarkable in appearance. The cerebral hemispheres are symmetric in appearance, with normal gray-white differentiation. No mass effect or midline shift is seen. Vascular: No hyperdense vessel or unexpected calcification. Skull: There is no evidence of fracture; visualized osseous structures are unremarkable in appearance. Sinuses/Orbits: The orbits are within normal limits. There is partial opacification of the right mastoid air cells. The paranasal sinuses and left mastoid air cells are well-aerated. Other: No significant soft tissue abnormalities are seen. IMPRESSION: 1. No acute intracranial pathology seen on CT. 2. Mild small vessel ischemic microangiopathy. 3. Partial opacification of the right mastoid air cells. Electronically Signed   By: Roanna Raider M.D.   On: 02/27/2016 20:21   Mr Brain Wo Contrast  Result Date: 02/28/2016 CLINICAL DATA:  Initial evaluation for acute speech difficulty, concern for stroke. EXAM: MRI HEAD WITHOUT CONTRAST MRA HEAD WITHOUT CONTRAST TECHNIQUE: Multiplanar, multiecho pulse sequences of the brain and surrounding structures were obtained without intravenous contrast. Angiographic images of the head were obtained using MRA technique without contrast. COMPARISON:  Prior CT from 1/20 02/27/2016. FINDINGS: MRI HEAD FINDINGS Brain: Cerebral volume within normal limits for age. Minimal T2/FLAIR hyperintensity within the periventricular white matter, most likely related to chronic small vessel ischemic disease, felt to be within normal limits for age as well. No abnormal foci of restricted diffusion to suggest acute or subacute ischemia. Gray-white matter differentiation maintained. No evidence for acute or chronic intracranial hemorrhage. No evidence for chronic infarction. No mass lesion, midline shift, or mass effect. No hydrocephalus. No extra-axial fluid collection. Major dural sinuses are grossly patent. Pituitary gland and suprasellar  region within normal limits. Vascular: Major intracranial vascular flow voids are maintained. Skull and upper cervical spine: All craniocervical junction normal. Visualized upper cervical spine unremarkable. Bone marrow signal intensity within normal limits. No scalp soft tissue abnormality. Sinuses/Orbits: Globes and orbital soft tissues within normal limits. Paranasal sinuses are clear. Small bilateral mastoid effusions noted, right greater than left, of doubtful clinical significance. Inner ear structures grossly normal. MRA HEAD FINDINGS ANTERIOR CIRCULATION: Study somewhat degraded by motion artifact. Distal cervical segments of the internal carotid arteries are patent with antegrade flow. Petrous, cavernous, and supraclinoid segments patent without flow-limiting stenosis. A1 segments patent. Anterior communicating artery normal. Anterior cerebral arteries patent to their distal aspects. Irregularity within the ACA is favored to be motion related. M1 segments widely patent without stenosis or occlusion. MCA bifurcations grossly normal. Distal MCA branches well opacified and symmetric. POSTERIOR CIRCULATION: Vertebral arteries widely patent to the vertebrobasilar junction. Posterior inferior cerebral arteries patent proximally. Basilar artery widely patent. Superior cerebellar and posterior cerebral arteries patent bilaterally. No aneurysm or vascular malformation. IMPRESSION: 1. Normal MRI of the brain. No evidence for acute infarct or other abnormality. 2. Normal intracranial MRA. Electronically Signed   By: Rise Mu M.D.   On: 02/28/2016 04:02   Mr Maxine Glenn Head/brain VW Cm  Result Date: 02/28/2016 CLINICAL DATA:  Initial evaluation for acute speech difficulty, concern for stroke. EXAM: MRI HEAD WITHOUT CONTRAST MRA HEAD WITHOUT CONTRAST TECHNIQUE: Multiplanar, multiecho pulse sequences of the brain and surrounding structures were obtained without intravenous contrast. Angiographic images of the  head were obtained using MRA technique without contrast. COMPARISON:  Prior CT from 1/20 02/27/2016. FINDINGS: MRI HEAD FINDINGS Brain: Cerebral volume within normal limits for age. Minimal T2/FLAIR hyperintensity within the periventricular white matter, most likely related to chronic small vessel ischemic disease, felt to be within normal limits for age as well. No abnormal foci of restricted diffusion to suggest acute or subacute ischemia. Gray-white matter differentiation maintained. No evidence for acute or chronic intracranial hemorrhage. No evidence for chronic infarction. No mass lesion, midline shift, or mass effect. No hydrocephalus. No extra-axial fluid collection. Major dural sinuses are grossly patent. Pituitary gland and suprasellar region within normal limits. Vascular: Major intracranial vascular flow voids are maintained. Skull and upper cervical spine: All craniocervical junction normal. Visualized upper cervical spine unremarkable. Bone marrow signal intensity within normal limits. No scalp soft tissue abnormality. Sinuses/Orbits: Globes and orbital soft tissues within normal limits. Paranasal sinuses are clear. Small bilateral mastoid effusions noted, right greater than left, of doubtful clinical significance. Inner ear structures grossly normal. MRA HEAD FINDINGS ANTERIOR CIRCULATION: Study somewhat degraded by motion artifact. Distal cervical  segments of the internal carotid arteries are patent with antegrade flow. Petrous, cavernous, and supraclinoid segments patent without flow-limiting stenosis. A1 segments patent. Anterior communicating artery normal. Anterior cerebral arteries patent to their distal aspects. Irregularity within the ACA is favored to be motion related. M1 segments widely patent without stenosis or occlusion. MCA bifurcations grossly normal. Distal MCA branches well opacified and symmetric. POSTERIOR CIRCULATION: Vertebral arteries widely patent to the vertebrobasilar  junction. Posterior inferior cerebral arteries patent proximally. Basilar artery widely patent. Superior cerebellar and posterior cerebral arteries patent bilaterally. No aneurysm or vascular malformation. IMPRESSION: 1. Normal MRI of the brain. No evidence for acute infarct or other abnormality. 2. Normal intracranial MRA. Electronically Signed   By: Rise Mu M.D.   On: 02/28/2016 04:02   Scheduled Meds: .  stroke: mapping our early stages of recovery book   Does not apply Once  . aspirin  300 mg Rectal Daily   Or  . aspirin  325 mg Oral Daily  . lisinopril  20 mg Oral Q breakfast   Or  . hydrochlorothiazide  12.5 mg Oral Q breakfast  . pantoprazole  40 mg Oral Daily  . simvastatin  40 mg Oral QHS   Continuous Infusions:  Pamella Pert, MD, PhD [facility] Pager [phone]  If 7PM-7AM, please contact night-coverage [email or web address] Password [email or web address] 02/28/2016, 2:06 PM

## 2016-02-28 NOTE — Progress Notes (Signed)
VASCULAR LAB PRELIMINARY  PRELIMINARY  PRELIMINARY  PRELIMINARY  Carotid duplex completed.    Preliminary report:  1-39% ICA plaquing. Vertebral artery flow is antegrade.   Cherese Lozano, RVT 02/28/2016, 5:29 PM

## 2016-02-28 NOTE — Progress Notes (Signed)
STROKE TEAM PROGRESS NOTE   HISTORY OF PRESENT ILLNESS (per record) Carol Gamble is an 76 y.o. female history of hypertension, asthma, and arthritis, brought to the ED following acute onset of difficulty with speech output, followed by staring and unresponsiveness. Patient recalls initial symptoms and knew what he wanted to say but was unable to form the desired words. Shortly afterwards family noted that she developed a blank stare and did not respond to them at all. She exhibited behavior consistent with automatisms. When she started to recover she appeared to be somewhat confused. She recalls being able to see all the the left half of the EMT person's face. Symptoms resolved in route to the ED. CT scan of her head showed no acute intracranial abnormality. NIH stroke score at the time of this evaluation was 0.  LSN: 5:00 PM on 02/27/2016 tPA Given: No: Deficits rapidly resolved mRankin:   SUBJECTIVE (INTERVAL HISTORY) Her husband is at the bedside.  Overall she feels her condition is completely resolved. Pt just had EEG done. Stated that she had no seizure in the past.    OBJECTIVE Temp:  [97.8 F (36.6 C)-98.4 F (36.9 C)] 97.8 F (36.6 C) (01/27 0600) Pulse Rate:  [62-72] 62 (01/27 0800) Cardiac Rhythm: Normal sinus rhythm (01/27 0800) Resp:  [13-18] 18 (01/27 0800) BP: (114-173)/(52-90) 114/62 (01/27 0800) SpO2:  [97 %-100 %] 97 % (01/27 0800) Weight:  [91.7 kg (202 lb 2.6 oz)-93 kg (205 lb)] 91.7 kg (202 lb 2.6 oz) (01/26 2325)  CBC:   Recent Labs Lab 02/27/16 1929  WBC 7.8  NEUTROABS 5.5  HGB 14.3  HCT 43.1  MCV 90.0  PLT 239    Basic Metabolic Panel:   Recent Labs Lab 02/27/16 1929  NA 140  K 4.3  CL 105  CO2 26  GLUCOSE 92  BUN 18  CREATININE 0.90  CALCIUM 9.7    Lipid Panel: No results found for: CHOL, TRIG, HDL, CHOLHDL, VLDL, LDLCALC HgbA1c: No results found for: HGBA1C Urine Drug Screen: No results found for: LABOPIA, COCAINSCRNUR, LABBENZ,  AMPHETMU, THCU, LABBARB    IMAGING I have personally reviewed the radiological images below and agree with the radiology interpretations.  Dg Chest 2 View 02/27/2016 No acute findings.  Ct Head Wo Contrast 02/27/2016 1. No acute intracranial pathology seen on CT.  2. Mild small vessel ischemic microangiopathy.  3. Partial opacification of the right mastoid air cells.   Mr Maxine GlennMra Head/brain Wo Cm 02/28/2016 1. Normal MRI of the brain. No evidence for acute infarct or other abnormality.  2. Normal intracranial MRA.   EEG 02/28/2016 Impression:  This is a mildly abnormal EEG due to the presence of a mild degree of intermixed slowing in the left temporal region. No epileptiform activity is seen. Clinical correlation: This EEG suggests the possibility of mild cortical dysfunction in the left temporal region. This could be consistent with the patient's presenting complaints of word finding difficulty and inability to express her thoughts. Slowing such as seen on this recording is somewhat nonspecific but can be seen with regional structural pathology such as ischemic insult, tumor, or encephalitis. The absence of epileptiform abnormalities does not exclude the possibility of seizure. Clinical correlation is required.  MRI brain with contrast pending  CUS pending  TTE pending   PHYSICAL EXAM  Temp:  [97.8 F (36.6 C)-99 F (37.2 C)] 99 F (37.2 C) (01/27 0925) Pulse Rate:  [62-91] 73 (01/27 1346) Resp:  [13-20] 20 (01/27 1346) BP: (114-173)/(52-90)  148/67 (01/27 1346) SpO2:  [97 %-100 %] 97 % (01/27 1346) Weight:  [202 lb 2.6 oz (91.7 kg)-205 lb (93 kg)] 202 lb 2.6 oz (91.7 kg) (01/26 2325)  General - Well nourished, well developed, in no apparent distress.  Ophthalmologic - Sharp disc margins OU.   Cardiovascular - Regular rate and rhythm.  Mental Status -  Level of arousal and orientation to time, place, and person were intact. Language including expression, naming,  repetition, comprehension was assessed and found intact. Attention span and concentration were normal. Fund of Knowledge was assessed and was intact.  Cranial Nerves II - XII - II - Visual field intact OU III, IV, VI - Extraocular movements intact. V - Facial sensation intact bilaterally. VII - Facial movement intact bilaterally. VIII - Hearing & vestibular intact bilaterally. X - Palate elevates symmetrically. XI - Chin turning & shoulder shrug intact bilaterally. XII - Tongue protrusion intact.  Motor Strength - The patient's strength was normal in all extremities and pronator drift was absent.  Bulk was normal and fasciculations were absent.   Motor Tone - Muscle tone was assessed at the neck and appendages and was normal.  Reflexes - The patient's reflexes were 1+ in all extremities and she had no pathological reflexes.  Sensory - Light touch, temperature/pinprick, vibration and proprioception, and Romberg testing were assessed and were symmetrical.    Coordination - The patient had normal movements in the hands and feet with no ataxia or dysmetria.  Tremor was absent.  Gait and Station - deferred due to EEG.   ASSESSMENT/PLAN Carol Gamble is a 76 y.o. female with history of hypertension, asthma, palpitations, renal calculi, headaches, and arthritis presenting with speech difficulties, staring / unresponsivemild confusion, and post ictal confusion with right hemianopia episodes. She did not receive IV t-PA due to improving deficits.  Possible seizures - with staring spells and postictal confusion  Resultant - resolution of deficits.  MRI - normal  MRA - normal  MRI with contrast - pending  EEG -  No seizure. Mild cortical dysfunction in the left temporal region  Carotid Doppler - pending  2D Echo - pending  LDL - 97  HgbA1c pending  VTE prophylaxis - SCDs Diet Heart Room service appropriate? Yes; Fluid consistency: Thin  No antithrombotic prior to  admission, now on aspirin 325 mg daily. Continue ASA on discharge.   If EEG and MRI no significant abnormality, no AED is warranted at this time.   Patient counseled to be compliant with her antithrombotic medications  Ongoing aggressive stroke risk factor management  Therapy recommendations: None  Disposition:  Pending  Hypertension  Blood pressure mildly low at times  Home med - HCTZ and lisinopril - resumed  Long-term BP goal normotensive  Hyperlipidemia  Home meds: Zocor 40 mg daily resumed in hospital  LDL 97, goal < 100  Continue statin at discharge  Other Stroke Risk Factors  Advanced age  Second hand tobacco smoke exposure 30 years  Obesity, Body mass index is 36.98 kg/m., recommend weight loss, diet and exercise as appropriate   Other Active Problems    Hospital day # 0  Marvel Plan, MD PhD Stroke Neurology 02/28/2016 3:53 PM   To contact Stroke Continuity provider, please refer to [email or web address]. After hours, contact General Neurology

## 2016-02-28 NOTE — Evaluation (Signed)
Occupational Therapy Evaluation/Discharge Patient Details Name: Carol Gamble MRN: [id number] DOB: 06-07-1940 Today's Date: 02/28/2016    History of Present Illness Pt is a 76 y.o. female who presented to the ED via EMS after an episode of visual field deficit, R word finding difficulty, and R gaze preference. Symptoms resolved in ambulance. PMH significant for arthtitis, headache, kidney stones, hypertension, post-operative nausea and vomiting, shortness of breath with exertion, and hypercholesterolemia. CT and MRI reveal no acute abnormalities.   Clinical Impression   PTA, pt was independent with single point cane for functional mobility and ADL. Pt reports all symptoms have resolved at this time. Pt currently at baseline for ADL performance requiring increased time but overall participating at modified independent level. Cognition and vision within functional limits for ADL/IADL tasks assessed. Pt lives with husband who is able to provide 24 hour supervision post-acute D/C. Educated pt/family on medication management strategies and safety during all ADL and she and her husband report and demonstrate understanding. No further acute OT needs identified and no OT follow-up recommended. OT will sign off.    Follow Up Recommendations  No OT follow up;Supervision/Assistance - 24 hour    Equipment Recommendations  None recommended by OT    Recommendations for Other Services       Precautions / Restrictions Restrictions Weight Bearing Restrictions: No      Mobility Bed Mobility Overal bed mobility: Modified Independent             General bed mobility comments: Use of bed rails.  Transfers Overall transfer level: Needs assistance Equipment used: None Transfers: Sit to/from Stand Sit to Stand: Supervision         General transfer comment: General supervision for safety when pt not using cane.    Balance Overall balance assessment: Needs assistance Sitting-balance  support: No upper extremity supported;Feet supported Sitting balance-Leahy Scale: Good     Standing balance support: No upper extremity supported;During functional activity Standing balance-Leahy Scale: Good                              ADL Overall ADL's : Modified independent;At baseline                                       General ADL Comments: Pt requiring increased time for all. Husband reports ADL performance with OT is baseline.     Vision Vision Assessment?: Yes Eye Alignment: Within Functional Limits Ocular Range of Motion: Within Functional Limits Alignment/Gaze Preference: Within Defined Limits Tracking/Visual Pursuits: Able to track stimulus in all quads without difficulty Saccades: Within functional limits Convergence: Within functional limits Visual Fields: No apparent deficits Additional Comments: Visual field deficits from yesterday have resolved per pt report. Able to use central and peripheral vision functionally.    Perception     Praxis      Pertinent Vitals/Pain Pain Assessment: No/denies pain     Hand Dominance Right   Extremity/Trunk Assessment Upper Extremity Assessment Upper Extremity Assessment: Overall WFL for tasks assessed   Lower Extremity Assessment Lower Extremity Assessment: Defer to PT evaluation       Communication Communication Communication: No difficulties   Cognition Arousal/Alertness: Awake/alert Behavior During Therapy: WFL for tasks assessed/performed Overall Cognitive Status: Within Functional Limits for tasks assessed  General Comments       Exercises       Shoulder Instructions      Home Living Family/patient expects to be discharged to:: Private residence Living Arrangements: Spouse/significant other Available Help at Discharge: Family;Available 24 hours/day Type of Home: House Home Access: Stairs to enter [employer] of Steps: 3 in front,  5 in back Entrance Stairs-Rails: Right;Left;Can reach both Home Layout: One level     Bathroom Shower/Tub: Producer, television/film/video: Standard     Home Equipment: Cane - single point          Prior Functioning/Environment Level of Independence: Independent with assistive device(s)        Comments: Uses single point cane for longer distance functional mobility. Only uses in community not in home.        OT Problem List: Impaired balance (sitting and/or standing);Decreased safety awareness   OT Treatment/Interventions:      OT Goals(Current goals can be found in the care plan section) Acute Rehab OT Goals Patient Stated Goal: to go home OT Goal Formulation: With patient/family Time For Goal Achievement: 03/06/16 Potential to Achieve Goals: Good  OT Frequency:     Barriers to D/C:            Co-evaluation              End of Session Nurse Communication: Mobility status  Activity Tolerance: Patient tolerated treatment well Patient left: in bed;with call bell/phone within reach;with family/visitor present   Time: 0940-1008 OT Time Calculation (min): 28 min Charges:  OT General Charges $OT Visit: 1 Procedure OT Evaluation $OT Eval Moderate Complexity: 1 Procedure OT Treatments $Self Care/Home Management : 8-22 mins G-Codes: OT G-codes **NOT FOR INPATIENT CLASS** Functional Assessment Tool Used: clinical judgement Functional Limitation: Self care Self Care Current Status (Z6109): At least 1 percent but less than 20 percent impaired, limited or restricted Self Care Goal Status (U0454): At least 1 percent but less than 20 percent impaired, limited or restricted Self Care Discharge Status [phone]): At least 1 percent but less than 20 percent impaired, limited or restricted  [facility], OTR/L [phone] 02/28/2016, 11:02 AM

## 2016-02-28 NOTE — Evaluation (Addendum)
Physical Therapy Evaluation Patient Details Name: Essie HartMavis C Septer MRN: [id number] DOB: [birth year] Today's Date: 02/28/2016   History of Present Illness  Pt is a 76 y.o. female who presented to the ED via EMS after an episode of visual field deficit, R word finding difficulty, and R gaze preference. Symptoms resolved in ambulance. PMH significant for arthtitis, headache, kidney stones, hypertension, post-operative nausea and vomiting, shortness of breath with exertion, and hypercholesterolemia. CT and MRI reveal no acute abnormalities.  Clinical Impression  Pt is at or close to baseline functioning and should be safe at home with husband's assist/supervision. There are no further acute PT needs.  Will sign off at this time.     Follow Up Recommendations No PT follow up    Equipment Recommendations  None recommended by PT    Recommendations for Other Services       Precautions / Restrictions Precautions Precautions:  (minimal fall risk) Restrictions Weight Bearing Restrictions: No      Mobility  Bed Mobility Overal bed mobility: Modified Independent             General bed mobility comments: Use of bed rails.  Transfers Overall transfer level: Modified independent Equipment used: None Transfers: Sit to/from Stand Sit to Stand: Modified independent (Device/Increase time)         General transfer comment: General supervision for safety when pt not using cane.  Ambulation/Gait Ambulation/Gait assistance: Modified independent (Device/Increase time) Ambulation Distance (Feet): 700 Feet Assistive device: None Gait Pattern/deviations: Step-through pattern   Gait velocity interpretation: >2.62 ft/sec, indicative of independent community ambulator General Gait Details: L foot deformity hinders fluid gait mildly, keeps pt slower, but safe and steady  Stairs Stairs: Yes Stairs assistance: Modified independent (Device/Increase time)   Number of Stairs: 5 General  stair comments: safe with rail  Wheelchair Mobility    Modified Rankin (Stroke Patients Only)       Balance Overall balance assessment: Needs assistance Sitting-balance support: No upper extremity supported;Feet supported Sitting balance-Leahy Scale: Good     Standing balance support: No upper extremity supported;During functional activity Standing balance-Leahy Scale: Good                   Standardized Balance Assessment Standardized Balance Assessment : Dynamic Gait Index   Dynamic Gait Index Level Surface: Normal Change in Gait Speed: Normal Gait with Horizontal Head Turns: Normal Gait with Vertical Head Turns: Normal Gait and Pivot Turn: Normal Step Over Obstacle: Normal Step Around Obstacles: Moderate Impairment Steps: Mild Impairment Total Score: 21       Pertinent Vitals/Pain Pain Assessment: No/denies pain    Home Living Family/patient expects to be discharged to:: Private residence Living Arrangements: Spouse/significant other Available Help at Discharge: Family;Available 24 hours/day Type of Home: House Home Access: Stairs to enter Entrance Stairs-Rails: Right;Left;Can reach both Entrance Stairs-Number of Steps: 3 in front, 5 in back Home Layout: One level Home Equipment: Cane - single point      Prior Function Level of Independence: Independent with assistive device(s)         Comments: Uses single point cane for longer distance functional mobility. Only uses in community not in home.     Hand Dominance   Dominant Hand: Right    Extremity/Trunk Assessment   Upper Extremity Assessment Upper Extremity Assessment: Overall WFL for tasks assessed    Lower Extremity Assessment Lower Extremity Assessment: Overall WFL for tasks assessed (some hip flexor weakness L LE)       Communication  Communication: No difficulties  Cognition Arousal/Alertness: Awake/alert Behavior During Therapy: WFL for tasks assessed/performed Overall  Cognitive Status: Within Functional Limits for tasks assessed                      General Comments      Exercises     Assessment/Plan    PT Assessment Patient needs continued PT services  PT Problem List            PT Treatment Interventions      PT Goals (Current goals can be found in the Care Plan section)  Acute Rehab PT Goals Patient Stated Goal: to go home PT Goal Formulation: With patient    Frequency     Barriers to discharge        Co-evaluation               End of Session   Activity Tolerance: Patient tolerated treatment well Patient left: in bed;with call bell/phone within reach;with family/visitor present Nurse Communication: Mobility status    Functional Assessment Tool Used: clinical judgement Functional Limitation: Mobility: Walking and moving around Mobility: Walking and Moving Around Current Status (W0981): 0 percent impaired, limited or restricted Mobility: Walking and Moving Around Goal Status [phone]): 0 percent impaired, limited or restricted Mobility: Walking and Moving Around Discharge Status [phone]): 0 percent impaired, limited or restricted    Time: 1047-1104 PT Time Calculation (min) (ACUTE ONLY): 17 min   Charges:   PT Evaluation $PT Eval Moderate Complexity: 1 Procedure     PT G Codes:   PT G-Codes **NOT FOR INPATIENT CLASS** Functional Assessment Tool Used: clinical judgement Functional Limitation: Mobility: Walking and moving around Mobility: Walking and Moving Around Current Status (O1308): 0 percent impaired, limited or restricted Mobility: Walking and Moving Around Goal Status (M5784): 0 percent impaired, limited or restricted Mobility: Walking and Moving Around Discharge Status (O9629): 0 percent impaired, limited or restricted    Eliseo Gum Michaline Kindig 02/28/2016, 11:12 AM 02/28/2016  [place], PT [phone]  (pager)

## 2016-02-28 NOTE — Evaluation (Addendum)
Speech Language Pathology Evaluation Patient Details Name: Carol Gamble MRN: [id number] DOB: 02-25-1940 Today's Date: 02/28/2016 Time: [id number] SLP Time Calculation (min) (ACUTE ONLY): 21 min  Problem List:  Patient Active Problem List   Diagnosis Date Noted  . Seizure (HCC)   . TIA (transient ischemic attack) 02/27/2016  . Stroke-like symptoms 02/27/2016  . HTN (hypertension) 02/27/2016  . Dyslipidemia 02/27/2016  . HNP (herniated nucleus pulposus), lumbar 06/30/2012   Past Medical History:  Past Medical History:  Diagnosis Date  . Arthritis    HNP- low back   . Complication of anesthesia    pt. reports that when she wakes up she had a hard time catching her breath & the PACU team told her that her BP & pulse were not doing what it was suppose to do  . Headache(784.0)   . History of asthma    childhood, age 89  . History of kidney stones about 2008ish  . Hypertension    states she always has palpitations   . PONV (postoperative nausea and vomiting)   . Shortness of breath    with exertion   Past Surgical History:  Past Surgical History:  Procedure Laterality Date  . ABDOMINAL HYSTERECTOMY     1993  . BACK SURGERY     2000,01,10  . CYSTOSCOPY/RETROGRADE/URETEROSCOPY/STONE EXTRACTION WITH BASKET Left 03/01/2013   Procedure: CYSTOSCOPY/RETROGRADE/URETEROSCOPY/STONE EXTRACTION WITH BASKET/INSERTION LEFT DOUBLE  J STENT;  Surgeon: Su GrandMarc Nesi, MD;  Location: [facility];  Service: Urology;  Laterality: Left;  . FINGER MASS EXCISION  2008  . FOOT TENDON SURGERY  1985  . HOLMIUM LASER APPLICATION Left 03/01/2013   Procedure: HOLMIUM LASER APPLICATION;  Surgeon: Su GrandMarc Nesi, MD;  Location: [facility];  Service: Urology;  Laterality: Left;  . INNER EAR SURGERY     x2  . ROTATOR CUFF REPAIR     both shoulders done, last one - 2010  . TONSILLECTOMY     HPI:  76 y.o. woman with a history of HTN, Hypercholesterolemia, and osteoarthritis who feels that she was in her baseline state of  health until about 5:05pm this afternoon.  The patient had just returned from the grocery store.  She was with one of he son on 02/27/16, who witnessed acute onset right gaze preference associated with word finding difficulty and the inability to express her thoughts.  CT head negative on 02/27/16; MR head 02/28/16 indicated Normal MRI of the brain. No evidence for acute infarct or other abnormality.  Assessment / Plan / Recommendation Clinical Impression   Pt exhibited language,speech and cognition that were all within functional limits; pt appears to have returned to prior level of functioning and did not have any aphasic components to her conversational speech, eye gaze preference has returned to within normal range and no deficits in other areas noted; ST not recommended at this time.    SLP Assessment  Patient does not need any further Speech Language Pathology Services    Follow Up Recommendations  None    Frequency and Duration   n/a        SLP Evaluation Cognition  Overall Cognitive Status: Within Functional Limits for tasks assessed Arousal/Alertness: Awake/alert Orientation Level: Oriented X4       Comprehension  Auditory Comprehension Overall Auditory Comprehension: Appears within functional limits for tasks assessed Conversation: Complex Reading Comprehension Reading Status: Within funtional limits    Expression Expression Primary Mode of Expression: Verbal Verbal Expression Overall Verbal Expression: Appears within functional limits for tasks assessed Initiation:  No impairment Level of Generative/Spontaneous Verbalization: Conversation Repetition: No impairment Naming: No impairment Pragmatics: No impairment Non-Verbal Means of Communication: Not applicable Written Expression Dominant Hand: Right Written Expression: Not tested   Oral / Motor  Oral Motor/Sensory Function Overall Oral Motor/Sensory Function: Within functional limits Motor Speech Overall Motor  Speech: Appears within functional limits for tasks assessed Respiration: Within functional limits Phonation: Normal Resonance: Within functional limits Articulation: Within functional limitis Intelligibility: Intelligible Motor Planning: Witnin functional limits Motor Speech Errors: Not applicable             Functional Assessment Tool Used:  (NOMS) Functional Limitations: Other Speech Language Pathology Other Speech-Language Pathology Functional Limitation [phone]): 0 percent impaired, limited or restricted         ADAMS,PAT, M.S., CCC-SLP 02/28/2016, 4:01 PM

## 2016-02-28 NOTE — Progress Notes (Signed)
Patient left unit to vascular

## 2016-02-29 ENCOUNTER — Observation Stay (HOSPITAL_COMMUNITY): Payer: Medicare Other

## 2016-02-29 ENCOUNTER — Encounter (HOSPITAL_COMMUNITY): Payer: Self-pay

## 2016-02-29 DIAGNOSIS — E785 Hyperlipidemia, unspecified: Secondary | ICD-10-CM | POA: Diagnosis not present

## 2016-02-29 DIAGNOSIS — I10 Essential (primary) hypertension: Secondary | ICD-10-CM | POA: Diagnosis not present

## 2016-02-29 DIAGNOSIS — R2990 Unspecified symptoms and signs involving the nervous system: Secondary | ICD-10-CM | POA: Diagnosis not present

## 2016-02-29 LAB — HEMOGLOBIN A1C
Hgb A1c MFr Bld: 5.6 % (ref 4.8–5.6)
Mean Plasma Glucose: 114 mg/dL

## 2016-02-29 LAB — RAPID URINE DRUG SCREEN, HOSP PERFORMED
Amphetamines: NOT DETECTED
BARBITURATES: NOT DETECTED
BENZODIAZEPINES: POSITIVE — AB
COCAINE: NOT DETECTED
Opiates: NOT DETECTED
TETRAHYDROCANNABINOL: NOT DETECTED

## 2016-02-29 LAB — ECHOCARDIOGRAM COMPLETE
Height: 62 in
WEIGHTICAEL: 3234.59 [oz_av]

## 2016-02-29 MED ORDER — ASPIRIN EC 81 MG PO TBEC: 81.0000 mg | DELAYED_RELEASE_TABLET | Freq: Every day | ORAL | Status: DC

## 2016-02-29 MED ORDER — LEVETIRACETAM 500 MG PO TABS: 500.0000 mg | ORAL_TABLET | Freq: Two times a day (BID) | ORAL | 1 refills | Status: DC

## 2016-02-29 MED ORDER — ASPIRIN 81 MG PO TBEC: 81.0000 mg | DELAYED_RELEASE_TABLET | Freq: Every day | ORAL | 1 refills | Status: DC

## 2016-02-29 MED ORDER — LEVETIRACETAM 500 MG PO TABS: 500.0000 mg | ORAL_TABLET | Freq: Two times a day (BID) | ORAL | Status: DC

## 2016-02-29 MED ORDER — LEVETIRACETAM 500 MG PO TABS
1000.0000 mg | ORAL_TABLET | Freq: Once | ORAL | Status: AC
  Administered 2016-02-29: 1000 mg via ORAL
  Filled 2016-02-29: qty 2

## 2016-02-29 MED ORDER — GADOBENATE DIMEGLUMINE 529 MG/ML IV SOLN
20.0000 mL | Freq: Once | INTRAVENOUS | Status: AC | PRN
  Administered 2016-02-29: 20 mL via INTRAVENOUS

## 2016-02-29 NOTE — Progress Notes (Signed)
RN discussed discharge instructions. She vocalized understanding of keppra dosage, route, indication, frequency. Understands she must take next dose of keppra tonight at 10 pm, keppra must be taken twice daily 12 hrs apart, understands she may not stop taking this medication unless directed by md, understands she may not drive or perform any hazardous activities until cleared by MD. States she will educate her friends to call 911, lay patient on floor, turn pt on side, stay with patient until safe. Husband already educated by rn. Given prescriptions and discharge education. Neuro assessment unchanged, denies any pain. Patient is dressing and waiting for transportation to arrive.

## 2016-02-29 NOTE — Discharge Summary (Signed)
Physician Discharge Summary  Carol Gamble:[id number] DOB: [birth year] DOA: 02/27/2016  PCP: Thora Lance, MD  Admit date: 02/27/2016 Discharge date: 02/29/2016  Admitted From: home Disposition:  home  Recommendations for Outpatient Follow-up:  1. Follow up with PCP in 1-2 weeks 2. Patient started on Kepprs 3. No driving for 6 months recommended on d/c  Home Health: none Equipment/Devices: none  Discharge Condition: stable CODE STATUS: Full code Diet recommendation: heart healthy  HPI: Per Dr. Montez Morita, Carol Gamble is a 76 y.o. woman with a history of HTN, Hypercholesterolemia, and osteoarthritis who feels that she was in her baseline state of health until about 5:05pm this afternoon.  The patient had just returned from the grocery store.  She was with one of he son, who witnessed acute onset right gaze preference associated with word finding difficulty and the inability to express her thoughts.  She was awake but staring off and "unresponsive" for 10-15 minutes.  911 was called immediately.  Before EMS arrived, the patient still seemed confused according to her son, but she could report that she was having a visual field deficit (she could only see half of his face) and she clearly stated that she needed to go to the restroom.  She was able to get up and to with the assistance of her husband.  No convulsions.  No bowel or bladder incontinence.  She has had a headache since this episode.  No nausea or vomiting.  No focal weakness.  According to her son, she was better but still not responding appropriately by the time EMS arrived.  Her symptoms completely resolved in the ambulance on the way to the hospital.  In all, family estimates that this entire episode lasted about 25 minutes.  Hospital Course: Discharge Diagnoses:  Principal Problem:   Stroke-like symptoms Active Problems:   TIA (transient ischemic attack)   HTN (hypertension)   Dyslipidemia   Seizure  (HCC)   Seizures - agent was admitted to the hospital for CVA versus seizure episode at home. Neurology was consulted and followed patient while hospitalized. She underwent an MRI which was negative for acute CVA, however showed a subtle finding in the left hippocampus which may be related to the seizure focus. EEG as well as suggested a mild cortical dysfunction in the left temporal region. Given these findings, a neurologist patient on Keppra for potential seizures and she was advised not to drive for 6 months. She completed the stroke workup with a 2-D echo which was essentially normal with an EF of 55-60%, as well as carotids which did not show any significant obstruction. She was advised to continue aspirin on discharge. She is already on statin.  Hypertension - resume home medications Hyperlipidemia - Continue statin   Discharge Instructions  Discharge Instructions    Ambulatory referral to Neurology    Complete by:  As directed    Follow up with NP Darrol Angel at [facility] in about 2 months. Thanks.     Allergies as of 02/29/2016      Reactions   Shrimp [shellfish Allergy] Hives      Medication List    TAKE these medications   aspirin 81 MG EC tablet Take 1 tablet (81 mg total) by mouth daily. Start taking on:  03/01/2016   atenolol 50 MG tablet Commonly known as:  TENORMIN Take 50 mg by mouth daily with breakfast.   CALCIUM-VITAMIN D PO Take 1 tablet by mouth 2 (two) times daily.   diphenhydramine-acetaminophen  25-500 MG Tabs tablet Commonly known as:  TYLENOL PM Take 1 tablet by mouth at bedtime as needed.   Fish Oil 1200 MG Cpdr Take 1 capsule by mouth 2 (two) times daily.   fluticasone 50 MCG/ACT nasal spray Commonly known as:  FLONASE Place 2 sprays into both nostrils daily as needed for allergies or rhinitis.   HYDROcodone-acetaminophen 5-325 MG tablet Commonly known as:  NORCO Take 1 tablet by mouth every 6 (six) hours as needed for moderate pain.    hydroxypropyl methylcellulose / hypromellose 2.5 % ophthalmic solution Commonly known as:  ISOPTO TEARS / GONIOVISC Place 2 drops into both eyes 3 (three) times daily as needed for dry eyes.   ibuprofen 200 MG tablet Commonly known as:  ADVIL,MOTRIN Take 400 mg by mouth daily as needed for mild pain.   levETIRAcetam 500 MG tablet Commonly known as:  KEPPRA Take 1 tablet (500 mg total) by mouth 2 (two) times daily.   lisinopril-hydrochlorothiazide 20-12.5 MG tablet Commonly known as:  PRINZIDE,ZESTORETIC Take 1 tablet by mouth daily with breakfast.   multivitamin with minerals Tabs tablet Take 1 tablet by mouth daily.   naproxen sodium 220 MG tablet Commonly known as:  ANAPROX Take 440 mg by mouth daily as needed (back pain).   omeprazole 20 MG capsule Commonly known as:  PRILOSEC Take 20 mg by mouth daily.   oxyCODONE-acetaminophen 5-325 MG tablet Commonly known as:  PERCOCET Take 2 tablets by mouth every 4 (four) hours as needed.   simvastatin 40 MG tablet Commonly known as:  ZOCOR Take 40 mg by mouth at bedtime.      Follow-up Information    Thora Lance, MD. Schedule an appointment as soon as possible for a visit in 2 week(s).   Specialty:  Family Medicine Contact information: [address] [place] [address] [phone]        [name], NP. Schedule an appointment as soon as possible for a visit in 6 week(s).   Specialty:  Family Medicine Contact information: [address] [place] [address] [phone]          Allergies  Allergen Reactions  . Shrimp [Shellfish Allergy] Hives    Consultations:  Neurology   Procedures/Studies:  2D echo  EEG  Carotid doppler  Dg Chest 2 View  Result Date: 02/27/2016 CLINICAL DATA:  Mental status changes. EXAM: CHEST  2 VIEW COMPARISON:  06/22/2012 FINDINGS: The lungs are clear wiithout focal pneumonia, edema, pneumothorax or pleural effusion. Chronic  atelectasis or scarring left base. The cardiopericardial silhouette is within normal limits for size. The visualized bony structures of the thorax are intact. IMPRESSION: No acute findings. Electronically Signed   By: Kennith Center M.D.   On: 02/27/2016 20:39   Ct Head Wo Contrast  Result Date: 02/27/2016 CLINICAL DATA:  Acute onset of altered mental status. Initial encounter. EXAM: CT HEAD WITHOUT CONTRAST TECHNIQUE: Contiguous axial images were obtained from the base of the skull through the vertex without intravenous contrast. COMPARISON:  None. FINDINGS: Brain: No evidence of acute infarction, hemorrhage, hydrocephalus, extra-axial collection or mass lesion/mass effect. Mild periventricular white matter change likely reflects small vessel ischemic microangiopathy. The posterior fossa, including the cerebellum, brainstem and fourth ventricle, is within normal limits. The third and lateral ventricles, and basal ganglia are unremarkable in appearance. The cerebral hemispheres are symmetric in appearance, with normal gray-white differentiation. No mass effect or midline shift is seen. Vascular: No hyperdense vessel or unexpected calcification. Skull:  There is no evidence of fracture; visualized osseous structures are unremarkable in appearance. Sinuses/Orbits: The orbits are within normal limits. There is partial opacification of the right mastoid air cells. The paranasal sinuses and left mastoid air cells are well-aerated. Other: No significant soft tissue abnormalities are seen. IMPRESSION: 1. No acute intracranial pathology seen on CT. 2. Mild small vessel ischemic microangiopathy. 3. Partial opacification of the right mastoid air cells. Electronically Signed   By: Roanna Raider M.D.   On: 02/27/2016 20:21   Mr Brain Wo Contrast  Result Date: 02/28/2016 CLINICAL DATA:  Initial evaluation for acute speech difficulty, concern for stroke. EXAM: MRI HEAD WITHOUT CONTRAST MRA HEAD WITHOUT CONTRAST TECHNIQUE:  Multiplanar, multiecho pulse sequences of the brain and surrounding structures were obtained without intravenous contrast. Angiographic images of the head were obtained using MRA technique without contrast. COMPARISON:  Prior CT from 1/20 02/27/2016. FINDINGS: MRI HEAD FINDINGS Brain: Cerebral volume within normal limits for age. Minimal T2/FLAIR hyperintensity within the periventricular white matter, most likely related to chronic small vessel ischemic disease, felt to be within normal limits for age as well. No abnormal foci of restricted diffusion to suggest acute or subacute ischemia. Gray-white matter differentiation maintained. No evidence for acute or chronic intracranial hemorrhage. No evidence for chronic infarction. No mass lesion, midline shift, or mass effect. No hydrocephalus. No extra-axial fluid collection. Major dural sinuses are grossly patent. Pituitary gland and suprasellar region within normal limits. Vascular: Major intracranial vascular flow voids are maintained. Skull and upper cervical spine: All craniocervical junction normal. Visualized upper cervical spine unremarkable. Bone marrow signal intensity within normal limits. No scalp soft tissue abnormality. Sinuses/Orbits: Globes and orbital soft tissues within normal limits. Paranasal sinuses are clear. Small bilateral mastoid effusions noted, right greater than left, of doubtful clinical significance. Inner ear structures grossly normal. MRA HEAD FINDINGS ANTERIOR CIRCULATION: Study somewhat degraded by motion artifact. Distal cervical segments of the internal carotid arteries are patent with antegrade flow. Petrous, cavernous, and supraclinoid segments patent without flow-limiting stenosis. A1 segments patent. Anterior communicating artery normal. Anterior cerebral arteries patent to their distal aspects. Irregularity within the ACA is favored to be motion related. M1 segments widely patent without stenosis or occlusion. MCA bifurcations  grossly normal. Distal MCA branches well opacified and symmetric. POSTERIOR CIRCULATION: Vertebral arteries widely patent to the vertebrobasilar junction. Posterior inferior cerebral arteries patent proximally. Basilar artery widely patent. Superior cerebellar and posterior cerebral arteries patent bilaterally. No aneurysm or vascular malformation. IMPRESSION: 1. Normal MRI of the brain. No evidence for acute infarct or other abnormality. 2. Normal intracranial MRA. Electronically Signed   By: Rise Mu M.D.   On: 02/28/2016 04:02   Mr Brain W Contrast  Result Date: 02/29/2016 CLINICAL DATA:  Initial evaluation for possible seizure. EXAM: MRI HEAD WITH CONTRAST TECHNIQUE: Multiplanar, multiecho pulse sequences of the brain and surrounding structures were obtained with intravenous contrast. CONTRAST:  20mL MULTIHANCE GADOBENATE DIMEGLUMINE 529 MG/ML IV SOLN COMPARISON:  Comparison made with prior MRI/MRA from 02/28/2016. Additionally, correlation made with previous EEG. Per report, patient as possible intermixed slowing within the left temporal region. FINDINGS: Brain: Limited study with coronal thin section T2 sequence through the temporal lobes with postcontrast sequences were performed. Thin section imaging through the temporal lobes demonstrates question of very subtle swelling of the left hippocampus as compared to the right (series 4, image 14- 16). Additionally, the normal hippocampal architecture is somewhat blurred and less well defined as compared to the contralateral side. Minimal if  any elevated T2 signal intensity. This could correlate with abnormality questioned on prior EEG. Remainder of the visualized brain unremarkable. Postcontrast imaging demonstrates no abnormal enhancement at the left temporal lobe or elsewhere within the brain. IMPRESSION: 1. Subtle swelling and blurring of normal architecture at the left hippocampus as above lobe, suspected to be related to seizure given the  patient's history and previous EEG findings. 2. No other abnormal enhancement or other abnormality elsewhere within the brain. Electronically Signed   By: Rise MuBenjamin  McClintock M.D.   On: 02/29/2016 05:52   Mr Maxine GlennMra Head/brain MVWo Cm  Result Date: 02/28/2016 CLINICAL DATA:  Initial evaluation for acute speech difficulty, concern for stroke. EXAM: MRI HEAD WITHOUT CONTRAST MRA HEAD WITHOUT CONTRAST TECHNIQUE: Multiplanar, multiecho pulse sequences of the brain and surrounding structures were obtained without intravenous contrast. Angiographic images of the head were obtained using MRA technique without contrast. COMPARISON:  Prior CT from 1/20 02/27/2016. FINDINGS: MRI HEAD FINDINGS Brain: Cerebral volume within normal limits for age. Minimal T2/FLAIR hyperintensity within the periventricular white matter, most likely related to chronic small vessel ischemic disease, felt to be within normal limits for age as well. No abnormal foci of restricted diffusion to suggest acute or subacute ischemia. Gray-white matter differentiation maintained. No evidence for acute or chronic intracranial hemorrhage. No evidence for chronic infarction. No mass lesion, midline shift, or mass effect. No hydrocephalus. No extra-axial fluid collection. Major dural sinuses are grossly patent. Pituitary gland and suprasellar region within normal limits. Vascular: Major intracranial vascular flow voids are maintained. Skull and upper cervical spine: All craniocervical junction normal. Visualized upper cervical spine unremarkable. Bone marrow signal intensity within normal limits. No scalp soft tissue abnormality. Sinuses/Orbits: Globes and orbital soft tissues within normal limits. Paranasal sinuses are clear. Small bilateral mastoid effusions noted, right greater than left, of doubtful clinical significance. Inner ear structures grossly normal. MRA HEAD FINDINGS ANTERIOR CIRCULATION: Study somewhat degraded by motion artifact. Distal cervical  segments of the internal carotid arteries are patent with antegrade flow. Petrous, cavernous, and supraclinoid segments patent without flow-limiting stenosis. A1 segments patent. Anterior communicating artery normal. Anterior cerebral arteries patent to their distal aspects. Irregularity within the ACA is favored to be motion related. M1 segments widely patent without stenosis or occlusion. MCA bifurcations grossly normal. Distal MCA branches well opacified and symmetric. POSTERIOR CIRCULATION: Vertebral arteries widely patent to the vertebrobasilar junction. Posterior inferior cerebral arteries patent proximally. Basilar artery widely patent. Superior cerebellar and posterior cerebral arteries patent bilaterally. No aneurysm or vascular malformation. IMPRESSION: 1. Normal MRI of the brain. No evidence for acute infarct or other abnormality. 2. Normal intracranial MRA. Electronically Signed   By: Rise MuBenjamin  McClintock M.D.   On: 02/28/2016 04:02     Subjective: - no chest pain, shortness of breath, no abdominal pain, nausea or vomiting.   Discharge Exam: Vitals:   02/29/16 0901 02/29/16 1111  BP: (!) 133/56 (!) 149/59  Pulse:  65  Resp:  18  Temp:  98.2 F (36.8 C)   Vitals:   02/28/16 2100 02/29/16 0538 02/29/16 0901 02/29/16 1111  BP: (!) 143/69 119/60 (!) 133/56 (!) 149/59  Pulse: 78 63  65  Resp: 20 20  18   Temp: 98.9 F (37.2 C) 97.8 F (36.6 C)  98.2 F (36.8 C)  TempSrc: Oral Oral  Oral  SpO2: 97% 98%  97%  Weight:      Height:        General: Pt is alert, awake, not in acute distress  Cardiovascular: RRR, S1/S2 +, no rubs, no gallops Respiratory: CTA bilaterally, no wheezing, no rhonchi Abdominal: Soft, NT, ND, bowel sounds + Extremities: no edema, no cyanosis    The results of significant diagnostics from this hospitalization (including imaging, microbiology, ancillary and laboratory) are listed below for reference.     Microbiology: No results found for this or any  previous visit (from the past 240 hour(s)).   Labs: BNP (last 3 results) No results for input(s): BNP in the last 8760 hours. Basic Metabolic Panel:  Recent Labs Lab 02/27/16 1929  NA 140  K 4.3  CL 105  CO2 26  GLUCOSE 92  BUN 18  CREATININE 0.90  CALCIUM 9.7   Liver Function Tests:  Recent Labs Lab 02/27/16 1929  AST 18  ALT 18  ALKPHOS 56  BILITOT 0.6  PROT 6.9  ALBUMIN 4.1   No results for input(s): LIPASE, AMYLASE in the last 168 hours. No results for input(s): AMMONIA in the last 168 hours. CBC:  Recent Labs Lab 02/27/16 1929  WBC 7.8  NEUTROABS 5.5  HGB 14.3  HCT 43.1  MCV 90.0  PLT 239   Cardiac Enzymes: No results for input(s): CKTOTAL, CKMB, CKMBINDEX, TROPONINI in the last 168 hours. BNP: Invalid input(s): POCBNP CBG: No results for input(s): GLUCAP in the last 168 hours. D-Dimer No results for input(s): DDIMER in the last 72 hours. Hgb A1c No results for input(s): HGBA1C in the last 72 hours. Lipid Profile  Recent Labs  02/28/16 0749  CHOL 180  HDL 62  LDLCALC 97  TRIG 105  CHOLHDL 2.9   Thyroid function studies No results for input(s): TSH, T4TOTAL, T3FREE, THYROIDAB in the last 72 hours.  Invalid input(s): FREET3 Anemia work up No results for input(s): VITAMINB12, FOLATE, FERRITIN, TIBC, IRON, RETICCTPCT in the last 72 hours. Urinalysis    Component Value Date/Time   COLORURINE YELLOW 02/27/2016 2150   APPEARANCEUR CLEAR 02/27/2016 2150   LABSPEC 1.015 02/27/2016 2150   PHURINE 5.0 02/27/2016 2150   GLUCOSEU NEGATIVE 02/27/2016 2150   HGBUR NEGATIVE 02/27/2016 2150   BILIRUBINUR NEGATIVE 02/27/2016 2150   KETONESUR 5 (A) 02/27/2016 2150   PROTEINUR NEGATIVE 02/27/2016 2150   UROBILINOGEN 0.2 03/01/2013 0834   NITRITE NEGATIVE 02/27/2016 2150   LEUKOCYTESUR SMALL (A) 02/27/2016 2150   Sepsis Labs Invalid input(s): PROCALCITONIN,  WBC,  LACTICIDVEN Microbiology No results found for this or any previous visit (from  the past 240 hour(s)).   Time coordinating discharge: Over 30 minutes  SIGNED:  Pamella Pert, MD  [facility] 02/29/2016, 1:34 PM Pager [phone]  If 7PM-7AM, please contact night-coverage [email or web address] Password [email or web address]

## 2016-02-29 NOTE — Discharge Instructions (Addendum)
Follow with Simona Huh, MD in 5-7 days  Please get a complete blood count and chemistry panel checked by your Primary MD at your next visit, and again as instructed by your Primary MD. Please get your medications reviewed and adjusted by your Primary MD.  Please request your Primary MD to go over all Hospital Tests and Procedure/Radiological results at the follow up, please get all Hospital records sent to your Prim MD by signing hospital release before you go home.  If you had Pneumonia of Lung problems at the Hospital: Please get a 2 view Chest X ray done in 6-8 weeks after hospital discharge or sooner if instructed by your Primary MD.  If you have Congestive Heart Failure: Please call your Cardiologist or Primary MD anytime you have any of the following symptoms:  1) 3 pound weight gain in 24 hours or 5 pounds in 1 week  2) shortness of breath, with or without a dry hacking cough  3) swelling in the hands, feet or stomach  4) if you have to sleep on extra pillows at night in order to breathe  Follow cardiac low salt diet and 1.5 lit/day fluid restriction.  If you have diabetes Accuchecks 4 times/day, Once in AM empty stomach and then before each meal. Log in all results and show them to your primary doctor at your next visit. If any glucose reading is under 80 or above 300 call your primary MD immediately.  If you have Seizure/Convulsions/Epilepsy: Please do not drive, operate heavy machinery, participate in activities at heights or participate in high speed sports until you have seen by Primary MD or a Neurologist and advised to do so again.  If you had Gastrointestinal Bleeding: Please ask your Primary MD to check a complete blood count within one week of discharge or at your next visit. Your endoscopic/colonoscopic biopsies that are pending at the time of discharge, will also need to followed by your Primary MD.  Get Medicines reviewed and adjusted. Please take all your  medications with you for your next visit with your Primary MD  Please request your Primary MD to go over all hospital tests and procedure/radiological results at the follow up, please ask your Primary MD to get all Hospital records sent to his/her office.  If you experience worsening of your admission symptoms, develop shortness of breath, life threatening emergency, suicidal or homicidal thoughts you must seek medical attention immediately by calling 911 or calling your MD immediately  if symptoms less severe.  You must read complete instructions/literature along with all the possible adverse reactions/side effects for all the Medicines you take and that have been prescribed to you. Take any new Medicines after you have completely understood and accpet all the possible adverse reactions/side effects.   Do not drive or operate heavy machinery when taking Pain medications.   Do not take more than prescribed Pain, Sleep and Anxiety Medications  Special Instructions: If you have smoked or chewed Tobacco  in the last 2 yrs please stop smoking, stop any regular Alcohol  and or any Recreational drug use.  Wear Seat belts while driving.  Please note You were cared for by a hospitalist during your hospital stay. If you have any questions about your discharge medications or the care you received while you were in the hospital after you are discharged, you can call the unit and asked to speak with the hospitalist on call if the hospitalist that took care of you is not available. Once  you are discharged, your primary care physician will handle any further medical issues. Please note that NO REFILLS for any discharge medications will be authorized once you are discharged, as it is imperative that you return to your primary care physician (or establish a relationship with a primary care physician if you do not have one) for your aftercare needs so that they can reassess your need for medications and monitor your  lab values.  You can reach the hospitalist office at phone [phone] or fax [phone]   If you do not have a primary care physician, you can call [phone] for a physician referral.  Activity: As tolerated with Full fall precautions use walker/cane & assistance as needed  Disposition Home    Fat and Cholesterol Restricted Diet Introduction Getting too much fat and cholesterol in your diet may cause health problems. Following this diet helps keep your fat and cholesterol at normal levels. This can keep you from getting sick. What types of fat should I choose?  Choose monosaturated and polyunsaturated fats. These are found in foods such as olive oil, canola oil, flaxseeds, walnuts, almonds, and seeds.  Eat more omega-3 fats. Good choices include salmon, mackerel, sardines, tuna, flaxseed oil, and ground flaxseeds.  Limit saturated fats. These are in animal products such as meats, butter, and cream. They can also be in plant products such as palm oil, palm kernel oil, and coconut oil.  Avoid foods with partially hydrogenated oils in them. These contain trans fats. Examples of foods that have trans fats are stick margarine, some tub margarines, cookies, crackers, and other baked goods. What general guidelines do I need to follow?  Check food labels. Look for the words "trans fat" and "saturated fat."  When preparing a meal:  Fill half of your plate with vegetables and green salads.  Fill one fourth of your plate with whole grains. Look for the word "whole" as the first word in the ingredient list.  Fill one fourth of your plate with lean protein foods.  Eat more foods that have fiber, like apples, carrots, beans, peas, and barley.  Eat more home-cooked foods. Eat less at restaurants and buffets.  Limit or avoid alcohol.  Limit foods high in starch and sugar.  Limit fried foods.  Cook foods without frying them. Baking, boiling, grilling, and broiling are all great  options.  Lose weight if you are overweight. Losing even a small amount of weight can help your overall health. It can also help prevent diseases such as diabetes and heart disease. What foods can I eat? Grains  Whole grains, such as whole wheat or whole grain breads, crackers, cereals, and pasta. Unsweetened oatmeal, bulgur, barley, quinoa, or brown rice. Corn or whole wheat flour tortillas. Vegetables  Fresh or frozen vegetables (raw, steamed, roasted, or grilled). Green salads. Fruits  All fresh, canned (in natural juice), or frozen fruits. Meat and Other Protein Products  Ground beef (85% or leaner), grass-fed beef, or beef trimmed of fat. Skinless chicken or [place]. Ground chicken or [place]. Pork trimmed of fat. All fish and seafood. Eggs. Dried beans, peas, or lentils. Unsalted nuts or seeds. Unsalted canned or dry beans. Dairy  Low-fat dairy products, such as skim or 1% milk, 2% or reduced-fat cheeses, low-fat ricotta or cottage cheese, or plain low-fat yogurt. Fats and Oils  Tub margarines without trans fats. Light or reduced-fat mayonnaise and salad dressings. Avocado. Olive, canola, sesame, or safflower oils. Natural peanut or almond butter (choose ones without added sugar  and oil). The items listed above may not be a complete list of recommended foods or beverages. Contact your dietitian for more options.  What foods are not recommended? Grains  White bread. White pasta. White rice. Cornbread. Bagels, pastries, and croissants. Crackers that contain trans fat. Vegetables  White potatoes. Corn. Creamed or fried vegetables. Vegetables in a cheese sauce. Fruits  Dried fruits. Canned fruit in light or heavy syrup. Fruit juice. Meat and Other Protein Products  Fatty cuts of meat. Ribs, chicken wings, bacon, sausage, bologna, salami, chitterlings, fatback, hot dogs, bratwurst, and packaged luncheon meats. Liver and organ meats. Dairy  Whole or 2% milk, cream, half-and-half, and cream  cheese. Whole milk cheeses. Whole-fat or sweetened yogurt. Full-fat cheeses. Nondairy creamers and whipped toppings. Processed cheese, cheese spreads, or cheese curds. Sweets and Desserts  Corn syrup, sugars, honey, and molasses. Candy. Jam and jelly. Syrup. Sweetened cereals. Cookies, pies, cakes, donuts, muffins, and ice cream. Fats and Oils  Butter, stick margarine, lard, shortening, ghee, or bacon fat. Coconut, palm kernel, or palm oils. Beverages  Alcohol. Sweetened drinks (such as sodas, lemonade, and fruit drinks or punches). The items listed above may not be a complete list of foods and beverages to avoid. Contact your dietitian for more information.  This information is not intended to replace advice given to you by your health care provider. Make sure you discuss any questions you have with your health care provider. Document Released: 07/20/2011 Document Revised: 09/25/2015 Document Reviewed: 04/19/2013  2017 Elsevier   Seizure, Adult A seizure is a sudden burst of abnormal electrical activity in the brain. The abnormal activity temporarily interrupts normal brain function, causing a person to experience any of the following:  Involuntary movements.  Changes in awareness or consciousness.  Uncontrollable shaking (convulsions). Seizures usually last from 30 seconds to 2 minutes. They usually do not cause permanent brain damage unless they are prolonged. What can cause a seizure to happen? Seizures can happen for many reasons including:  A fever.  Low blood sugar.  A medicine.  An illnesses.  A brain injury. Some people who have a seizure never have another one. People who have repeated seizures have a condition called epilepsy. What are the symptoms of a seizure? Symptoms of a seizure vary greatly from person to person. They include:  Convulsions.  Stiffening of the body.  Involuntary movements of the arms or legs.  Loss of consciousness.  Breathing  problems.  Falling suddenly.  Confusion.  Head nodding.  Eye blinking or fluttering.  Lip smacking.  Drooling.  Rapid eye movements.  Grunting.  Loss of bladder control and bowel control.  Staring.  Unresponsiveness. Some people have symptoms right before a seizure happens (aura) and right after a seizure happens. Symptoms of an aura include:  Fear or anxiety.  Nausea.  Feeling like the room is spinning (vertigo).  A feeling of having seen or heard something before (deja vu).  Odd tastes or smells.  Changes in vision, such as seeing flashing lights or spots. Symptoms that may follow a seizure include:  Confusion.  Sleepiness.  Headache.  Weakness of one side of the body. Follow these instructions at home: Medicines  Take over-the-counter and prescription medicines only as told by your health care provider.  Avoid any substances that may prevent your medicine from working properly, such as alcohol. Activity  Do not drive, swim, or do any other activities that would be dangerous if you had another seizure. Wait until your health care  provider approves.  If you live in the [place] check with your local DMV (department of motor vehicles) to find out about the local driving laws. Each state has specific rules about when you can legally return to driving.  Get enough rest. Lack of sleep can make seizures more likely to occur. Educating others Teach friends and family what to do if you have a seizure. They should:  Lay you on the ground to prevent a fall.  Cushion your head and body.  Loosen any tight clothing around your neck.  Turn you on your side. If vomiting occurs, this helps keep your airway clear.  Stay with you until you recover.  Not hold you down. Holding you down will not stop the seizure.  Not put anything in your mouth.  Know whether or not you need emergency care. General instructions  Contact your health care provider each time you  have a seizure.  Avoid anything that has ever triggered a seizure for you.  Keep a seizure diary. Record what you remember about each seizure, especially anything that might have triggered the seizure.  Keep all follow-up visits as told by your health care provider. This is important. Contact a health care provider if:  You have another seizure.  You have seizures more often.  Your seizure symptoms change.  You continue to have seizures with treatment.  You have symptoms of an infection or illness. They might increase your risk of having a seizure. Get help right away if:  You have a seizure:  That lasts longer than 5 minutes.  That is different than previous seizures.  That leaves you unable to speak or use a part of your body.  That makes it harder to breathe.  After a head injury.  You have:  Multiple seizures in a row.  Confusion or a severe headache right after a seizure.  You are having seizures more often.  You do not wake up immediately after a seizure.  You injure yourself during a seizure. These symptoms may represent a serious problem that is an emergency. Do not wait to see if the symptoms will go away. Get medical help right away. Call your local emergency services (911 in the [place] Do not drive yourself to the hospital.  This information is not intended to replace advice given to you by your health care provider. Make sure you discuss any questions you have with your health care provider. Document Released: 01/16/2000 Document Revised: 09/14/2015 Document Reviewed: 08/22/2015 Elsevier Interactive Patient Education  2017 [employer]  Levetiracetam tablets What is this medicine? LEVETIRACETAM (lee ve tye RA se tam) is an antiepileptic drug. It is used with other medicines to treat certain types of seizures. This medicine may be used for other purposes; ask your health care provider or pharmacist if you have questions. COMMON BRAND NAME(S): Keppra,  Roweepra What should I tell my health care provider before I take this medicine? They need to know if you have any of these conditions: -kidney disease -suicidal thoughts, plans, or attempt; a previous suicide attempt by you or a family member -an unusual or allergic reaction to levetiracetam, other medicines, foods, dyes, or preservatives -pregnant or trying to get pregnant -breast-feeding How should I use this medicine? Take this medicine by mouth with a glass of water. Follow the directions on the prescription label. Swallow the tablets whole. Do not crush or chew this medicine. You may take this medicine with or without food. Take your doses at regular  intervals. Do not take your medicine more often than directed. Do not stop taking this medicine or any of your seizure medicines unless instructed by your doctor or health care professional. Stopping your medicine suddenly can increase your seizures or their severity. A special MedGuide will be given to you by the pharmacist with each prescription and refill. Be sure to read this information carefully each time. Contact your pediatrician or health care professional regarding the use of this medication in children. While this drug may be prescribed for children as young as 92 years of age for selected conditions, precautions do apply. Overdosage: If you think you have taken too much of this medicine contact a poison control center or emergency room at once. NOTE: This medicine is only for you. Do not share this medicine with others. What if I miss a dose? If you miss a dose, take it as soon as you can. If it is almost time for your next dose, take only that dose. Do not take double or extra doses. What may interact with this medicine? This medicine may interact with the following medications: -carbamazepine -colesevelam -probenecid -sevelamer This list may not describe all possible interactions. Give your health care provider a list of all the  medicines, herbs, non-prescription drugs, or dietary supplements you use. Also tell them if you smoke, drink alcohol, or use illegal drugs. Some items may interact with your medicine. What should I watch for while using this medicine? Visit your doctor or health care professional for a regular check on your progress. Wear a medical identification bracelet or chain to say you have epilepsy, and carry a card that lists all your medications. It is important to take this medicine exactly as instructed by your health care professional. When first starting treatment, your dose may need to be adjusted. It may take weeks or months before your dose is stable. You should contact your doctor or health care professional if your seizures get worse or if you have any new types of seizures. You may get drowsy or dizzy. Do not drive, use machinery, or do anything that needs mental alertness until you know how this medicine affects you. Do not stand or sit up quickly, especially if you are an older patient. This reduces the risk of dizzy or fainting spells. Alcohol may interfere with the effect of this medicine. Avoid alcoholic drinks. The use of this medicine may increase the chance of suicidal thoughts or actions. Pay special attention to how you are responding while on this medicine. Any worsening of mood, or thoughts of suicide or dying should be reported to your health care professional right away. Women who become pregnant while using this medicine may enroll in the [place] Antiepileptic Drug Pregnancy Registry by calling [phone]. This registry collects information about the safety of antiepileptic drug use during pregnancy. What side effects may I notice from receiving this medicine? Side effects that you should report to your doctor or health care professional as soon as possible: -allergic reactions like skin rash, itching or hives, swelling of the face, lips, or tongue -breathing problems -dark  urine -general ill feeling or flu-like symptoms -problems with balance, talking, walking -unusually weak or tired -worsening of mood, thoughts or actions of suicide or dying -yellowing of the eyes or skin Side effects that usually do not require medical attention (report to your doctor or health care professional if they continue or are bothersome): -diarrhea -dizzy, drowsy -headache -loss of appetite This list may not describe  all possible side effects. Call your doctor for medical advice about side effects. You may report side effects to FDA at 1-800-FDA-1088. Where should I keep my medicine? Keep out of reach of children. Store at room temperature between 15 and 30 degrees C (59 and 86 degrees F). Throw away any unused medicine after the expiration date. NOTE: This sheet is a summary. It may not cover all possible information. If you have questions about this medicine, talk to your doctor, pharmacist, or health care provider.  2017 Elsevier/Gold Standard (2015-02-20 09:43:54)

## 2016-02-29 NOTE — Progress Notes (Signed)
STROKE TEAM PROGRESS NOTE   SUBJECTIVE (INTERVAL HISTORY) Her husband is at the bedside.  No acute event overnight, no seizure. EEG reported subtle left temporal slowing. MRI with contrast negative but evidence of seizure.   OBJECTIVE Temp:  [97.8 F (36.6 C)-98.9 F (37.2 C)] 98.2 F (36.8 C) (01/28 1111) Pulse Rate:  [63-78] 65 (01/28 1111) Cardiac Rhythm: Normal sinus rhythm (01/28 0700) Resp:  [18-20] 18 (01/28 1111) BP: (115-149)/(48-69) 149/59 (01/28 1111) SpO2:  [95 %-98 %] 97 % (01/28 1111)  CBC:   Recent Labs Lab 02/27/16 1929  WBC 7.8  NEUTROABS 5.5  HGB 14.3  HCT 43.1  MCV 90.0  PLT 239    Basic Metabolic Panel:   Recent Labs Lab 02/27/16 1929  NA 140  K 4.3  CL 105  CO2 26  GLUCOSE 92  BUN 18  CREATININE 0.90  CALCIUM 9.7    Lipid Panel:     Component Value Date/Time   CHOL 180 02/28/2016 0749   TRIG 105 02/28/2016 0749   HDL 62 02/28/2016 0749   CHOLHDL 2.9 02/28/2016 0749   VLDL 21 02/28/2016 0749   LDLCALC 97 02/28/2016 0749   HgbA1c: No results found for: HGBA1C Urine Drug Screen:     Component Value Date/Time   LABOPIA NONE DETECTED 02/28/2016 0643   COCAINSCRNUR NONE DETECTED 02/28/2016 0643   LABBENZ POSITIVE (A) 02/28/2016 0643   AMPHETMU NONE DETECTED 02/28/2016 0643   THCU NONE DETECTED 02/28/2016 0643   LABBARB NONE DETECTED 02/28/2016 0643      IMAGING I have personally reviewed the radiological images below and agree with the radiology interpretations.  Dg Chest 2 View 02/27/2016 No acute findings.  Ct Head Wo Contrast 02/27/2016 1. No acute intracranial pathology seen on CT.  2. Mild small vessel ischemic microangiopathy.  3. Partial opacification of the right mastoid air cells.   Mr Carol Gamble Head/brain Wo Cm 02/28/2016 1. Normal MRI of the brain. No evidence for acute infarct or other abnormality.  2. Normal intracranial MRA.   EEG 02/28/2016 Impression:  This is a mildly abnormal EEG due to the presence of a  mild degree of intermixed slowing in the left temporal region. No epileptiform activity is seen. Clinical correlation: This EEG suggests the possibility of mild cortical dysfunction in the left temporal region. This could be consistent with the patient's presenting complaints of word finding difficulty and inability to express her thoughts. Slowing such as seen on this recording is somewhat nonspecific but can be seen with regional structural pathology such as ischemic insult, tumor, or encephalitis. The absence of epileptiform abnormalities does not exclude the possibility of seizure. Clinical correlation is required.  MRI brain with contrast  02/29/2016 1. Subtle swelling and blurring of normal architecture at the left hippocampus as above lobe,  suspected to be related to seizure given the patient's history and previous EEG findings. 2. No other abnormal enhancement or other abnormality elsewhere within the brain.  CUS - 02/28/2016 - 1-39% ICA plaquing. Vertebral artery flow is antegrade.   TTE - Left ventricle: The cavity size was normal. There was moderate   concentric hypertrophy. Systolic function was normal. The   estimated ejection fraction was in the range of 55% to 60%. Wall   motion was normal; there were no regional wall motion   abnormalities. Doppler parameters are consistent with abnormal   left ventricular relaxation (grade 1 diastolic dysfunction). - Mitral valve: There was mild regurgitation. - Left atrium: The atrium was mildly dilated.  Impressions: - No cardiac source of embolism was identified.   PHYSICAL EXAM  Temp:  [97.8 F (36.6 C)-98.9 F (37.2 C)] 98.2 F (36.8 C) (01/28 1111) Pulse Rate:  [63-78] 65 (01/28 1111) Resp:  [18-20] 18 (01/28 1111) BP: (115-149)/(48-69) 149/59 (01/28 1111) SpO2:  [95 %-98 %] 97 % (01/28 1111)  General - Well nourished, well developed, in no apparent distress.  Ophthalmologic - Sharp disc margins OU.   Cardiovascular -  Regular rate and rhythm.  Mental Status -  Level of arousal and orientation to time, place, and person were intact. Language including expression, naming, repetition, comprehension was assessed and found intact. Attention span and concentration were normal. Fund of Knowledge was assessed and was intact.  Cranial Nerves II - XII - II - Visual field intact OU III, IV, VI - Extraocular movements intact. V - Facial sensation intact bilaterally. VII - Facial movement intact bilaterally. VIII - Hearing & vestibular intact bilaterally. X - Palate elevates symmetrically. XI - Chin turning & shoulder shrug intact bilaterally. XII - Tongue protrusion intact.  Motor Strength - The patient's strength was normal in all extremities and pronator drift was absent.  Bulk was normal and fasciculations were absent.   Motor Tone - Muscle tone was assessed at the neck and appendages and was normal.  Reflexes - The patient's reflexes were 1+ in all extremities and she had no pathological reflexes.  Sensory - Light touch, temperature/pinprick, vibration and proprioception, and Romberg testing were assessed and were symmetrical.    Coordination - The patient had normal movements in the hands and feet with no ataxia or dysmetria.  Tremor was absent.  Gait and Station - deferred due to EEG.   ASSESSMENT/PLAN [name] is a 76 y.o. female with history of hypertension, asthma, palpitations, renal calculi, headaches, and arthritis presenting with speech difficulties, staring / unresponsivemild confusion, and post ictal confusion with right hemianopia episodes. She did not receive IV t-PA due to improving deficits.  Seizures - with staring spells and postictal confusion  Resultant - resolution of deficits.  MRI without contrast- normal  MRA - normal  MRI with contrast - Subtle swelling and blurring of left hippocampus suspected to be related to seizure.  EEG -  No seizure. Mild cortical  dysfunction in the left temporal region  Carotid Doppler - 1-39% ICA plaquing. Vertebral artery flow is antegrade.   2D Echo - EF 55-60%  LDL - 97  HgbA1c 5.6  VTE prophylaxis - SCDs Diet Heart Room service appropriate? Yes; Fluid consistency: Thin  No antithrombotic prior to admission, now on aspirin 325 mg daily. Continue ASA on discharge.   Due to mild EEG abnormality, patient desire to be put on AED. Will put on Keppra 500 twice a day.  Patient was educated on seizure precautions. And she is aware she cannot drive until seizure free for 6 months.  Patient counseled to be compliant with her antithrombotic medications  Ongoing aggressive stroke risk factor management  Therapy recommendations: None.  Disposition:  Home  Hypertension  Blood pressure mildly low at times  Home med - HCTZ and lisinopril - resumed  Long-term BP goal normotensive  Hyperlipidemia  Home meds: Zocor 40 mg daily resumed in hospital  LDL 97, goal < 100  Continue statin at discharge  Other Active Problems  Advanced age  Second hand tobacco smoke exposure 30 years  Obesity, Body mass index is 36.98 kg/m., recommend weight loss, diet and exercise as appropriate  Hospital day # 0  Neurology will sign off. Please call with questions. Pt [facility] weeks. Thanks for the consult.   Marvel PlanJindong Hanif Radin, MD PhD Stroke Neurology 02/29/2016 12:05 PM   To contact Stroke Continuity provider, please refer to [email or web address]. After hours, contact General Neurology

## 2016-03-02 LAB — VAS US CAROTID
LCCADDIAS: 20 cm/s
LEFT ECA DIAS: -2 cm/s
LEFT VERTEBRAL DIAS: 11 cm/s
LICADDIAS: -23 cm/s
LICADSYS: -88 cm/s
LICAPDIAS: -17 cm/s
LICAPSYS: -56 cm/s
Left CCA dist sys: 86 cm/s
Left CCA prox dias: 24 cm/s
Left CCA prox sys: 85 cm/s
RIGHT ECA DIAS: -9 cm/s
RIGHT VERTEBRAL DIAS: -12 cm/s
Right CCA prox dias: 18 cm/s
Right CCA prox sys: 96 cm/s
Right cca dist sys: -100 cm/s

## 2016-04-20 ENCOUNTER — Telehealth: Payer: Self-pay | Admitting: Neurology

## 2016-04-20 ENCOUNTER — Other Ambulatory Visit: Payer: Self-pay

## 2016-04-20 MED ORDER — LEVETIRACETAM 500 MG PO TABS: 500.0000 mg | ORAL_TABLET | Freq: Two times a day (BID) | ORAL | 0 refills | Status: DC

## 2016-04-20 NOTE — Telephone Encounter (Signed)
If patient calls back she needs to come to appt on 05/03/2016 to continue refills. Pt has appt with NP for hospital follow up. Refill done until appt.

## 2016-04-20 NOTE — Telephone Encounter (Signed)
Rx of keppra also fax to pharmacy at [phone] [facility] on in [place] [place].

## 2016-04-20 NOTE — Telephone Encounter (Signed)
Pt request refill for levETIRAcetam (KEPPRA) 500 MG tablet sent to [facility]. Pt has a few days left

## 2016-04-30 ENCOUNTER — Other Ambulatory Visit: Payer: Self-pay | Admitting: Family Medicine

## 2016-04-30 DIAGNOSIS — Z1231 Encounter for screening mammogram for malignant neoplasm of breast: Secondary | ICD-10-CM

## 2016-05-03 ENCOUNTER — Encounter (INDEPENDENT_AMBULATORY_CARE_PROVIDER_SITE_OTHER): Payer: Self-pay

## 2016-05-03 ENCOUNTER — Ambulatory Visit (INDEPENDENT_AMBULATORY_CARE_PROVIDER_SITE_OTHER): Payer: Medicare Other | Admitting: Nurse Practitioner

## 2016-05-03 ENCOUNTER — Encounter: Payer: Self-pay | Admitting: Nurse Practitioner

## 2016-05-03 VITALS — BP 126/75 | HR 57 | Ht 62.0 in | Wt 212.8 lb

## 2016-05-03 DIAGNOSIS — R569 Unspecified convulsions: Secondary | ICD-10-CM

## 2016-05-03 DIAGNOSIS — E785 Hyperlipidemia, unspecified: Secondary | ICD-10-CM

## 2016-05-03 DIAGNOSIS — I10 Essential (primary) hypertension: Secondary | ICD-10-CM | POA: Diagnosis not present

## 2016-05-03 DIAGNOSIS — R2990 Unspecified symptoms and signs involving the nervous system: Secondary | ICD-10-CM

## 2016-05-03 MED ORDER — LEVETIRACETAM 500 MG PO TABS: 500.0000 mg | ORAL_TABLET | Freq: Two times a day (BID) | ORAL | 3 refills | Status: DC

## 2016-05-03 NOTE — Patient Instructions (Addendum)
Continue Keppra  twice daily will reorder Please remember, common seizure triggers are: Sleep deprivation, dehydration, overheating, stress, hypoglycemia or skipping meals, certain medications or excessive alcohol use, especially stopping alcohol abruptly if you have had heavy alcohol use before (aka alcohol withdrawal seizure). If you have a prolonged seizure over 2-5 minutes or back to back seizures, call or have someone call 911 or take you to the nearest emergency room. You cannot drive a car or operate any other machinery or vehicle within 6 months of a seizure. Please do not swim alone or take a tub bath for safety. Do not cook with large quantities of boiling water or oil for safety. Take your medicine for seizure prevention regularly and do not skip doses or stop medication abruptly and tone are told to do so by your healthcare provider. Try to get a refill on your antiepileptic medication ahead of time, so you are not at risk of running out. If you run out of the seizure medication and do not have a refill at hand she may run into medication withdrawal seizures. Avoid taking Wellbutrin, narcotic pain medications and tramadol, as they can lower seizure threshold.   Seizure, Adult A seizure is a sudden burst of abnormal electrical activity in the brain. The abnormal activity temporarily interrupts normal brain function, causing a person to experience any of the following:  Involuntary movements.  Changes in awareness or consciousness.  Uncontrollable shaking (convulsions). Seizures usually last from 30 seconds to 2 minutes. They usually do not cause permanent brain damage unless they are prolonged. What can cause a seizure to happen?  Seizures can happen for many reasons including:  A fever.  Low blood sugar.  A medicine.  An illnesses.  A brain injury. Some people who have a seizure never have another one. People who have repeated seizures have a condition called epilepsy. What  are the symptoms of a seizure?  Symptoms of a seizure vary greatly from person to person. They include:  Convulsions.  Stiffening of the body.  Involuntary movements of the arms or legs.  Loss of consciousness.  Breathing problems.  Falling suddenly.  Confusion.  Head nodding.  Eye blinking or fluttering.  Lip smacking.  Drooling.  Rapid eye movements.  Grunting.  Loss of bladder control and bowel control.  Staring.  Unresponsiveness. Some people have symptoms right before a seizure happens (aura) and right after a seizure happens. Symptoms of an aura include:  Fear or anxiety.  Nausea.  Feeling like the room is spinning (vertigo).  A feeling of having seen or heard something before (deja vu).  Odd tastes or smells.  Changes in vision, such as seeing flashing lights or spots. Symptoms that may follow a seizure include:  Confusion.  Sleepiness.  Headache.  Weakness of one side of the body. Follow these instructions at home: Medicines    Take over-the-counter and prescription medicines only as told by your health care provider.  Avoid any substances that may prevent your medicine from working properly, such as alcohol. Activity   Do not drive, swim, or do any other activities that would be dangerous if you had another seizure. Wait until your health care provider approves.  If you live in the [place] check with your local DMV (department of motor vehicles) to find out about the local driving laws. Each state has specific rules about when you can legally return to driving.  Get enough rest. Lack of sleep can make seizures more likely to occur. Educating  others  Teach friends and family what to do if you have a seizure. They should:  Lay you on the ground to prevent a fall.  Cushion your head and body.  Loosen any tight clothing around your neck.  Turn you on your side. If vomiting occurs, this helps keep your airway clear.  Stay with you  until you recover.  Not hold you down. Holding you down will not stop the seizure.  Not put anything in your mouth.  Know whether or not you need emergency care. General instructions   Contact your health care provider each time you have a seizure.  Avoid anything that has ever triggered a seizure for you.  Keep a seizure diary. Record what you remember about each seizure, especially anything that might have triggered the seizure.  Keep all follow-up visits as told by your health care provider. This is important. Contact a health care provider if:  You have another seizure.  You have seizures more often.  Your seizure symptoms change.  You continue to have seizures with treatment.  You have symptoms of an infection or illness. They might increase your risk of having a seizure. Get help right away if:  You have a seizure:  That lasts longer than 5 minutes.  That is different than previous seizures.  That leaves you unable to speak or use a part of your body.  That makes it harder to breathe.  After a head injury.  You have:  Multiple seizures in a row.  Confusion or a severe headache right after a seizure.  You are having seizures more often.  You do not wake up immediately after a seizure.  You injure yourself during a seizure. These symptoms may represent a serious problem that is an emergency. Do not wait to see if the symptoms will go away. Get medical help right away. Call your local emergency services (911 in the [place] Do not drive yourself to the hospital. This information is not intended to replace advice given to you by your health care provider. Make sure you discuss any questions you have with your health care provider. Document Released: 01/16/2000 Document Revised: 09/14/2015 Document Reviewed: 08/22/2015 Elsevier Interactive Patient Education  2017 [employer].

## 2016-05-03 NOTE — Progress Notes (Signed)
I have read the note, and I agree with the clinical assessment and plan.  Tylique Aull KEITH   

## 2016-05-03 NOTE — Progress Notes (Signed)
[facility]  PATIENT: Carol Gamble DOB: December 18, 1940   REASON FOR VISIT: Hospital follow-up for admission for word finding difficulty inability to express thoughts HISTORY FROM: Patient and husband Carol Gamble    HISTORY OF PRESENT ILLNESS: [name], 76 year old female with history of hypertension and asthma palpitations renal calculi headaches arthritis presenting to the hospital with speech difficulties staring episodes and unresponsiveness and postictal confusion with right hemianopsia episodes. MRI normal MRA normal MRI with contrast subtle swelling and blurring of left-campus suspected to be related to seizure. EEG mild cortical dysfunction in the left temporal region. Carotid Doppler 1-39% ICA plaquing 2-D echo EF 55-60%. LDL 97. Hemoglobin A1c 5.6. Patient was placed on Keppra 500 twice a day. She returns to the office today for hospital follow-up. She has not had further episodes of seizure activity. She is currently on Keppra 500 twice daily without side effects. She is also on aspirin 0.81 for secondary stroke prevention. She is on Zocor for hyperlipidemia without complaints of myalgias. Blood pressure in the office today 126/75. She returns for reevaluation   REVIEW OF SYSTEMS: Full 14 system review of systems performed and notable only for those listed, all others are neg:  Constitutional: Fatigue  Cardiovascular: neg Ear/Nose/Throat: Hearing loss  Skin: neg Eyes: neg Respiratory: neg Gastroitestinal: neg  Hematology/Lymphatic: neg  Endocrine: neg Musculoskeletal:neg Allergy/Immunology: neg Neurological: neg Psychiatric: neg Sleep : neg   ALLERGIES: Allergies  Allergen Reactions  . Shrimp [Shellfish Allergy] Hives    HOME MEDICATIONS: Outpatient Medications Prior to Visit  Medication Sig Dispense Refill  . aspirin EC 81 MG EC tablet Take 1 tablet (81 mg total) by mouth daily. 30 tablet 1  . atenolol (TENORMIN) 50 MG tablet Take 50 mg by mouth  daily with breakfast.    . CALCIUM-VITAMIN D PO Take 1 tablet by mouth 2 (two) times daily.    . diphenhydramine-acetaminophen (TYLENOL PM) 25-500 MG TABS tablet Take 1 tablet by mouth at bedtime as needed.    . fluticasone (FLONASE) 50 MCG/ACT nasal spray Place 2 sprays into both nostrils daily as needed for allergies or rhinitis.     Marland Kitchen HYDROcodone-acetaminophen (NORCO) 5-325 MG per tablet Take 1 tablet by mouth every 6 (six) hours as needed for moderate pain. (Patient not taking: Reported on 02/27/2016) 30 tablet 0  . hydroxypropyl methylcellulose (ISOPTO TEARS) 2.5 % ophthalmic solution Place 2 drops into both eyes 3 (three) times daily as needed for dry eyes.     Marland Kitchen ibuprofen (ADVIL,MOTRIN) 200 MG tablet Take 400 mg by mouth daily as needed for mild pain.    Marland Kitchen levETIRAcetam (KEPPRA) 500 MG tablet Take 1 tablet (500 mg total) by mouth 2 (two) times daily. 60 tablet 0  . lisinopril-hydrochlorothiazide (PRINZIDE,ZESTORETIC) 20-12.5 MG per tablet Take 1 tablet by mouth daily with breakfast.    . Multiple Vitamin (MULTIVITAMIN WITH MINERALS) TABS Take 1 tablet by mouth daily.    . naproxen sodium (ANAPROX) 220 MG tablet Take 440 mg by mouth daily as needed (back pain).    . Omega-3 Fatty Acids (FISH OIL) 1200 MG CPDR Take 1 capsule by mouth 2 (two) times daily.    Marland Kitchen omeprazole (PRILOSEC) 20 MG capsule Take 20 mg by mouth daily.    Marland Kitchen oxyCODONE-acetaminophen (PERCOCET) 5-325 MG per tablet Take 2 tablets by mouth every 4 (four) hours as needed. (Patient not taking: Reported on 02/27/2016) 25 tablet 0  . simvastatin (ZOCOR) 40 MG tablet Take 40 mg by mouth at bedtime.  No facility-administered medications prior to visit.     PAST MEDICAL HISTORY: Past Medical History:  Diagnosis Date  . Arthritis    HNP- low back   . Complication of anesthesia    pt. reports that when she wakes up she had a hard time catching her breath & the PACU team told her that her BP & pulse were not doing what it was  suppose to do  . Headache(784.0)   . History of asthma    childhood, age 58  . History of kidney stones about 2008ish  . Hypertension    states she always has palpitations   . PONV (postoperative nausea and vomiting)   . Shortness of breath    with exertion    PAST SURGICAL HISTORY: Past Surgical History:  Procedure Laterality Date  . ABDOMINAL HYSTERECTOMY     1993  . BACK SURGERY     2000,01,10  . CYSTOSCOPY/RETROGRADE/URETEROSCOPY/STONE EXTRACTION WITH BASKET Left 03/01/2013   Procedure: CYSTOSCOPY/RETROGRADE/URETEROSCOPY/STONE EXTRACTION WITH BASKET/INSERTION LEFT DOUBLE  J STENT;  Surgeon: Su Grand, MD;  Location: [facility];  Service: Urology;  Laterality: Left;  . FINGER MASS EXCISION  2008  . FOOT TENDON SURGERY  1985  . HOLMIUM LASER APPLICATION Left 03/01/2013   Procedure: HOLMIUM LASER APPLICATION;  Surgeon: Su Grand, MD;  Location: [facility];  Service: Urology;  Laterality: Left;  . INNER EAR SURGERY     x2  . ROTATOR CUFF REPAIR     both shoulders done, last one - 2010  . TONSILLECTOMY      FAMILY HISTORY: No family history on file.  SOCIAL HISTORY: Social History   Social History  . Marital status: Married    Spouse name: N/A  . Number of children: N/A  . Years of education: N/A   Occupational History  . Not on file.   Social History Main Topics  . Smoking status: Passive Smoke Exposure - Never Smoker    Years: 30.00    Types: Cigarettes  . Smokeless tobacco: Never Used  . Alcohol use No  . Drug use: No  . Sexual activity: Not on file   Other Topics Concern  . Not on file   Social History Narrative  . No narrative on file     PHYSICAL EXAM  Vitals:   05/03/16 1246  Weight: 212 lb 12.8 oz (96.5 kg)  Height:  (1.575 m)   Body mass index is 38.92 kg/m.  Generalized: Well developed, Obese female in no acute distress  Head: normocephalic and atraumatic,. Oropharynx benign  Neck: Supple, no carotid bruits  Cardiac: Regular rate rhythm,  no murmur  Musculoskeletal: No deformity   Neurological examination   Mentation: Alert oriented to time, place, history taking. Attention span and concentration appropriate. Recent and remote memory intact.  Follows all commands speech and language fluent.   Cranial nerve II-XII: .Pupils were equal round reactive to light extraocular movements were full, visual field were full on confrontational test. Facial sensation and strength were normal. hearing was intact to finger rubbing bilaterally. Uvula tongue midline. head turning and shoulder shrug were normal and symmetric.Tongue protrusion into cheek strength was normal. Motor: normal bulk and tone, full strength in the BUE, BLE, fine finger movements normal, no pronator drift. No focal weakness Sensory: normal and symmetric to light touch, pinprick, and  Vibration, in the upper and lower extremities  Coordination: finger-nose-finger, heel-to-shin bilaterally, no dysmetria Reflexes: 1+ upper lower and symmetric, plantar responses were flexor bilaterally. Gait and Station:  Rising up from seated position without assistance, normal stance,  moderate stride, good arm swing, smooth turning, able to perform tiptoe, and heel walking without difficulty. Tandem gait is steady. No assistive device  DIAGNOSTIC DATA (LABS, IMAGING, TESTING) - I reviewed patient records, labs, notes, testing and imaging myself where available.  Lab Results  Component Value Date   WBC 7.8 02/27/2016   HGB 14.3 02/27/2016   HCT 43.1 02/27/2016   MCV 90.0 02/27/2016   PLT 239 02/27/2016      Component Value Date/Time   NA 140 02/27/2016 1929   K 4.3 02/27/2016 1929   CL 105 02/27/2016 1929   CO2 26 02/27/2016 1929   GLUCOSE 92 02/27/2016 1929   BUN 18 02/27/2016 1929   CREATININE 0.90 02/27/2016 1929   CALCIUM 9.7 02/27/2016 1929   PROT 6.9 02/27/2016 1929   ALBUMIN 4.1 02/27/2016 1929   AST 18 02/27/2016 1929   ALT 18 02/27/2016 1929   ALKPHOS 56 02/27/2016  1929   BILITOT 0.6 02/27/2016 1929   GFRNONAA >60 02/27/2016 1929   GFRAA >60 02/27/2016 1929   Lab Results  Component Value Date   CHOL 180 02/28/2016   HDL 62 02/28/2016   LDLCALC 97 02/28/2016   TRIG 105 02/28/2016   CHOLHDL 2.9 02/28/2016   Lab Results  Component Value Date   HGBA1C 5.6 02/28/2016    ASSESSMENT AND PLAN  76 y.o. year old female  has a past medical history of history of hypertension and asthma palpitations renal calculi headaches arthritis presenting to the hospital with speech difficulties staring episodes and unresponsiveness and postictal confusion with right hemianopsia episodes. MRI normal MRA normal MRI with contrast subtle swelling and blurring of left-campus suspected to be related to seizure. EEG mild cortical dysfunction in the left temporal region. Carotid Doppler 1-39% ICA plaquing 2-D echo EF 55-60%. LDL 97. Hemoglobin A1c 5.6.  The patient is a current patient of Dr. Roda Shutters  who is out of the office today . This note is sent to the work in doctor.     Continue Keppra  twice daily will reorder Continue aspirin for secondary stroke prevention Continue Zocor for hyperlipidemia Please remember, common seizure triggers are: Sleep deprivation, dehydration, overheating, stress, hypoglycemia or skipping meals, certain medications or excessive alcohol use, especially stopping alcohol abruptly if you have had heavy alcohol use before (aka alcohol withdrawal seizure). If you have a prolonged seizure over 2-5 minutes or back to back seizures, call or have someone call 911 or take you to the nearest emergency room. You cannot drive a car or operate any other machinery or vehicle within 6 months of a seizure. Please do not swim alone or take a tub bath for safety. Do not cook with large quantities of boiling water or oil for safety. Take your medicine for seizure prevention regularly and do not skip doses or stop medication abruptly and tone are told to do so by your  healthcare provider. Try to get a refill on your antiepileptic medication ahead of time, so you are not at risk of running out. If you run out of the seizure medication and do not have a refill at hand she may run into medication withdrawal seizures. Avoid taking Wellbutrin, narcotic pain medications and tramadol, as they can lower seizure threshold.  I spent 25 minutes in total face to face time with the patient more than 50% of which was spent counseling and coordination of care, reviewing test results reviewing medications and discussing  and reviewing the diagnosis of seizure disorder and precautions and further treatment options. , Cline Crock, [facility], APRN  [facility] [address] [place], [place] [address] [phone]

## 2016-05-31 ENCOUNTER — Ambulatory Visit
Admission: RE | Admit: 2016-05-31 | Discharge: 2016-05-31 | Disposition: A | Discharge status: Home / Self Care | Payer: Medicare Other | Source: Ambulatory Visit | Attending: Family Medicine | Admitting: Family Medicine

## 2016-05-31 DIAGNOSIS — Z1231 Encounter for screening mammogram for malignant neoplasm of breast: Secondary | ICD-10-CM

## 2016-09-01 NOTE — Progress Notes (Signed)
[facility]  PATIENT: Carol Gamble DOB: [birth year]   REASON FOR VISIT: follow-up for admission for word finding difficulty inability to express thoughts  Seizure disorder HISTORY FROM: Patient     HISTORY OF PRESENT ILLNESS: UPDATE  08/02/2018CM  Carol Gamble,  76 year-old female returns for follow-up seizure disorder. She had episode of word finding difficulty, and inability to express her thoughts. MRI and MRA were normal. EEG mild cortical dysfunction, left temporal region. She is currently on Keppra 500 twice daily without side effects and further seizure activity. She remains on aspirin for secondary stroke prevention and Zocor for hyperlipidemia. She returns for reevaluation HISTORY 05/03/16 [name], 76 year old female with history of hypertension and asthma palpitations renal calculi headaches arthritis presenting to the hospital with speech difficulties staring episodes and unresponsiveness and postictal confusion with right hemianopsia episodes. MRI normal MRA normal MRI with contrast subtle swelling and blurring of left-campus suspected to be related to seizure. EEG mild cortical dysfunction in the left temporal region. Carotid Doppler 1-39% ICA plaquing 2-D echo EF 55-60%. LDL 97. Hemoglobin A1c 5.6. Patient was placed on Keppra 500 twice a day. She returns to the office today for hospital follow-up. She has not had further episodes of seizure activity. She is currently on Keppra 500 twice daily without side effects. She is also on aspirin 0.81 for secondary stroke prevention. She is on Zocor for hyperlipidemia without complaints of myalgias. Blood pressure in the office today 126/75. She returns for reevaluation   REVIEW OF SYSTEMS: Full 14 system review of systems performed and notable only for those listed, all others are neg:  Constitutional: Fatigue  Cardiovascular: neg Ear/Nose/Throat: Hearing loss  Skin: neg Eyes: neg Respiratory:  neg Gastroitestinal: neg  Hematology/Lymphatic: neg  Endocrine: neg Musculoskeletal:neg Allergy/Immunology: neg Neurological: neg Psychiatric: neg Sleep : neg   ALLERGIES: Allergies  Allergen Reactions  . Shrimp [Shellfish Allergy] Hives    HOME MEDICATIONS: Outpatient Medications Prior to Visit  Medication Sig Dispense Refill  . aspirin EC 81 MG EC tablet Take 1 tablet (81 mg total) by mouth daily. 30 tablet 1  . atenolol (TENORMIN) 50 MG tablet Take 50 mg by mouth daily with breakfast.    . CALCIUM-VITAMIN D PO Take 1 tablet by mouth 2 (two) times daily.    . diphenhydramine-acetaminophen (TYLENOL PM) 25-500 MG TABS tablet Take 1 tablet by mouth at bedtime as needed.    . fluticasone (FLONASE) 50 MCG/ACT nasal spray Place 2 sprays into both nostrils daily as needed for allergies or rhinitis.     . hydroxypropyl methylcellulose (ISOPTO TEARS) 2.5 % ophthalmic solution Place 2 drops into both eyes 3 (three) times daily as needed for dry eyes.     Marland Kitchen. ibuprofen (ADVIL,MOTRIN) 200 MG tablet Take 400 mg by mouth daily as needed for mild pain.    Marland Kitchen. levETIRAcetam (KEPPRA) 500 MG tablet Take 1 tablet (500 mg total) by mouth 2 (two) times daily. 180 tablet 3  . lisinopril-hydrochlorothiazide (PRINZIDE,ZESTORETIC) 20-12.5 MG per tablet Take 1 tablet by mouth daily with breakfast.    . Multiple Vitamin (MULTIVITAMIN WITH MINERALS) TABS Take 1 tablet by mouth daily.    . naproxen sodium (ANAPROX) 220 MG tablet Take 440 mg by mouth daily as needed (back pain).    . Omega-3 Fatty Acids (FISH OIL) 1200 MG CPDR Take 1 capsule by mouth 2 (two) times daily.    Marland Kitchen. omeprazole (PRILOSEC) 20 MG capsule Take 20 mg by mouth daily.    .Marland Kitchen  simvastatin (ZOCOR) 40 MG tablet Take 40 mg by mouth at bedtime.    Marland Kitchen HYDROcodone-acetaminophen (NORCO) 5-325 MG per tablet Take 1 tablet by mouth every 6 (six) hours as needed for moderate pain. (Patient not taking: Reported on 02/27/2016) 30 tablet 0   No  facility-administered medications prior to visit.     PAST MEDICAL HISTORY: Past Medical History:  Diagnosis Date  . Arthritis    HNP- low back   . Complication of anesthesia    pt. reports that when she wakes up she had a hard time catching her breath & the PACU team told her that her BP & pulse were not doing what it was suppose to do  . Headache(784.0)   . History of asthma    childhood, age 44  . History of kidney stones about 2008ish  . Hypertension    states she always has palpitations   . PONV (postoperative nausea and vomiting)   . Seizures (HCC) 02/2016  . Shortness of breath    with exertion    PAST SURGICAL HISTORY: Past Surgical History:  Procedure Laterality Date  . ABDOMINAL HYSTERECTOMY     1993  . BACK SURGERY     2000,01,10  . CYSTOSCOPY/RETROGRADE/URETEROSCOPY/STONE EXTRACTION WITH BASKET Left 03/01/2013   Procedure: CYSTOSCOPY/RETROGRADE/URETEROSCOPY/STONE EXTRACTION WITH BASKET/INSERTION LEFT DOUBLE  J STENT;  Surgeon: Su Grand, MD;  Location: [facility];  Service: Urology;  Laterality: Left;  . FINGER MASS EXCISION  2008  . FOOT TENDON SURGERY  1985  . HOLMIUM LASER APPLICATION Left 03/01/2013   Procedure: HOLMIUM LASER APPLICATION;  Surgeon: Su Grand, MD;  Location: [facility];  Service: Urology;  Laterality: Left;  . INNER EAR SURGERY     x2  . ROTATOR CUFF REPAIR     both shoulders done, last one - 2010  . TONSILLECTOMY      FAMILY HISTORY: Family History  Problem Relation Age of Onset  . Alzheimer's disease Mother   . Aneurysm Brother        brain, age 25    SOCIAL HISTORY: Social History   Social History  . Marital status: Married    Spouse name: [name]  . Number of children: 3  . Years of education: 9.5   Occupational History  .      retired   Social History Main Topics  . Smoking status: Passive Smoke Exposure - Never Smoker    Years: 30.00    Types: Cigarettes  . Smokeless tobacco: Never Used  . Alcohol use No  . Drug use: No   . Sexual activity: Not on file   Other Topics Concern  . Not on file   Social History Narrative   Lives with husband   Caffeine- coffee, 2-3 cups daily     PHYSICAL EXAM  Vitals:   09/02/16 1243  BP: (!) 129/59  Pulse: (!) 56  Weight: 210 lb 9.6 oz (95.5 kg)   Body mass index is 38.52 kg/m.  Generalized: Well developed, Obese female in no acute distress  Head: normocephalic and atraumatic,. Oropharynx benign  Neck: Supple, no carotid bruits  Cardiac: Regular rate rhythm, no murmur  Musculoskeletal: No deformity   Neurological examination   Mentation: Alert oriented to time, place, history taking. Attention span and concentration appropriate. Recent and remote memory intact.  Follows all commands speech and language fluent.   Cranial nerve II-XII: .Pupils were equal round reactive to light extraocular movements were full, visual field were full on confrontational test. Facial sensation  and strength were normal. hearing was intact to finger rubbing bilaterally. Uvula tongue midline. head turning and shoulder shrug were normal and symmetric.Tongue protrusion into cheek strength was normal. Motor: normal bulk and tone, full strength in the BUE, BLE, fine finger movements normal, no pronator drift. No focal weakness Sensory: normal and symmetric to light touch, pinprick, and  Vibration, in the upper and lower extremities  Coordination: finger-nose-finger, heel-to-shin bilaterally, no dysmetria Reflexes: 1+ upper lower and symmetric, plantar responses were flexor bilaterally. Gait and Station: Rising up from seated position without assistance, normal stance,  moderate stride, good arm swing, smooth turning, able to perform tiptoe, and heel walking without difficulty. Tandem gait is steady. No assistive device  DIAGNOSTIC DATA (LABS, IMAGING, TESTING) - I reviewed patient records, labs, notes, testing and imaging myself where available.  Lab Results  Component Value Date   WBC  7.8 02/27/2016   HGB 14.3 02/27/2016   HCT 43.1 02/27/2016   MCV 90.0 02/27/2016   PLT 239 02/27/2016      Component Value Date/Time   NA 140 02/27/2016 1929   K 4.3 02/27/2016 1929   CL 105 02/27/2016 1929   CO2 26 02/27/2016 1929   GLUCOSE 92 02/27/2016 1929   BUN 18 02/27/2016 1929   CREATININE 0.90 02/27/2016 1929   CALCIUM 9.7 02/27/2016 1929   PROT 6.9 02/27/2016 1929   ALBUMIN 4.1 02/27/2016 1929   AST 18 02/27/2016 1929   ALT 18 02/27/2016 1929   ALKPHOS 56 02/27/2016 1929   BILITOT 0.6 02/27/2016 1929   GFRNONAA >60 02/27/2016 1929   GFRAA >60 02/27/2016 1929   Lab Results  Component Value Date   CHOL 180 02/28/2016   HDL 62 02/28/2016   LDLCALC 97 02/28/2016   TRIG 105 02/28/2016   CHOLHDL 2.9 02/28/2016   Lab Results  Component Value Date   HGBA1C 5.6 02/28/2016    ASSESSMENT AND PLAN  76 y.o. year old female  has a past medical history of history of hypertension and asthma palpitations renal calculi headaches arthritis presenting to the hospital with speech difficulties staring episodes and unresponsiveness and postictal confusion with right hemianopsia episodes. MRI normal MRA normal MRI with contrast subtle swelling and blurring of left-campus suspected to be related to seizure. EEG mild cortical dysfunction in the left temporal region. Carotid Doppler 1-39% ICA plaquing 2-D echo EF 55-60%. LDL 97. Hemoglobin A1c 5.6.  The patient is a current patient of Dr. Roda ShuttersXu  who is out of the office today . This note is sent to the work in doctor.     Continue Keppra 500mg  twice daily will reorder Continue aspirin for secondary stroke prevention Continue Zocor for hyperlipidemia Keep B/P less than 130/80  todays reading 129/59 Call for seizure activity Follow up in 6 months.  I spent 20 minutes in total face to face time with the patient more than 50% of which was spent counseling and coordination of care, reviewing test results reviewing medications and discussing  and reviewing the diagnosis of seizure disorder and precautions and further treatment options. , Cline CrockNancy Carolyn Andrei Mccook, GNP, [facility], APRN  [facility] [address], [address] [place], [place] [address] [phone]

## 2016-09-02 ENCOUNTER — Encounter (INDEPENDENT_AMBULATORY_CARE_PROVIDER_SITE_OTHER): Payer: Self-pay

## 2016-09-02 ENCOUNTER — Encounter: Payer: Self-pay | Admitting: Nurse Practitioner

## 2016-09-02 ENCOUNTER — Ambulatory Visit (INDEPENDENT_AMBULATORY_CARE_PROVIDER_SITE_OTHER): Payer: Medicare Other | Admitting: Nurse Practitioner

## 2016-09-02 VITALS — BP 129/59 | HR 56 | Wt 210.6 lb

## 2016-09-02 DIAGNOSIS — I10 Essential (primary) hypertension: Secondary | ICD-10-CM

## 2016-09-02 DIAGNOSIS — E785 Hyperlipidemia, unspecified: Secondary | ICD-10-CM | POA: Diagnosis not present

## 2016-09-02 DIAGNOSIS — R569 Unspecified convulsions: Secondary | ICD-10-CM

## 2016-09-02 NOTE — Patient Instructions (Signed)
Continue Keppra 500mg  twice daily will reorder Continue aspirin for secondary stroke prevention Continue Zocor for hyperlipidemia Keep B/P less than 130/80  todays reading 129/59 Call for seizure activity Follow up in 6 months.

## 2016-10-09 NOTE — Progress Notes (Signed)
Personally  participated in, made any corrections needed, and agree with history, physical, neuro exam,assessment and plan as stated.     Lalita Ebel, MD [facility]     

## 2017-01-27 ENCOUNTER — Other Ambulatory Visit: Payer: Self-pay | Admitting: Nurse Practitioner

## 2017-03-03 NOTE — Progress Notes (Signed)
[facility]  PATIENT: Carol Gamble DOB: March 04, 1940   REASON FOR VISIT: follow-up for admission for word finding difficulty inability to express thoughts  Seizure disorder HISTORY FROM: Patient     HISTORY OF PRESENT ILLNESS: UPDATE 2/4/2019[name], 77 year old female returns for follow-up history of seizure disorder.  She has had episodes of word finding difficulty and inability to express her thoughts MRI of the brain and MRA were normal but EEG showed mild cortical dysfunction left temporal region.  She remains on Keppra 500 twice daily without further seizure events.  In addition she is on aspirin for secondary stroke prevention along with Zocor.  She is independent in activities of daily living.  She continues to drive without difficulty.  She returns for reevaluation    UPDATE  08/02/2018CM  Carol Gamble,  77 year-old female returns for follow-up seizure disorder. She had episode of word finding difficulty, and inability to express her thoughts. MRI and MRA were normal. EEG mild cortical dysfunction, left temporal region. She is currently on Keppra 500 twice daily without side effects and further seizure activity. She remains on aspirin for secondary stroke prevention and Zocor for hyperlipidemia. She returns for reevaluation HISTORY 05/03/16 Carol Gamble, 77 year old female with history of hypertension and asthma palpitations renal calculi headaches arthritis presenting to the hospital with speech difficulties staring episodes and unresponsiveness and postictal confusion with right hemianopsia episodes. MRI normal MRA normal MRI with contrast subtle swelling and blurring of left-campus suspected to be related to seizure. EEG mild cortical dysfunction in the left temporal region. Carotid Doppler 1-39% ICA plaquing 2-D echo EF 55-60%. LDL 97. Hemoglobin A1c 5.6. Patient was placed on Keppra 500 twice a day. She returns to the office today for hospital follow-up.  She has not had further episodes of seizure activity. She is currently on Keppra 500 twice daily without side effects. She is also on aspirin 0.81 for secondary stroke prevention. She is on Zocor for hyperlipidemia without complaints of myalgias. Blood pressure in the office today 126/75. She returns for reevaluation   REVIEW OF SYSTEMS: Full 14 system review of systems performed and notable only for those listed, all others are neg:  Constitutional: Fatigue  Cardiovascular: neg Ear/Nose/Throat: Hearing loss  Skin: neg Eyes: neg Respiratory: neg Gastroitestinal: neg  Hematology/Lymphatic: neg  Endocrine: neg Musculoskeletal:neg Allergy/Immunology: neg Neurological: neg Psychiatric: neg Sleep : neg   ALLERGIES: Allergies  Allergen Reactions  . Shrimp [Shellfish Allergy] Hives    HOME MEDICATIONS: Outpatient Medications Prior to Visit  Medication Sig Dispense Refill  . aspirin EC 81 MG EC tablet Take 1 tablet (81 mg total) by mouth daily. 30 tablet 1  . atenolol (TENORMIN) 50 MG tablet Take 50 mg by mouth daily with breakfast.    . CALCIUM-VITAMIN D PO Take 1 tablet by mouth 2 (two) times daily.    . diclofenac (VOLTAREN) 75 MG EC tablet 75 mg 2 (two) times daily.    . diphenhydramine-acetaminophen (TYLENOL PM) 25-500 MG TABS tablet Take 1 tablet by mouth at bedtime as needed.    . fluticasone (FLONASE) 50 MCG/ACT nasal spray Place 2 sprays into both nostrils daily as needed for allergies or rhinitis.     . hydroxypropyl methylcellulose (ISOPTO TEARS) 2.5 % ophthalmic solution Place 2 drops into both eyes 3 (three) times daily as needed for dry eyes.     Marland Kitchen levETIRAcetam (KEPPRA) 500 MG tablet Take 1 tablet (500 mg total) by mouth 2 (two) times daily. 180 tablet  3  . lisinopril-hydrochlorothiazide (PRINZIDE,ZESTORETIC) 20-12.5 MG per tablet Take 1 tablet by mouth daily with breakfast.    . Multiple Vitamin (MULTIVITAMIN WITH MINERALS) TABS Take 1 tablet by mouth daily.    .  Omega-3 Fatty Acids (FISH OIL) 1200 MG CPDR Take 1 capsule by mouth 2 (two) times daily.    Marland Kitchen omeprazole (PRILOSEC) 20 MG capsule Take 20 mg by mouth daily.    . simvastatin (ZOCOR) 40 MG tablet Take 40 mg by mouth at bedtime.    Marland Kitchen ibuprofen (ADVIL,MOTRIN) 200 MG tablet Take 400 mg by mouth daily as needed for mild pain.    . naproxen sodium (ANAPROX) 220 MG tablet Take 440 mg by mouth daily as needed (back pain).     No facility-administered medications prior to visit.     PAST MEDICAL HISTORY: Past Medical History:  Diagnosis Date  . Arthritis    HNP- low back   . Complication of anesthesia    pt. reports that when she wakes up she had a hard time catching her breath & the PACU team told her that her BP & pulse were not doing what it was suppose to do  . Headache(784.0)   . History of asthma    childhood, age 32  . History of kidney stones about 2008ish  . Hypertension    states she always has palpitations   . PONV (postoperative nausea and vomiting)   . Seizures (HCC) 02/2016  . Shortness of breath    with exertion    PAST SURGICAL HISTORY: Past Surgical History:  Procedure Laterality Date  . ABDOMINAL HYSTERECTOMY     1993  . BACK SURGERY     2000,01,10  . CYSTOSCOPY/RETROGRADE/URETEROSCOPY/STONE EXTRACTION WITH BASKET Left 03/01/2013   Procedure: CYSTOSCOPY/RETROGRADE/URETEROSCOPY/STONE EXTRACTION WITH BASKET/INSERTION LEFT DOUBLE  J STENT;  Surgeon: Su Grand, MD;  Location: [facility];  Service: Urology;  Laterality: Left;  . FINGER MASS EXCISION  2008  . FOOT TENDON SURGERY  1985  . HOLMIUM LASER APPLICATION Left 03/01/2013   Procedure: HOLMIUM LASER APPLICATION;  Surgeon: Su Grand, MD;  Location: [facility];  Service: Urology;  Laterality: Left;  . INNER EAR SURGERY     x2  . ROTATOR CUFF REPAIR     both shoulders done, last one - 2010  . TONSILLECTOMY      FAMILY HISTORY: Family History  Problem Relation Age of Onset  . Alzheimer's disease Mother   . Aneurysm  Brother        brain, age 69    SOCIAL HISTORY: Social History   Socioeconomic History  . Marital status: Married    Spouse name: [name]  . Number of children: 3  . Years of education: 9.5  . Highest education level: Not on file  Social Needs  . Financial resource strain: Not on file  . Food insecurity - worry: Not on file  . Food insecurity - inability: Not on file  . Transportation needs - medical: Not on file  . Transportation needs - non-medical: Not on file  Occupational History    Comment: retired  Tobacco Use  . Smoking status: Passive Smoke Exposure - Never Smoker  . Smokeless tobacco: Never Used  Substance and Sexual Activity  . Alcohol use: No  . Drug use: No  . Sexual activity: Not on file  Other Topics Concern  . Not on file  Social History Narrative   Lives with husband   Caffeine- coffee, 2-3 cups daily  PHYSICAL EXAM  Vitals:   03/07/17 1034  BP: 126/81  Pulse: 75  Weight: 215 lb 9.6 oz (97.8 kg)  Height: 5\' 2"  (1.575 m)   Body mass index is 39.43 kg/m.  Generalized: Well developed, Obese female in no acute distress  Head: normocephalic and atraumatic,. Oropharynx benign  Neck: Supple, no carotid bruits  Cardiac: Regular rate rhythm, no murmur  Musculoskeletal: No deformity   Neurological examination   Mentation: Alert oriented to time, place, history taking. Attention span and concentration appropriate. Recent and remote memory intact.  Follows all commands speech and language fluent.   Cranial nerve II-XII: .Pupils were equal round reactive to light extraocular movements were full, visual field were full on confrontational test. Facial sensation and strength were normal. hearing was intact to finger rubbing bilaterally. Uvula tongue midline. head turning and shoulder shrug were normal and symmetric.Tongue protrusion into cheek strength was normal. Motor: normal bulk and tone, full strength in the BUE, BLE, fine finger movements normal, no  pronator drift. No focal weakness Sensory: normal and symmetric to light touch,  Coordination: finger-nose-finger, heel-to-shin bilaterally, no dysmetria Reflexes: 1+ upper lower and symmetric, plantar responses were flexor bilaterally. Gait and Station: Rising up from seated position without assistance, normal stance,  moderate stride, good arm swing, smooth turning, able to perform tiptoe, and heel walking without difficulty. Tandem gait is steady. No assistive device  DIAGNOSTIC DATA (LABS, IMAGING, TESTING) - I reviewed patient records, labs, notes, testing and imaging myself where available.  Lab Results  Component Value Date   WBC 7.8 02/27/2016   HGB 14.3 02/27/2016   HCT 43.1 02/27/2016   MCV 90.0 02/27/2016   PLT 239 02/27/2016      Component Value Date/Time   NA 140 02/27/2016 1929   K 4.3 02/27/2016 1929   CL 105 02/27/2016 1929   CO2 26 02/27/2016 1929   GLUCOSE 92 02/27/2016 1929   BUN 18 02/27/2016 1929   CREATININE 0.90 02/27/2016 1929   CALCIUM 9.7 02/27/2016 1929   PROT 6.9 02/27/2016 1929   ALBUMIN 4.1 02/27/2016 1929   AST 18 02/27/2016 1929   ALT 18 02/27/2016 1929   ALKPHOS 56 02/27/2016 1929   BILITOT 0.6 02/27/2016 1929   GFRNONAA >60 02/27/2016 1929   GFRAA >60 02/27/2016 1929   Lab Results  Component Value Date   CHOL 180 02/28/2016   HDL 62 02/28/2016   LDLCALC 97 02/28/2016   TRIG 105 02/28/2016   CHOLHDL 2.9 02/28/2016   Lab Results  Component Value Date   HGBA1C 5.6 02/28/2016    ASSESSMENT AND PLAN  77 y.o. year old female  has a past medical history of history of hypertension and asthma palpitations renal calculi headaches arthritis presenting to the hospital with speech difficulties staring episodes and unresponsiveness and postictal confusion with right hemianopsia episodes. MRI normal MRA normal MRI with contrast subtle swelling and blurring of left-campus suspected to be related to seizure. EEG mild cortical dysfunction in the left  temporal region. Carotid Doppler 1-39% ICA plaquing 2-D echo EF 55-60%. LDL 97. Hemoglobin A1c 5.6.  The patient is a current patient of Dr. Roda ShuttersXu  who is out of the office today . This note is sent to the work in doctor.     Continue Keppra 500mg  twice daily will reorder Continue aspirin for secondary stroke prevention Continue Zocor for hyperlipidemia Keep B/P less than 130/80  todays reading 126/81 Call for seizure activity Follow up  10 months I spent 20  minutes in total face to face time with the patient more than 50% of which was spent counseling and coordination of care, reviewing test results reviewing medications and discussing and reviewing the diagnosis of seizure disorder and precautions and further treatment options.  Patient encouraged to be more physically active and lose weight., [name], [facility], [facility], APRN  [facility] [address] [place], [place] [address] [phone]

## 2017-03-07 ENCOUNTER — Ambulatory Visit: Payer: Medicare Other | Admitting: Nurse Practitioner

## 2017-03-07 ENCOUNTER — Encounter (INDEPENDENT_AMBULATORY_CARE_PROVIDER_SITE_OTHER): Payer: Self-pay

## 2017-03-07 ENCOUNTER — Encounter: Payer: Self-pay | Admitting: Nurse Practitioner

## 2017-03-07 VITALS — BP 126/81 | HR 75 | Ht 62.0 in | Wt 215.6 lb

## 2017-03-07 DIAGNOSIS — I10 Essential (primary) hypertension: Secondary | ICD-10-CM

## 2017-03-07 DIAGNOSIS — E785 Hyperlipidemia, unspecified: Secondary | ICD-10-CM | POA: Diagnosis not present

## 2017-03-07 DIAGNOSIS — R569 Unspecified convulsions: Secondary | ICD-10-CM

## 2017-03-07 MED ORDER — LEVETIRACETAM 500 MG PO TABS: 500.0000 mg | ORAL_TABLET | Freq: Two times a day (BID) | ORAL | 3 refills | Status: DC

## 2017-03-07 NOTE — Patient Instructions (Signed)
Continue Keppra 500mg  twice daily will reorder Continue aspirin for secondary stroke prevention Continue Zocor for hyperlipidemia Keep B/P less than 130/80  todays reading 126/81 Call for seizure activity Follow up  10 months

## 2017-03-08 NOTE — Progress Notes (Signed)
I reviewed note and agree with plan.   Genisis Sonnier R. Steadman Prosperi, MD 03/08/2017, 1:18 PM Certified in Neurology, Neurophysiology and Neuroimaging  [facility] [address], [address] [place], [place] [address] [phone]  

## 2017-05-18 ENCOUNTER — Telehealth: Payer: Self-pay

## 2017-05-18 NOTE — Telephone Encounter (Signed)
   [facility] Pre-operative Risk Assessment    Request for surgical clearance:  1. What type of surgery is being performed? Left Partial Knee Replacement   2. When is this surgery scheduled? 07/01/2017   3. What type of clearance is required (medical clearance vs. Pharmacy clearance to hold med vs. Both)? pharmacy  4. Are there any medications that need to be held prior to surgery and how long?unknown   5. Practice name and name of physician performing surgery? [facility] Dr [facility]onia [facility]  6. What is your office phone [phone]    7.   What is your office fax [phone]  8.   Anesthesia type (None, local, MAC, general) ? unknown   Frederik Schmidt 05/18/2017, 4:53 PM  _________________________________________________________________   (provider comments below)

## 2017-05-19 NOTE — Telephone Encounter (Signed)
  Pt is scheduled for new patient appt for preoperative cardiac assessment for clearance for knee surgery.

## 2017-05-20 ENCOUNTER — Telehealth: Payer: Self-pay | Admitting: *Deleted

## 2017-05-20 ENCOUNTER — Other Ambulatory Visit: Payer: Self-pay | Admitting: Family Medicine

## 2017-05-20 DIAGNOSIS — Z139 Encounter for screening, unspecified: Secondary | ICD-10-CM

## 2017-05-20 NOTE — Telephone Encounter (Signed)
Received pre operative clearance form to be filled out for L partial knee replacement (neuro ).  Form completed and signed by CM/NP.  Fax confirmation received 05-19-17 [phone].

## 2017-06-02 ENCOUNTER — Encounter (INDEPENDENT_AMBULATORY_CARE_PROVIDER_SITE_OTHER): Payer: Self-pay

## 2017-06-02 ENCOUNTER — Encounter: Payer: Self-pay | Admitting: Cardiovascular Disease

## 2017-06-02 ENCOUNTER — Ambulatory Visit: Payer: Medicare Other | Admitting: Cardiovascular Disease

## 2017-06-02 VITALS — BP 108/88 | HR 58 | Ht 62.0 in | Wt 208.0 lb

## 2017-06-02 DIAGNOSIS — I481 Persistent atrial fibrillation: Secondary | ICD-10-CM

## 2017-06-02 DIAGNOSIS — I4819 Other persistent atrial fibrillation: Secondary | ICD-10-CM

## 2017-06-02 NOTE — Patient Instructions (Addendum)

## 2017-06-02 NOTE — Progress Notes (Signed)
Cardiology Office Note   Date:  06/02/2017   ID:  Carol Gamble Sep 25, 1940, MRN [id number]  PCP:  Blair Heys, MD  Cardiologist:   Charlton Haws, MD   No chief complaint on file.     History of Present Illness: Carol Gamble is a 77 y.o. female who presents for consultation regarding atrial fibrillation Referred by Dr Renaye Rakers for cardiac clearance for TKR. Reviewed office note from [facility] Dr Manus Gunning History of palpitations with PVC;s. Noted to be in afib 05/31/17 started on eliquis   Labs reviewed Hct 43.7 PLT 221 Cr .89   She is sedentary and retired use to work at [employer] Husband has afib on eliquis and flecainide 3 sons live near by  She has no syncope, palpitations, chest pain or dyspnea No bleeding issues. Will need both knees Operated on eventually    Past Medical History:  Diagnosis Date  . Arthritis    HNP- low back   . Complication of anesthesia    pt. reports that when she wakes up she had a hard time catching her breath & the PACU team told her that her BP & pulse were not doing what it was suppose to do  . Dyslipidemia 02/27/2016  . Headache(784.0)   . History of asthma    childhood, age 2  . History of kidney stones about 2008ish  . HNP (herniated nucleus pulposus), lumbar 06/30/2012  . Hypertension    states she always has palpitations   . PONV (postoperative nausea and vomiting)   . Seizure (HCC)   . Seizures (HCC) 02/2016  . Shortness of breath    with exertion  . Stroke-like symptoms 02/27/2016    Past Surgical History:  Procedure Laterality Date  . ABDOMINAL HYSTERECTOMY     1993  . BACK SURGERY     2000,01,10  . CYSTOSCOPY/RETROGRADE/URETEROSCOPY/STONE EXTRACTION WITH BASKET Left 03/01/2013   Procedure: CYSTOSCOPY/RETROGRADE/URETEROSCOPY/STONE EXTRACTION WITH BASKET/INSERTION LEFT DOUBLE  J STENT;  Surgeon: Su Grand, MD;  Location: [facility];  Service: Urology;  Laterality: Left;  . FINGER MASS EXCISION  2008  . FOOT  TENDON SURGERY  1985  . HOLMIUM LASER APPLICATION Left 03/01/2013   Procedure: HOLMIUM LASER APPLICATION;  Surgeon: Su Grand, MD;  Location: [facility];  Service: Urology;  Laterality: Left;  . INNER EAR SURGERY     x2  . ROTATOR CUFF REPAIR     both shoulders done, last one - 2010  . TONSILLECTOMY       Current Outpatient Medications  Medication Sig Dispense Refill  . apixaban (ELIQUIS) 5 MG TABS tablet Take 5 mg by mouth 2 (two) times daily.    Marland Kitchen aspirin EC 81 MG EC tablet Take 1 tablet (81 mg total) by mouth daily. 30 tablet 1  . atenolol (TENORMIN) 50 MG tablet Take 50 mg by mouth daily with breakfast.    . CALCIUM-VITAMIN D PO Take 1 tablet by mouth 2 (two) times daily.    . diclofenac (VOLTAREN) 75 MG EC tablet 75 mg 2 (two) times daily.    . diphenhydramine-acetaminophen (TYLENOL PM) 25-500 MG TABS tablet Take 1 tablet by mouth as directed.    . fluticasone (FLONASE) 50 MCG/ACT nasal spray Place 2 sprays into both nostrils daily as needed for allergies or rhinitis.     . hydroxypropyl methylcellulose (ISOPTO TEARS) 2.5 % ophthalmic solution Place 2 drops into both eyes 3 (three) times daily as needed for dry eyes.     Marland Kitchen  levETIRAcetam (KEPPRA) 500 MG tablet Take 1 tablet (500 mg total) by mouth 2 (two) times daily. 180 tablet 3  . lisinopril-hydrochlorothiazide (PRINZIDE,ZESTORETIC) 20-12.5 MG per tablet Take 1 tablet by mouth daily with breakfast.    . Multiple Vitamin (MULTIVITAMIN WITH MINERALS) TABS Take 1 tablet by mouth daily.    . Omega-3 Fatty Acids (FISH OIL) 1200 MG CPDR Take 1 capsule by mouth 2 (two) times daily.    Marland Kitchen omeprazole (PRILOSEC) 20 MG capsule Take 20 mg by mouth daily.    . simvastatin (ZOCOR) 40 MG tablet Take 40 mg by mouth at bedtime.     No current facility-administered medications for this visit.     Allergies:   Shrimp [shellfish allergy]    Social History:  The patient  reports that she is a non-smoker but has been exposed to tobacco smoke. She has  been exposed to tobacco smoke for the past 30.00 years. She has never used smokeless tobacco. She reports that she does not drink alcohol or use drugs.   Family History:  The patient's family history includes Alzheimer's disease in her mother; Aneurysm in her brother.    ROS:  Please see the history of present illness.   Otherwise, review of systems are positive for none.   All other systems are reviewed and negative.    PHYSICAL EXAM: VS:  BP 108/88   Pulse (!) 58   Ht  (1.575 m)   Wt 208 lb (94.3 kg)   SpO2 96%   BMI 38.04 kg/m  , BMI Body mass index is 38.04 kg/m. Affect appropriate Obese white female  HEENT: normal Neck supple with no adenopathy JVP normal no bruits no thyromegaly Lungs clear with no wheezing and good diaphragmatic motion Heart:  S1/S2 no murmur, no rub, gallop or click PMI normal Abdomen: benighn, BS positve, no tenderness, no AAA no bruit.  No HSM or HJR Distal pulses intact with no bruits Plus one bilateral edema with varicosities  Neuro non-focal Skin warm and dry No muscular weakness    EKG:  afib rate 83 nonspecific ST changes    Recent Labs: No results found for requested labs within last 8760 hours.    Lipid Panel    Component Value Date/Time   CHOL 180 02/28/2016 0749   TRIG 105 02/28/2016 0749   HDL 62 02/28/2016 0749   CHOLHDL 2.9 02/28/2016 0749   VLDL 21 02/28/2016 0749   LDLCALC 97 02/28/2016 0749      Wt Readings from Last 3 Encounters:  06/02/17 208 lb (94.3 kg)  03/07/17 215 lb 9.6 oz (97.8 kg)  09/02/16 210 lb 9.6 oz (95.5 kg)      Other studies Reviewed: Additional studies/ records that were reviewed today include: notes primary [facility] 05/31/17 labs And ECG .    ASSESSMENT AND PLAN:  1.  PAF:  persistent afib asymptomatic duration unknown. She is totally asymptomatic Discussed options of rate control and anticoagulation vs more aggressive cardioversion Since she is asymptomatic favor former. Continue  atenolol and eliquis F/U TTE so long as no severe valve disease and normal EF will clear for surgery  2. OA:  She will need meticulous DVT prophylaxis and resume eliquis post op Needs to hold DOAC 2 days before surgery and does not need any bridging  3. HLD:  On statin LFTls normal f/u primary    Current medicines are reviewed at length with the patient today.  The patient does not have concerns regarding medicines.  The following changes have been made:  no change  Labs/ tests ordered today include: TTE No orders of the defined types were placed in this encounter.    Disposition:   FU with cardiology in a year      Signed, Charlton Haws, MD  06/02/2017 9:47 AM    [facility] [address] [place], [place], [place]  [address] Phone: [phone]; Fax: [phone]

## 2017-06-08 ENCOUNTER — Other Ambulatory Visit: Payer: Self-pay

## 2017-06-08 ENCOUNTER — Ambulatory Visit (HOSPITAL_COMMUNITY): Payer: Medicare Other | Attending: Cardiology

## 2017-06-08 DIAGNOSIS — I083 Combined rheumatic disorders of mitral, aortic and tricuspid valves: Secondary | ICD-10-CM | POA: Insufficient documentation

## 2017-06-08 DIAGNOSIS — Z8673 Personal history of transient ischemic attack (TIA), and cerebral infarction without residual deficits: Secondary | ICD-10-CM | POA: Insufficient documentation

## 2017-06-08 DIAGNOSIS — E785 Hyperlipidemia, unspecified: Secondary | ICD-10-CM | POA: Diagnosis not present

## 2017-06-08 DIAGNOSIS — I481 Persistent atrial fibrillation: Secondary | ICD-10-CM

## 2017-06-08 DIAGNOSIS — I119 Hypertensive heart disease without heart failure: Secondary | ICD-10-CM | POA: Insufficient documentation

## 2017-06-08 DIAGNOSIS — I4891 Unspecified atrial fibrillation: Secondary | ICD-10-CM | POA: Diagnosis present

## 2017-06-08 DIAGNOSIS — I4819 Other persistent atrial fibrillation: Secondary | ICD-10-CM

## 2017-06-09 ENCOUNTER — Ambulatory Visit
Admission: RE | Admit: 2017-06-09 | Discharge: 2017-06-09 | Disposition: A | Discharge status: Home / Self Care | Payer: Medicare Other | Source: Ambulatory Visit | Attending: Family Medicine | Admitting: Family Medicine

## 2017-06-09 DIAGNOSIS — Z139 Encounter for screening, unspecified: Secondary | ICD-10-CM

## 2017-06-21 ENCOUNTER — Telehealth: Payer: Self-pay

## 2017-06-21 NOTE — Telephone Encounter (Signed)
   [facility] Pre-operative Risk Assessment    Request for surgical clearance:  1. What type of surgery is being performed? Left Partial Knee Replacement   2. When is this surgery scheduled? Pending clearance   3. What type of clearance is required (medical clearance vs. Pharmacy clearance to hold med vs. Both)? Both  4. Are there any medications that need to be held prior to surgery and how long? Eliquis   5. Practice name and name of physician performing surgery?  [facility], Dr. Percell Miller   6. What is your office phone number [phone] ext [phone] Carol Gamble    7.   What is your office fax number [phone] att: Carol Gamble  8.   Anesthesia type (None, local, MAC, general) ?  Unknown   Carol Gamble 06/21/2017, 10:33 AM  _________________________________________________________________   (provider comments below)

## 2017-06-22 NOTE — Telephone Encounter (Signed)
   Primary Cardiologist: Charlton Haws, MD  Chart reviewed as part of pre-operative protocol coverage. Patient is already cleared and advised to hold Eliquis 2 day prior to surgery by Dr. Eden Emms 06/02/17 "normal EF will clear for surgery . She will need meticulous DVT prophylaxis and resume eliquis post op. Needs to hold DOAC 2 days before surgery and does not need any bridging".   I will route this recommendation to the requesting party via Epic fax function and remove from pre-op pool.  Please call with questions.  [employer], [place] 06/22/2017, 3:52 PM

## 2017-07-01 ENCOUNTER — Ambulatory Visit: Payer: Medicare Other | Admitting: Cardiology

## 2017-09-14 DIAGNOSIS — Z981 Arthrodesis status: Secondary | ICD-10-CM

## 2017-09-14 DIAGNOSIS — M1712 Unilateral primary osteoarthritis, left knee: Secondary | ICD-10-CM | POA: Diagnosis present

## 2017-09-14 NOTE — H&P (Signed)
KNEE ARTHROPLASTY ADMISSION H&P  Patient ID: Carol Gamble MRN: [id number] DOB/AGE: 07-11-1940 77 y.o.  Chief Complaint: left knee pain.  Planned Procedure Date: 10/04/17 Medical Clearance by Dr. Manus GunningEhinger     Cardiac Clearance by Dr. Eden EmmsNishan Additional clearance by Beverely LowNancy Martin, NP   HPI: Carol HartMavis C Baeten is a 77 y.o. female with a history of Afib on Eliquis, PONV, Single Seizure now controlled on Keppra, Lumbar spine surgery/fusion, HTN, and HLD who presents for evaluation of OA LEFT KNEE. The patient has a history of pain and functional disability in the left knee due to arthritis and has failed non-surgical conservative treatments for greater than 12 weeks to include NSAID's and/or analgesics, corticosteriod injections, viscosupplementation injections, use of assistive devices and activity modification.  Onset of symptoms was abrupt, starting 6 + months ago with gradually worsening course since that time. Patient currently rates pain at 9 out of 10 with activity. Patient has night pain, worsening of pain with activity and weight bearing and pain that interferes with activities of daily living.  Patient has evidence of subchondral cysts, subchondral sclerosis, periarticular osteophytes and joint space narrowing by imaging studies.  Preserved lateral space and good medial opening on stress view.  There is no active infection.  Past Medical History:  Diagnosis Date  . Arthritis    HNP- low back   . Complication of anesthesia    pt. reports that when she wakes up she had a hard time catching her breath & the PACU team told her that her BP & pulse were not doing what it was suppose to do  . Dyslipidemia 02/27/2016  . Headache(784.0)   . History of asthma    childhood, age 89  . History of kidney stones about 2008ish  . HNP (herniated nucleus pulposus), lumbar 06/30/2012  . Hypertension    states she always has palpitations   . PONV (postoperative nausea and vomiting)   . Seizure (HCC)   .  Seizures (HCC) 02/2016  . Shortness of breath    with exertion  . Stroke-like symptoms 02/27/2016   Past Surgical History:  Procedure Laterality Date  . ABDOMINAL HYSTERECTOMY     1993  . BACK SURGERY     2000,01,10  . CYSTOSCOPY/RETROGRADE/URETEROSCOPY/STONE EXTRACTION WITH BASKET Left 03/01/2013   Procedure: CYSTOSCOPY/RETROGRADE/URETEROSCOPY/STONE EXTRACTION WITH BASKET/INSERTION LEFT DOUBLE  J STENT;  Surgeon: Su GrandMarc Nesi, MD;  Location: [facility];  Service: Urology;  Laterality: Left;  . FINGER MASS EXCISION  2008  . FOOT TENDON SURGERY  1985  . HOLMIUM LASER APPLICATION Left 03/01/2013   Procedure: HOLMIUM LASER APPLICATION;  Surgeon: Su GrandMarc Nesi, MD;  Location: [facility];  Service: Urology;  Laterality: Left;  . INNER EAR SURGERY     x2  . ROTATOR CUFF REPAIR     both shoulders done, last one - 2010  . TONSILLECTOMY     Allergies  Allergen Reactions  . Shrimp [Shellfish Allergy] Hives   Prior to Admission medications   Medication Sig Start Date End Date Taking? Authorizing Provider  apixaban (ELIQUIS) 5 MG TABS tablet Take 5 mg by mouth 2 (two) times daily.    [provider]  aspirin EC 81 MG EC tablet Take 1 tablet (81 mg total) by mouth daily. 03/01/16   Leatha GildingGherghe, Costin M, MD  atenolol (TENORMIN) 50 MG tablet Take 50 mg by mouth daily with breakfast.    [provider]  CALCIUM-VITAMIN D PO Take 1 tablet by mouth 2 (two) times daily.  [provider]  diclofenac (VOLTAREN) 75 MG EC tablet 75 mg 2 (two) times daily. 08/24/16   [provider]  diphenhydramine-acetaminophen (TYLENOL PM) 25-500 MG TABS tablet Take 1 tablet by mouth as directed.    [provider]  fluticasone (FLONASE) 50 MCG/ACT nasal spray Place 2 sprays into both nostrils daily as needed for allergies or rhinitis.     [provider]  hydroxypropyl methylcellulose (ISOPTO TEARS) 2.5 % ophthalmic solution Place 2 drops into both eyes 3 (three) times daily as needed  for dry eyes.     [provider]  levETIRAcetam (KEPPRA) 500 MG tablet Take 1 tablet (500 mg total) by mouth 2 (two) times daily. 03/07/17   [name], NP  lisinopril-hydrochlorothiazide (PRINZIDE,ZESTORETIC) 20-12.5 MG per tablet Take 1 tablet by mouth daily with breakfast.    [provider]  meloxicam (MOBIC) 7.5 MG tablet Take 7.5 mg by mouth daily. For 90 days    [provider]  Multiple Vitamin (MULTIVITAMIN WITH MINERALS) TABS Take 1 tablet by mouth daily.    [provider]  Omega-3 Fatty Acids (FISH OIL) 1200 MG CPDR Take 1 capsule by mouth 2 (two) times daily.    [provider]  omeprazole (PRILOSEC) 20 MG capsule Take 20 mg by mouth daily. 02/03/16   [provider]  simvastatin (ZOCOR) 40 MG tablet Take 40 mg by mouth at bedtime.    [provider]   Social History   Socioeconomic History  . Marital status: Married    Spouse name: [name]  . Number of children: 3  . Years of education: 9.5  . Highest education level: Not on file  Occupational History    Comment: retired  Engineer, production  . Financial resource strain: Not on file  . Food insecurity:    Worry: Not on file    Inability: Not on file  . Transportation needs:    Medical: Not on file    Non-medical: Not on file  Tobacco Use  . Smoking status: Passive Smoke Exposure - Never Smoker  . Smokeless tobacco: Never Used  Substance and Sexual Activity  . Alcohol use: No  . Drug use: No  . Sexual activity: Not on file  Lifestyle  . Physical activity:    Days per week: Not on file    Minutes per session: Not on file  . Stress: Not on file  Relationships  . Social connections:    Talks on phone: Not on file    Gets together: Not on file    Attends religious service: Not on file    Active member of club or organization: Not on file    Attends meetings of clubs or organizations: Not on file    Relationship status: Not on file  Other Topics  Concern  . Not on file  Social History Narrative   Lives with husband   Caffeine- coffee, 2-3 cups daily   Family History  Problem Relation Age of Onset  . Alzheimer's disease Mother   . Aneurysm Brother        brain, age 69    ROS: Currently denies lightheadedness, dizziness, Fever, chills, CP, SOB.   No personal history of DVT, PE, MI, or CVA. Upper denture, bottom partial.  She has cataracts and wears glasses.  Occasional headache. All other systems have been reviewed and were otherwise currently negative with the exception of those mentioned in the HPI and as above.  Objective: Vitals: Ht: 5 feet  3 inches wt: 214 temp: 97.6 BP: 135/83 pulse: 85 O2 97 % on room air.   Physical Exam: General: Alert, NAD.  Antalgic Gait  HEENT: EOMI, Good Neck Extension  Pulm: No increased work of breathing.  Clear B/L A/P w/o crackle or wheeze.  CV: Irregularly irregular rhythm, No m/g/r appreciated  GI: soft, NT, ND Neuro: Neuro without gross focal deficit.  Sensation intact distally Skin: No lesions in the area of chief complaint MSK/Surgical Site: Left knee w/o redness or effusion.  Medial JLT. ROM 2-95.  5/5 strength in extension and flexion.  +EHL/FHL.  NVI.  Stable varus and valgus stress.    Imaging Review Plain radiographs demonstrate severe degenerative joint disease of both knees.  Preserved lateral space and good medial opening on Right knee stress view.  Preoperative templating of the joint replacement has been completed, documented, and submitted to the Operating Room personnel in order to optimize intra-operative equipment management.  Assessment: OA LEFT KNEE Principal Problem:   Primary osteoarthritis of left knee Active Problems:   HNP (herniated nucleus pulposus), lumbar   HTN (hypertension)   Dyslipidemia   Seizure (HCC)   History of lumbar spinal fusion   Plan: Plan for Procedure(s): UNICOMPARTMENTAL LEFT  KNEE  The patient history, physical exam, clinical  judgement of the provider and imaging are consistent with end stage degenerative joint disease and unicompartmental joint arthroplasty is deemed medically necessary. The treatment options including medical management, injection therapy, and arthroplasty were discussed at length. The risks and benefits of Procedure(s): UNICOMPARTMENTAL LEFT  KNEE were presented and reviewed.  The risks of nonoperative treatment, versus surgical intervention including but not limited to continued pain, aseptic loosening, stiffness, dislocation/subluxation, infection, bleeding, nerve injury, blood clots, cardiopulmonary complications, morbidity, mortality, among others were discussed. The patient verbalizes understanding and wishes to proceed with the plan.  Patient is being admitted for inpatient treatment for surgery, pain control, PT, OT, prophylactic antibiotics, VTE prophylaxis, progressive ambulation, ADL's and discharge planning.   Dental prophylaxis discussed and recommended for 2 years postoperatively.   The patient does meet the criteria for TXA which will be used perioperatively.    Eliquis will Resume postoperatively for her AFib and for DVT prophylaxis in addition to SCDs, and early ambulation.  The patient is planning to be discharged home with home health services ([facility]) in care of her husband, son, son-in-law and daughter.  She has been advised not to take PO nsaids dt afib / on eliquis.   Patient's anticipated LOS is less than 2 midnights, meeting these requirements: - Lives within 1 hour of care - Has a competent adult at home to recover with post-op recover - NO history of  - Chronic pain requiring opiods  - Diabetes  - Coronary Artery Disease  - Heart failure  - Heart attack  - Stroke  - DVT/VTE  - Respiratory Failure/COPD  - Renal failure  - Anemia  - Advanced Liver disease   Albina BilletHenry Calvin Martensen III, PA-C 09/14/2017 1:50 PM

## 2017-09-27 NOTE — Patient Instructions (Signed)
Carol Gamble  09/27/2017   Your procedure is scheduled on: 10/04/2017   [facility]  Report to admitting at   0800 AM    Call this number if you have problems the morning of surgery [phone]   Remember: Do not eat food or drink liquids :After Midnight.     Take these medicines the morning of surgery with A SIP OF WATER: Atenolol ( Tenormin), Eye drops as usual, Flonase, Keppra, Prilosec                                 You may not have any metal on your body including hair pins and              piercings  Do not wear jewelry, make-up, lotions, powders or perfumes, deodorant             Do not wear nail polish.  Do not shave  48 hours prior to surgery.     Do not bring valuables to the hospital. [facility] IS NOT             RESPONSIBLE   FOR VALUABLES.  Contacts, dentures or bridgework may not be worn into surgery.  Leave suitcase in the car. After surgery it may be brought to your room.                    Please read over the following fact sheets you were given: _____________________________________________________________________             [facility] Pre[facility] Before surgery, you can play an important role.  Because skin is not sterile, your skin needs to be as free of germs as possible.  You can reduce the number of germs on your skin by washing with CHG (chlorahexidine gluconate) soap before surgery.  CHG is an antiseptic cleaner which kills germs and bonds with the skin to continue killing germs even after washing. Please DO NOT use if you have an allergy to CHG or antibacterial soaps.  If your skin becomes reddened/irritated stop using the CHG and inform your nurse when you arrive at Short Stay. Do not shave (including legs and underarms) for at least 48 hours prior to the first CHG shower.  You may shave your face/neck. Please follow these instructions carefully:  1.  Shower with CHG Soap the night  before surgery and the  morning of Surgery.  2.  If you choose to wash your hair, wash your hair first as usual with your  normal  shampoo.  3.  After you shampoo, rinse your hair and body thoroughly to remove the  shampoo.                           4.  Use CHG as you would any other liquid soap.  You can apply chg directly  to the skin and wash                       Gently with a scrungie or clean washcloth.  5.  Apply the CHG Soap to your body ONLY FROM THE NECK DOWN.   Do not use on face/ open  Wound or open sores. Avoid contact with eyes, ears mouth and genitals (private parts).                       Wash face,  Genitals (private parts) with your normal soap.             6.  Wash thoroughly, paying special attention to the area where your surgery  will be performed.  7.  Thoroughly rinse your body with warm water from the neck down.  8.  DO NOT shower/wash with your normal soap after using and rinsing off  the CHG Soap.                9.  Pat yourself dry with a clean towel.            10.  Wear clean pajamas.            11.  Place clean sheets on your bed the night of your first shower and do not  sleep with pets. Day of Surgery : Do not apply any lotions/deodorants the morning of surgery.  Please wear clean clothes to the hospital/surgery center.  FAILURE TO FOLLOW THESE INSTRUCTIONS MAY RESULT IN THE CANCELLATION OF YOUR SURGERY PATIENT SIGNATURE_________________________________  NURSE SIGNATURE__________________________________  ________________________________________________________________________   Carol Gamble  An incentive spirometer is a tool that can help keep your lungs clear and active. This tool measures how well you are filling your lungs with each breath. Taking long deep breaths may help reverse or decrease the chance of developing breathing (pulmonary) problems (especially infection) following:  A long period of time when you are  unable to move or be active. BEFORE THE PROCEDURE   If the spirometer includes an indicator to show your best effort, your nurse or respiratory therapist will set it to a desired goal.  If possible, sit up straight or lean slightly forward. Try not to slouch.  Hold the incentive spirometer in an upright position. INSTRUCTIONS FOR USE  1. Sit on the edge of your bed if possible, or sit up as far as you can in bed or on a chair. 2. Hold the incentive spirometer in an upright position. 3. Breathe out normally. 4. Place the mouthpiece in your mouth and seal your lips tightly around it. 5. Breathe in slowly and as deeply as possible, raising the piston or the ball toward the top of the column. 6. Hold your breath for 3-5 seconds or for as long as possible. Allow the piston or ball to fall to the bottom of the column. 7. Remove the mouthpiece from your mouth and breathe out normally. 8. Rest for a few seconds and repeat Steps 1 through 7 at least 10 times every 1-2 hours when you are awake. Take your time and take a few normal breaths between deep breaths. 9. The spirometer may include an indicator to show your best effort. Use the indicator as a goal to work toward during each repetition. 10. After each set of 10 deep breaths, practice coughing to be sure your lungs are clear. If you have an incision (the cut made at the time of surgery), support your incision when coughing by placing a pillow or rolled up towels firmly against it. Once you are able to get out of bed, walk around indoors and cough well. You may stop using the incentive spirometer when instructed by your caregiver.  RISKS AND COMPLICATIONS  Take your time so you do not get  dizzy or light-headed.  If you are in pain, you may need to take or ask for pain medication before doing incentive spirometry. It is harder to take a deep breath if you are having pain. AFTER USE  Rest and breathe slowly and easily.  It can be helpful to  keep track of a log of your progress. Your caregiver can provide you with a simple table to help with this. If you are using the spirometer at home, follow these instructions: [place] IF:   You are having difficultly using the spirometer.  You have trouble using the spirometer as often as instructed.  Your pain medication is not giving enough relief while using the spirometer.  You develop fever of 100.5 F (38.1 C) or higher. SEEK IMMEDIATE MEDICAL CARE IF:   You cough up bloody sputum that had not been present before.  You develop fever of 102 F (38.9 C) or greater.  You develop worsening pain at or near the incision site. MAKE SURE YOU:   Understand these instructions.  Will watch your condition.  Will get help right away if you are not doing well or get worse. Document Released: 05/31/2006 Document Revised: 04/12/2011 Document Reviewed: 08/01/2006 [facility] Patient Information 2014 [place], [place].   ________________________________________________________________________

## 2017-09-28 ENCOUNTER — Encounter (HOSPITAL_COMMUNITY)
Admission: RE | Admit: 2017-09-28 | Discharge: 2017-09-28 | Disposition: A | Discharge status: Home / Self Care | Payer: Medicare Other | Source: Ambulatory Visit | Attending: Orthopedic Surgery | Admitting: Orthopedic Surgery

## 2017-09-28 ENCOUNTER — Other Ambulatory Visit: Payer: Self-pay

## 2017-09-28 ENCOUNTER — Encounter (HOSPITAL_COMMUNITY): Payer: Self-pay

## 2017-09-28 DIAGNOSIS — M1712 Unilateral primary osteoarthritis, left knee: Secondary | ICD-10-CM | POA: Diagnosis not present

## 2017-09-28 DIAGNOSIS — Z01812 Encounter for preprocedural laboratory examination: Secondary | ICD-10-CM | POA: Diagnosis not present

## 2017-09-28 HISTORY — DX: Gastro-esophageal reflux disease without esophagitis: K21.9

## 2017-09-28 LAB — SURGICAL PCR SCREEN
MRSA, PCR: NEGATIVE
STAPHYLOCOCCUS AUREUS: NEGATIVE

## 2017-09-28 LAB — CBC
HEMATOCRIT: 43.5 % (ref 36.0–46.0)
Hemoglobin: 14.7 g/dL (ref 12.0–15.0)
MCH: 30.9 pg (ref 26.0–34.0)
MCHC: 33.8 g/dL (ref 30.0–36.0)
MCV: 91.6 fL (ref 78.0–100.0)
PLATELETS: 249 10*3/uL (ref 150–400)
RBC: 4.75 MIL/uL (ref 3.87–5.11)
RDW: 13 % (ref 11.5–15.5)
WBC: 5.8 10*3/uL (ref 4.0–10.5)

## 2017-09-28 NOTE — Progress Notes (Addendum)
EKG-06/02/2017-epic  Echo-06/08/17-epic  LOV- card- DR nishan-06/02/17-epic

## 2017-09-30 NOTE — Progress Notes (Signed)
09/29/2017 - Office visit note on chart from Dr Jeanmarie Hubertob Ehinger realted to recent cough/congestion.  See PCP note on chart.

## 2017-10-03 MED ORDER — BUPIVACAINE LIPOSOME 1.3 % IJ SUSP
20.0000 mL | Freq: Once | INTRAMUSCULAR | Status: DC
  Filled 2017-10-03: qty 20

## 2017-10-04 ENCOUNTER — Encounter (HOSPITAL_COMMUNITY): Payer: Self-pay | Admitting: Certified Registered"

## 2017-10-04 ENCOUNTER — Inpatient Hospital Stay (HOSPITAL_COMMUNITY)
Admission: AD | Admit: 2017-10-04 | Discharge: 2017-10-05 | DRG: 469 | Disposition: A | Discharge status: Home Health | Payer: Medicare Other | Source: Ambulatory Visit | Attending: Orthopedic Surgery | Admitting: Orthopedic Surgery

## 2017-10-04 ENCOUNTER — Other Ambulatory Visit: Payer: Self-pay

## 2017-10-04 ENCOUNTER — Ambulatory Visit (HOSPITAL_COMMUNITY): Payer: Medicare Other | Admitting: Certified Registered"

## 2017-10-04 ENCOUNTER — Encounter (HOSPITAL_COMMUNITY)
Admission: AD | Disposition: A | Discharge status: Home Health | Payer: Self-pay | Source: Ambulatory Visit | Attending: Orthopedic Surgery

## 2017-10-04 ENCOUNTER — Inpatient Hospital Stay (HOSPITAL_COMMUNITY): Payer: Medicare Other

## 2017-10-04 DIAGNOSIS — I2510 Atherosclerotic heart disease of native coronary artery without angina pectoris: Secondary | ICD-10-CM | POA: Diagnosis present

## 2017-10-04 DIAGNOSIS — J449 Chronic obstructive pulmonary disease, unspecified: Secondary | ICD-10-CM | POA: Diagnosis present

## 2017-10-04 DIAGNOSIS — J9690 Respiratory failure, unspecified, unspecified whether with hypoxia or hypercapnia: Secondary | ICD-10-CM | POA: Diagnosis present

## 2017-10-04 DIAGNOSIS — I4891 Unspecified atrial fibrillation: Secondary | ICD-10-CM | POA: Diagnosis present

## 2017-10-04 DIAGNOSIS — G8929 Other chronic pain: Secondary | ICD-10-CM | POA: Diagnosis present

## 2017-10-04 DIAGNOSIS — M5126 Other intervertebral disc displacement, lumbar region: Secondary | ICD-10-CM | POA: Diagnosis present

## 2017-10-04 DIAGNOSIS — K769 Liver disease, unspecified: Secondary | ICD-10-CM | POA: Diagnosis present

## 2017-10-04 DIAGNOSIS — Z7982 Long term (current) use of aspirin: Secondary | ICD-10-CM

## 2017-10-04 DIAGNOSIS — Z981 Arthrodesis status: Secondary | ICD-10-CM | POA: Diagnosis not present

## 2017-10-04 DIAGNOSIS — E785 Hyperlipidemia, unspecified: Secondary | ICD-10-CM | POA: Diagnosis present

## 2017-10-04 DIAGNOSIS — E119 Type 2 diabetes mellitus without complications: Secondary | ICD-10-CM | POA: Diagnosis present

## 2017-10-04 DIAGNOSIS — Z91013 Allergy to seafood: Secondary | ICD-10-CM

## 2017-10-04 DIAGNOSIS — Z87442 Personal history of urinary calculi: Secondary | ICD-10-CM

## 2017-10-04 DIAGNOSIS — Z96659 Presence of unspecified artificial knee joint: Secondary | ICD-10-CM | POA: Diagnosis present

## 2017-10-04 DIAGNOSIS — I10 Essential (primary) hypertension: Secondary | ICD-10-CM | POA: Diagnosis present

## 2017-10-04 DIAGNOSIS — M25562 Pain in left knee: Secondary | ICD-10-CM | POA: Diagnosis present

## 2017-10-04 DIAGNOSIS — K219 Gastro-esophageal reflux disease without esophagitis: Secondary | ICD-10-CM | POA: Diagnosis present

## 2017-10-04 DIAGNOSIS — Z9889 Other specified postprocedural states: Secondary | ICD-10-CM

## 2017-10-04 DIAGNOSIS — Z8673 Personal history of transient ischemic attack (TIA), and cerebral infarction without residual deficits: Secondary | ICD-10-CM | POA: Diagnosis not present

## 2017-10-04 DIAGNOSIS — R569 Unspecified convulsions: Secondary | ICD-10-CM | POA: Diagnosis present

## 2017-10-04 DIAGNOSIS — Z7901 Long term (current) use of anticoagulants: Secondary | ICD-10-CM

## 2017-10-04 DIAGNOSIS — M1710 Unilateral primary osteoarthritis, unspecified knee: Secondary | ICD-10-CM | POA: Diagnosis present

## 2017-10-04 DIAGNOSIS — M1712 Unilateral primary osteoarthritis, left knee: Principal | ICD-10-CM | POA: Diagnosis present

## 2017-10-04 HISTORY — PX: PARTIAL KNEE ARTHROPLASTY: SHX2174

## 2017-10-04 LAB — BASIC METABOLIC PANEL
Anion gap: 11 (ref 5–15)
BUN: 24 mg/dL — AB (ref 8–23)
CALCIUM: 9.6 mg/dL (ref 8.9–10.3)
CO2: 26 mmol/L (ref 22–32)
CREATININE: 0.89 mg/dL (ref 0.44–1.00)
Chloride: 109 mmol/L (ref 98–111)
GFR calc non Af Amer: >60 mL/min (ref >60)
GLUCOSE: 115 mg/dL — AB (ref 70–99)
Potassium: 3.5 mmol/L (ref 3.5–5.1)
Sodium: 146 mmol/L — ABNORMAL HIGH (ref 135–145)

## 2017-10-04 LAB — URINALYSIS, ROUTINE W REFLEX MICROSCOPIC
Bilirubin Urine: NEGATIVE
GLUCOSE, UA: NEGATIVE mg/dL
Hgb urine dipstick: NEGATIVE
Ketones, ur: 5 mg/dL — AB
Leukocytes, UA: NEGATIVE
NITRITE: NEGATIVE
PH: 5 (ref 5.0–8.0)
Protein, ur: 100 mg/dL — AB
SPECIFIC GRAVITY, URINE: 1.039 — AB (ref 1.005–1.030)

## 2017-10-04 SURGERY — ARTHROPLASTY, KNEE, UNICOMPARTMENTAL
Anesthesia: Regional | Site: Knee | Laterality: Left

## 2017-10-04 MED ORDER — PHENOL 1.4 % MT LIQD
1.0000 | OROMUCOSAL | Status: DC | PRN
  Filled 2017-10-04: qty 177

## 2017-10-04 MED ORDER — BUPIVACAINE LIPOSOME 1.3 % IJ SUSP
INTRAMUSCULAR | Status: DC | PRN
  Administered 2017-10-04: 20 mL

## 2017-10-04 MED ORDER — ONDANSETRON HCL 4 MG/2ML IJ SOLN: 4.0000 mg | Freq: Four times a day (QID) | INTRAMUSCULAR | Status: DC | PRN

## 2017-10-04 MED ORDER — FENTANYL CITRATE (PF) 100 MCG/2ML IJ SOLN
INTRAMUSCULAR | Status: AC
  Filled 2017-10-04: qty 2

## 2017-10-04 MED ORDER — ACETAMINOPHEN 500 MG PO TABS
1000.0000 mg | ORAL_TABLET | Freq: Once | ORAL | Status: AC
  Administered 2017-10-04: 1000 mg via ORAL
  Filled 2017-10-04: qty 2

## 2017-10-04 MED ORDER — ONDANSETRON HCL 4 MG/2ML IJ SOLN
INTRAMUSCULAR | Status: AC
  Filled 2017-10-04: qty 2

## 2017-10-04 MED ORDER — OXYCODONE HCL 5 MG PO TABS
5.0000 mg | ORAL_TABLET | ORAL | Status: DC | PRN
  Administered 2017-10-04 – 2017-10-05 (×3): 5 mg via ORAL
  Filled 2017-10-04: qty 1
  Filled 2017-10-04: qty 2
  Filled 2017-10-04 (×2): qty 1

## 2017-10-04 MED ORDER — DEXAMETHASONE SODIUM PHOSPHATE 10 MG/ML IJ SOLN
INTRAMUSCULAR | Status: AC
  Filled 2017-10-04: qty 1

## 2017-10-04 MED ORDER — LACTATED RINGERS IV SOLN
INTRAVENOUS | Status: DC
  Administered 2017-10-04 (×2): via INTRAVENOUS

## 2017-10-04 MED ORDER — SIMVASTATIN 20 MG PO TABS
40.0000 mg | ORAL_TABLET | Freq: Every day | ORAL | Status: DC
  Administered 2017-10-04: 40 mg via ORAL
  Filled 2017-10-04: qty 2

## 2017-10-04 MED ORDER — ACETAMINOPHEN 500 MG PO TABS
1000.0000 mg | ORAL_TABLET | Freq: Three times a day (TID) | ORAL | Status: DC
  Administered 2017-10-04 – 2017-10-05 (×3): 1000 mg via ORAL
  Filled 2017-10-04 (×3): qty 2

## 2017-10-04 MED ORDER — OXYCODONE-ACETAMINOPHEN 5-325 MG PO TABS: 1.0000 | ORAL_TABLET | ORAL | 0 refills | Status: DC | PRN

## 2017-10-04 MED ORDER — DEXAMETHASONE SODIUM PHOSPHATE 10 MG/ML IJ SOLN
10.0000 mg | Freq: Once | INTRAMUSCULAR | Status: AC
  Administered 2017-10-05: 10 mg via INTRAVENOUS
  Filled 2017-10-04: qty 1

## 2017-10-04 MED ORDER — FENTANYL CITRATE (PF) 100 MCG/2ML IJ SOLN
25.0000 ug | INTRAMUSCULAR | Status: DC | PRN
  Administered 2017-10-04 (×2): 50 ug via INTRAVENOUS

## 2017-10-04 MED ORDER — LISINOPRIL-HYDROCHLOROTHIAZIDE 20-12.5 MG PO TABS: 1.0000 | ORAL_TABLET | Freq: Every day | ORAL | Status: DC

## 2017-10-04 MED ORDER — ONDANSETRON HCL 4 MG/2ML IJ SOLN
INTRAMUSCULAR | Status: DC | PRN
  Administered 2017-10-04: 4 mg via INTRAVENOUS

## 2017-10-04 MED ORDER — PROPOFOL 10 MG/ML IV BOLUS
INTRAVENOUS | Status: AC
  Filled 2017-10-04: qty 40

## 2017-10-04 MED ORDER — FENTANYL CITRATE (PF) 100 MCG/2ML IJ SOLN
INTRAMUSCULAR | Status: AC
  Filled 2017-10-04: qty 4

## 2017-10-04 MED ORDER — GABAPENTIN 100 MG PO CAPS: 100.0000 mg | ORAL_CAPSULE | Freq: Three times a day (TID) | ORAL | 2 refills | Status: DC

## 2017-10-04 MED ORDER — ATENOLOL 50 MG PO TABS
50.0000 mg | ORAL_TABLET | Freq: Every day | ORAL | Status: DC
  Administered 2017-10-05: 50 mg via ORAL
  Filled 2017-10-04: qty 1

## 2017-10-04 MED ORDER — PROPOFOL 10 MG/ML IV BOLUS
INTRAVENOUS | Status: DC | PRN
  Administered 2017-10-04: 180 mg via INTRAVENOUS

## 2017-10-04 MED ORDER — HYDROCHLOROTHIAZIDE 12.5 MG PO CAPS
12.5000 mg | ORAL_CAPSULE | Freq: Every day | ORAL | Status: DC
  Administered 2017-10-05: 12.5 mg via ORAL
  Filled 2017-10-04: qty 1

## 2017-10-04 MED ORDER — ONDANSETRON HCL 4 MG/2ML IJ SOLN
4.0000 mg | Freq: Once | INTRAMUSCULAR | Status: AC | PRN
  Administered 2017-10-04: 4 mg via INTRAVENOUS

## 2017-10-04 MED ORDER — FENTANYL CITRATE (PF) 100 MCG/2ML IJ SOLN
100.0000 ug | Freq: Once | INTRAMUSCULAR | Status: AC
  Administered 2017-10-04: 50 ug via INTRAVENOUS
  Filled 2017-10-04: qty 2

## 2017-10-04 MED ORDER — PANTOPRAZOLE SODIUM 40 MG PO TBEC
40.0000 mg | DELAYED_RELEASE_TABLET | Freq: Every day | ORAL | Status: DC
  Administered 2017-10-04: 40 mg via ORAL
  Filled 2017-10-04: qty 1

## 2017-10-04 MED ORDER — METHOCARBAMOL 500 MG PO TABS
500.0000 mg | ORAL_TABLET | Freq: Four times a day (QID) | ORAL | Status: DC | PRN
  Administered 2017-10-04 – 2017-10-05 (×2): 500 mg via ORAL
  Filled 2017-10-04 (×2): qty 1

## 2017-10-04 MED ORDER — 0.9 % SODIUM CHLORIDE (POUR BTL) OPTIME
TOPICAL | Status: DC | PRN
  Administered 2017-10-04: 1000 mL

## 2017-10-04 MED ORDER — MENTHOL 3 MG MT LOZG: 1.0000 | LOZENGE | OROMUCOSAL | Status: DC | PRN

## 2017-10-04 MED ORDER — SODIUM CHLORIDE FLUSH 0.9 % IV SOLN
INTRAVENOUS | Status: DC | PRN
  Administered 2017-10-04: 30 mL

## 2017-10-04 MED ORDER — TRANEXAMIC ACID 1000 MG/10ML IV SOLN
1000.0000 mg | INTRAVENOUS | Status: AC
  Administered 2017-10-04: 1000 mg via INTRAVENOUS
  Filled 2017-10-04: qty 10

## 2017-10-04 MED ORDER — POLYETHYLENE GLYCOL 3350 17 G PO PACK: 17.0000 g | PACK | Freq: Every day | ORAL | Status: DC | PRN

## 2017-10-04 MED ORDER — APIXABAN 5 MG PO TABS: 5.0000 mg | ORAL_TABLET | Freq: Two times a day (BID) | ORAL | Status: DC

## 2017-10-04 MED ORDER — PHENYLEPHRINE 40 MCG/ML (10ML) SYRINGE FOR IV PUSH (FOR BLOOD PRESSURE SUPPORT)
PREFILLED_SYRINGE | INTRAVENOUS | Status: AC
  Filled 2017-10-04: qty 10

## 2017-10-04 MED ORDER — LEVETIRACETAM 500 MG PO TABS
500.0000 mg | ORAL_TABLET | Freq: Two times a day (BID) | ORAL | Status: DC
  Administered 2017-10-04 – 2017-10-05 (×2): 500 mg via ORAL
  Filled 2017-10-04 (×2): qty 1

## 2017-10-04 MED ORDER — CEFAZOLIN SODIUM-DEXTROSE 2-4 GM/100ML-% IV SOLN
2.0000 g | INTRAVENOUS | Status: AC
  Administered 2017-10-04: 2 g via INTRAVENOUS
  Filled 2017-10-04: qty 100

## 2017-10-04 MED ORDER — LACTATED RINGERS IV SOLN
INTRAVENOUS | Status: DC
  Administered 2017-10-04 – 2017-10-05 (×2): via INTRAVENOUS

## 2017-10-04 MED ORDER — FENTANYL CITRATE (PF) 100 MCG/2ML IJ SOLN
INTRAMUSCULAR | Status: DC | PRN
  Administered 2017-10-04: 50 ug via INTRAVENOUS
  Administered 2017-10-04 (×2): 25 ug via INTRAVENOUS
  Administered 2017-10-04 (×2): 50 ug via INTRAVENOUS

## 2017-10-04 MED ORDER — CEFAZOLIN SODIUM-DEXTROSE 2-4 GM/100ML-% IV SOLN
2.0000 g | Freq: Four times a day (QID) | INTRAVENOUS | Status: AC
  Administered 2017-10-04 (×2): 2 g via INTRAVENOUS
  Filled 2017-10-04 (×2): qty 100

## 2017-10-04 MED ORDER — HYDROMORPHONE HCL 1 MG/ML IJ SOLN: 0.5000 mg | INTRAMUSCULAR | Status: DC | PRN

## 2017-10-04 MED ORDER — LISINOPRIL 20 MG PO TABS
20.0000 mg | ORAL_TABLET | Freq: Every day | ORAL | Status: DC
  Administered 2017-10-05: 20 mg via ORAL
  Filled 2017-10-04: qty 1

## 2017-10-04 MED ORDER — CHLORHEXIDINE GLUCONATE 4 % EX LIQD: 60.0000 mL | Freq: Once | CUTANEOUS | Status: DC

## 2017-10-04 MED ORDER — GABAPENTIN 300 MG PO CAPS
300.0000 mg | ORAL_CAPSULE | Freq: Three times a day (TID) | ORAL | Status: DC
  Administered 2017-10-04 – 2017-10-05 (×3): 300 mg via ORAL
  Filled 2017-10-04 (×3): qty 1

## 2017-10-04 MED ORDER — METHOCARBAMOL 500 MG IVPB - SIMPLE MED
INTRAVENOUS | Status: AC
  Filled 2017-10-04: qty 50

## 2017-10-04 MED ORDER — MIDAZOLAM HCL 2 MG/2ML IJ SOLN
2.0000 mg | Freq: Once | INTRAMUSCULAR | Status: AC
  Administered 2017-10-04: 1 mg via INTRAVENOUS
  Filled 2017-10-04: qty 2

## 2017-10-04 MED ORDER — CHLORHEXIDINE GLUCONATE 4 % EX LIQD
60.0000 mL | Freq: Once | CUTANEOUS | Status: AC
  Administered 2017-10-04: 4 via TOPICAL

## 2017-10-04 MED ORDER — SODIUM CHLORIDE 0.9 % IR SOLN
Status: DC | PRN
  Administered 2017-10-04: 1000 mL

## 2017-10-04 MED ORDER — GABAPENTIN 300 MG PO CAPS
300.0000 mg | ORAL_CAPSULE | Freq: Once | ORAL | Status: AC
  Administered 2017-10-04: 300 mg via ORAL
  Filled 2017-10-04: qty 1

## 2017-10-04 MED ORDER — DOCUSATE SODIUM 100 MG PO CAPS
100.0000 mg | ORAL_CAPSULE | Freq: Two times a day (BID) | ORAL | Status: DC
  Administered 2017-10-04 – 2017-10-05 (×2): 100 mg via ORAL
  Filled 2017-10-04 (×2): qty 1

## 2017-10-04 MED ORDER — LIDOCAINE 2% (20 MG/ML) 5 ML SYRINGE
INTRAMUSCULAR | Status: DC | PRN
  Administered 2017-10-04: 100 mg via INTRAVENOUS

## 2017-10-04 MED ORDER — ACETAMINOPHEN 325 MG PO TABS: 325.0000 mg | ORAL_TABLET | Freq: Four times a day (QID) | ORAL | Status: DC | PRN

## 2017-10-04 MED ORDER — STERILE WATER FOR IRRIGATION IR SOLN
Status: DC | PRN
  Administered 2017-10-04: 1000 mL

## 2017-10-04 MED ORDER — DIPHENHYDRAMINE HCL 12.5 MG/5ML PO ELIX: 12.5000 mg | ORAL_SOLUTION | ORAL | Status: DC | PRN

## 2017-10-04 MED ORDER — DEXAMETHASONE SODIUM PHOSPHATE 10 MG/ML IJ SOLN
INTRAMUSCULAR | Status: DC | PRN
  Administered 2017-10-04: 10 mg via INTRAVENOUS

## 2017-10-04 MED ORDER — SORBITOL 70 % SOLN
30.0000 mL | Freq: Every day | Status: DC | PRN
  Filled 2017-10-04: qty 30

## 2017-10-04 MED ORDER — PHENYLEPHRINE 40 MCG/ML (10ML) SYRINGE FOR IV PUSH (FOR BLOOD PRESSURE SUPPORT)
PREFILLED_SYRINGE | INTRAVENOUS | Status: DC | PRN
  Administered 2017-10-04 (×3): 80 ug via INTRAVENOUS

## 2017-10-04 MED ORDER — MAGNESIUM CITRATE PO SOLN: 1.0000 | Freq: Once | ORAL | Status: DC | PRN

## 2017-10-04 MED ORDER — METHOCARBAMOL 500 MG IVPB - SIMPLE MED
500.0000 mg | Freq: Four times a day (QID) | INTRAVENOUS | Status: DC | PRN
  Filled 2017-10-04: qty 50

## 2017-10-04 MED ORDER — ONDANSETRON HCL 4 MG PO TABS: 4.0000 mg | ORAL_TABLET | Freq: Four times a day (QID) | ORAL | Status: DC | PRN

## 2017-10-04 MED ORDER — LIDOCAINE 2% (20 MG/ML) 5 ML SYRINGE
INTRAMUSCULAR | Status: AC
  Filled 2017-10-04: qty 5

## 2017-10-04 MED ORDER — ROPIVACAINE HCL 7.5 MG/ML IJ SOLN
INTRAMUSCULAR | Status: DC | PRN
  Administered 2017-10-04: 20 mL via PERINEURAL

## 2017-10-04 MED ORDER — SODIUM CHLORIDE 0.9 % IJ SOLN
INTRAMUSCULAR | Status: AC
  Filled 2017-10-04: qty 50

## 2017-10-04 MED ORDER — APIXABAN 5 MG PO TABS
5.0000 mg | ORAL_TABLET | Freq: Two times a day (BID) | ORAL | Status: DC
  Administered 2017-10-05: 5 mg via ORAL
  Filled 2017-10-04: qty 1

## 2017-10-04 MED ORDER — METOCLOPRAMIDE HCL 5 MG PO TABS: 5.0000 mg | ORAL_TABLET | Freq: Three times a day (TID) | ORAL | Status: DC | PRN

## 2017-10-04 MED ORDER — METOCLOPRAMIDE HCL 5 MG/ML IJ SOLN: 5.0000 mg | Freq: Three times a day (TID) | INTRAMUSCULAR | Status: DC | PRN

## 2017-10-04 SURGICAL SUPPLY — 59 items
BEARING MENISCAL TIBIAL 5 SM L (Knees) ×2 IMPLANT
BIT DRILL QUICK REL 1/8 2PK SL (DRILL) ×1 IMPLANT
BLADE SAW RECIPROCATING 77.5 (BLADE) ×2 IMPLANT
BLADE SAW SAG 90X13X1.27 (BLADE) ×2 IMPLANT
BONE CEMENT PALACOSE (Cement) ×2 IMPLANT
BOWL SMART MIX CTS (DISPOSABLE) ×2 IMPLANT
CEMENT BONE PALACOSE (Cement) ×1 IMPLANT
CHLORAPREP W/TINT 26ML (MISCELLANEOUS) ×4 IMPLANT
COVER SURGICAL LIGHT HANDLE (MISCELLANEOUS) ×2 IMPLANT
CUFF TOURN SGL QUICK 34 (TOURNIQUET CUFF) ×1
CUFF TRNQT CYL 34X4X40X1 (TOURNIQUET CUFF) ×1 IMPLANT
DRAPE ARTHROSCOPY W/POUCH 114 (DRAPES) ×2 IMPLANT
DRAPE INCISE 23X17 IOBAN STRL (DRAPES) ×1
DRAPE INCISE IOBAN 23X17 STRL (DRAPES) ×1 IMPLANT
DRAPE U-SHAPE 47X51 STRL (DRAPES) ×2 IMPLANT
DRILL QUICK RELEASE 1/8 INCH (DRILL) ×1
DRSG MEPILEX BORDER 4X4 (GAUZE/BANDAGES/DRESSINGS) IMPLANT
DRSG MEPILEX BORDER 4X8 (GAUZE/BANDAGES/DRESSINGS) ×2 IMPLANT
ELECT REM PT RETURN 15FT ADLT (MISCELLANEOUS) ×2 IMPLANT
GLOVE BIO SURGEON STRL SZ 6.5 (GLOVE) ×2 IMPLANT
GLOVE BIO SURGEON STRL SZ7.5 (GLOVE) ×4 IMPLANT
GLOVE BIOGEL PI IND STRL 7.5 (GLOVE) ×3 IMPLANT
GLOVE BIOGEL PI IND STRL 8 (GLOVE) ×2 IMPLANT
GLOVE BIOGEL PI IND STRL 8.5 (GLOVE) ×1 IMPLANT
GLOVE BIOGEL PI INDICATOR 7.5 (GLOVE) ×3
GLOVE BIOGEL PI INDICATOR 8 (GLOVE) ×2
GLOVE BIOGEL PI INDICATOR 8.5 (GLOVE) ×1
GLOVE ECLIPSE 6.5 STRL STRAW (GLOVE) ×2 IMPLANT
GLOVE INDICATOR 6.5 STRL GRN (GLOVE) ×2 IMPLANT
GLOVE SURG SS PI 6.5 STRL IVOR (GLOVE) ×2 IMPLANT
GLOVE SURG SS PI 7.5 STRL IVOR (GLOVE) ×2 IMPLANT
GOWN SPEC L3 XXLG W/TWL (GOWN DISPOSABLE) ×2 IMPLANT
GOWN STRL REUS W/ TWL LRG LVL3 (GOWN DISPOSABLE) ×2 IMPLANT
GOWN STRL REUS W/ TWL XL LVL3 (GOWN DISPOSABLE) ×1 IMPLANT
GOWN STRL REUS W/TWL LRG LVL3 (GOWN DISPOSABLE) ×2
GOWN STRL REUS W/TWL XL LVL3 (GOWN DISPOSABLE) ×1
HANDPIECE INTERPULSE COAX TIP (DISPOSABLE) ×1
IMMOBILIZER KNEE 22 UNIV (SOFTGOODS) ×2 IMPLANT
INSERT TIBIAL OXFORD SZ B LF (Joint) ×2 IMPLANT
MANIFOLD NEPTUNE II (INSTRUMENTS) ×2 IMPLANT
NDL SAFETY ECLIPSE 18X1.5 (NEEDLE) ×1 IMPLANT
NEEDLE HYPO 18GX1.5 SHARP (NEEDLE) ×1
PACK BLADE SAW RECIP 70 3 PT (BLADE) ×2 IMPLANT
PACK ICE MAXI GEL EZY WRAP (MISCELLANEOUS) ×2 IMPLANT
PACK TOTAL JOINT (CUSTOM PROCEDURE TRAY) ×2 IMPLANT
PEG FEMORAL PEGGED STRL SM (Knees) ×2 IMPLANT
POSITIONER SURGICAL ARM (MISCELLANEOUS) ×2 IMPLANT
SET HNDPC FAN SPRY TIP SCT (DISPOSABLE) ×1 IMPLANT
STRIP CLOSURE SKIN 1/2X4 (GAUZE/BANDAGES/DRESSINGS) ×2 IMPLANT
SUCTION FRAZIER 12FR DISP (SUCTIONS) ×2 IMPLANT
SUT MNCRL AB 4-0 PS2 18 (SUTURE) ×2 IMPLANT
SUT MON AB 2-0 CT1 36 (SUTURE) ×2 IMPLANT
SUT VIC AB 0 CT1 27 (SUTURE) ×1
SUT VIC AB 0 CT1 27XBRD ANTBC (SUTURE) ×1 IMPLANT
SUT VIC AB 1 CT1 27 (SUTURE) ×2
SUT VIC AB 1 CT1 27XBRD ANTBC (SUTURE) ×2 IMPLANT
SYR 50ML LL SCALE MARK (SYRINGE) ×2 IMPLANT
TRAY FOLEY CATH 16FRSI W/METER (SET/KITS/TRAYS/PACK) ×2 IMPLANT
YANKAUER SUCT BULB TIP 10FT TU (MISCELLANEOUS) ×2 IMPLANT

## 2017-10-04 NOTE — Anesthesia Procedure Notes (Addendum)
Anesthesia Regional Block: Adductor canal block   Pre-Anesthetic Checklist: ,, timeout performed, Correct Patient, Correct Site, Correct Laterality, Correct Procedure,, site marked, risks and benefits discussed, Surgical consent,  Pre-op evaluation,  At surgeon's request and post-op pain management  Laterality: Left  Prep: chloraprep       Needles:  Injection technique: Single-shot  Needle Type: Echogenic Stimulator Needle     Needle Length: 10cm  Needle Gauge: 21     Additional Needles:   Procedures:,,,, ultrasound used (permanent image in chart),,,,  Narrative:  Start time: 10/04/2017 9:40 AM End time: 10/04/2017 9:50 AM Injection made incrementally with aspirations every 5 mL.  Performed by: Personally  Anesthesiologist: Leonides Grills, MD  Additional Notes: Functioning IV was confirmed and monitors were applied. A time-out was performed. Hand hygiene and sterile gloves were used. The thigh was placed in a frog-leg position and prepped in a sterile fashion. A 21ga [place] echogenic stimulator needle was placed using ultrasound guidance.  Negative aspiration and negative test dose prior to incremental administration of local anesthetic. The patient tolerated the procedure well.

## 2017-10-04 NOTE — Op Note (Signed)
10/04/2017  12:09 PM  PATIENT:  Carol Gamble    PRE-OPERATIVE DIAGNOSIS:  OA LEFT KNEE  POST-OPERATIVE DIAGNOSIS:  Same  PROCEDURE:  UNICOMPARTMENTAL LEFT  KNEE  SURGEON:  Sheral Apley, MD  PHYSICIAN ASSISTANT: none  ANESTHESIA:   General  PREOPERATIVE INDICATIONS:  Carol Gamble is a  77 y.o. female with a diagnosis of OA LEFT KNEE who failed conservative measures and elected for surgical management.    The risks benefits and alternatives were discussed with the patient preoperatively including but not limited to the risks of infection, bleeding, nerve injury, cardiopulmonary complications, blood clots, the need for revision surgery, among others, and the patient was willing to proceed.  OPERATIVE IMPLANTS: Biomet [place] mobile bearing medial compartment arthroplasty. Femoral Component: small. Tibial tray: B, Size 5 poly.   OPERATIVE FINDINGS: Endstage grade 4 medial compartment osteoarthritis. No significant changes in the lateral or patellofemoral joint  OPERATIVE PROCEDURE: The patient was brought to the operating room placed in supine position. General anesthesia was administered. IV antibiotics were given. The lower extremity was placed in the legholder and prepped and draped in usual sterile fashion.  Time out was performed.  The leg was elevated and exsanguinated and the tourniquet was inflated. Anteromedial incision was performed, and I took care to preserve the MCL. Parapatellar incision was carried out, and the osteophytes were excised, along with the medial meniscus and a small portion of the fat pad.  The extra medullary tibial cutting jig was applied, using the spoon and the 4mm G-Clamp, and I took care to protect the anterior cruciate ligament insertion and the tibial spine. The medial collateral ligament was also protected, and I resected my proximal tibia, matching the anatomic slope.   The proximal tibial bony cut was removed in one piece, and I turned my  attention to the femur.  The intramedullary femoral rod was placed using the drill, and then using the appropriate reference, I assembled the femoral jig, setting my posterior cutting block. I resected my posterior femur, and then measured my gap.   I then used the mill to match the extension gap to the flexion gap. The gaps were then measured again with the appropriate feeler gauges. Once I had balanced flexion and extension gaps, I then completed the preparation of the femur.  I milled off the anterior aspect of the distal femur to prevent impingement. I also exposed the tibia, and selected the above-named component, and then used the cutting jig to prepare the keel slot on the tibia. I also used the awl to curette out the bone to complete the preparation of the keel. The back wall was intact.  I then placed trial components, and it was found to have excellent motion, and appropriate balance.  I then cemented the components into place, cementing the tibia first, removing all excess cement, and then cementing the femur.  All loose cement was removed.  The real polyethylene insert was applied manually, and the knee was taken through functional range of motion, and found to have excellent stability and restoration of joint motion, with excellent balance.  The wounds were irrigated copiously, and the parapatellar tissue closed with Vicryl, followed by Vicryl for the subcutaneous tissue, with routine closure with Steri-Strips and sterile gauze.  The tourniquet was released, and the patient was awakened and extubated and returned to PACU in stable and satisfactory condition. There were no complications.  POSTOPERATIVE PLAN: DVT px will consist of SCD's, mobiliation and chemical px, WBAT  Sheral Apley, MD

## 2017-10-04 NOTE — Evaluation (Signed)
Physical Therapy Evaluation Patient Details Name: Carol Gamble MRN: [id number] DOB: 1940/06/04 Today's Date: 10/04/2017   History of Present Illness  77 YO female s/p L UKR on 10/04/17. PMH includes HNP and multiple back surgeries, headaches, asthma, HTN, seizures, SOB, RTC repair.   Clinical Impression   Pt s/p L UKR on 10/04/17. Pt presents with very little L knee pain, decreased activity tolerance, fatigue with ambulation, and difficulty with mobility tasks. Pt to benefit from continued acute PT while in the hospital. Pt ambulated 40 ft with min guard assist with RW today, with no complaints of L knee pain. Will continue to follow pt acutely, and progress mobility as able.     Follow Up Recommendations Follow surgeon's recommendation for DC plan and follow-up therapies;Supervision for mobility/OOB(HHPT)    Equipment Recommendations  None recommended by PT    Recommendations for Other Services       Precautions / Restrictions Precautions Precautions: Fall Restrictions Weight Bearing Restrictions: No Other Position/Activity Restrictions: WBAT       Mobility  Bed Mobility Overal bed mobility: Needs Assistance Bed Mobility: Supine to Sit     Supine to sit: Min assist;Min guard     General bed mobility comments: Min assist for bringing LLE to EOB. Otherwise min guard for safety. Pt stating mild headache at start of session, and pt had tylenol but it didn't get rid of it. No other complaints of headache with mobility.   Transfers Overall transfer level: Needs assistance Equipment used: Rolling walker (2 wheeled) Transfers: Sit to/from Stand Sit to Stand: Min assist         General transfer comment: Min assist for steadying upon standing and during weight shifting L and R while standing. Verbal cuing for hand placement.   Ambulation/Gait Ambulation/Gait assistance: Min guard Gait Distance (Feet): 40 Feet Assistive device: Rolling walker (2 wheeled) Gait  Pattern/deviations: Step-to pattern;Decreased stance time - left;Decreased weight shift to left;Step-through pattern;Antalgic Gait velocity: decr   General Gait Details: Min guard for safety, verbal cuing for sequencing RW.   Stairs            Wheelchair Mobility    Modified Rankin (Stroke Patients Only)       Balance Overall balance assessment: Mild deficits observed, not formally tested                                           Pertinent Vitals/Pain Pain Assessment: 0-10 Pain Score: 0-No pain Pain Intervention(s): Monitored during session    Home Living Family/patient expects to be discharged to:: Private residence Living Arrangements: Spouse/significant other Available Help at Discharge: Family;Available 24 hours/day Type of Home: House Home Access: Stairs to enter Entrance Stairs-Rails: Right;Left;Can reach both Entrance Stairs-Number of Steps: 3 in the front, 5 in the back  Home Layout: One level Home Equipment: Cane - single point;Walker - 2 wheels;Shower seat      Prior Function Level of Independence: Needs assistance   Gait / Transfers Assistance Needed: used cane all the time   ADL's / Homemaking Assistance Needed: Not able to cook when "knee got so bad"         Hand Dominance   Dominant Hand: Right    Extremity/Trunk Assessment   Upper Extremity Assessment Upper Extremity Assessment: Overall WFL for tasks assessed    Lower Extremity Assessment Lower Extremity Assessment: LLE deficits/detail LLE Deficits / Details:  suspected post-surgical LLE weakness; able to perform SLR with active assist x2 during bed mobility, quad sets x10, ankle pumps x20  LLE Sensation: WNL    Cervical / Trunk Assessment Cervical / Trunk Assessment: Normal  Communication   Communication: No difficulties  Cognition Arousal/Alertness: Awake/alert;Lethargic Behavior During Therapy: WFL for tasks assessed/performed Overall Cognitive Status: Within  Functional Limits for tasks assessed                                        General Comments      Exercises General Exercises - Lower Extremity Ankle Circles/Pumps: AROM;Both;20 reps;Supine Quad Sets: AROM;Left;10 reps;Supine Heel Slides: AROM;Left;Supine(Only completed 1 rep after ambulation, pt too tired to do more)   Assessment/Plan    PT Assessment Patient needs continued PT services  PT Problem List Decreased strength;Decreased activity tolerance;Decreased knowledge of use of DME;Decreased balance;Decreased safety awareness;Decreased mobility;Decreased range of motion       PT Treatment Interventions DME instruction;Therapeutic activities;Gait training;Therapeutic exercise;Patient/family education;Stair training;Balance training;Functional mobility training    PT Goals (Current goals can be found in the Care Plan section)  Acute Rehab PT Goals PT Goal Formulation: With patient Time For Goal Achievement: 10/18/17 Potential to Achieve Goals: Good    Frequency 7X/week   Barriers to discharge        Co-evaluation               AM-PAC PT "6 Clicks" Daily Activity  Outcome Measure Difficulty turning over in bed (including adjusting bedclothes, sheets and blankets)?: Unable Difficulty moving from lying on back to sitting on the side of the bed? : Unable Difficulty sitting down on and standing up from a chair with arms (e.g., wheelchair, bedside commode, etc,.)?: Unable Help needed moving to and from a bed to chair (including a wheelchair)?: None Help needed walking in hospital room?: A Little Help needed climbing 3-5 steps with a railing? : A Lot 6 Click Score: 12    End of Session Equipment Utilized During Treatment: Gait belt Activity Tolerance: Patient tolerated treatment well;No increased pain;Patient limited by fatigue Patient left: in chair;with chair alarm set;with call bell/phone within reach Nurse Communication: Mobility status PT Visit  Diagnosis: Other abnormalities of gait and mobility (R26.89);Difficulty in walking, not elsewhere classified (R26.2)    Time: 1850-1920 PT Time Calculation (min) (ACUTE ONLY): 30 min   Charges:   PT Evaluation $PT Eval Low Complexity: 1 Low PT Treatments $Gait Training: 8-22 mins       Graceanna Theissen Terrial Rhodes, PT, DPT  Pager # [phone]    Aishi Courts D Rebbecca Osuna 10/04/2017, 7:39 PM

## 2017-10-04 NOTE — Interval H&P Note (Signed)
History and Physical Interval Note:  10/04/2017 9:42 AM  Carol Gamble  has presented today for surgery, with the diagnosis of OA LEFT KNEE  The various methods of treatment have been discussed with the patient and family. After consideration of risks, benefits and other options for treatment, the patient has consented to  Procedure(s): UNICOMPARTMENTAL LEFT  KNEE (Left) as a surgical intervention .  The patient's history has been reviewed, patient examined, no change in status, stable for surgery.  I have reviewed the patient's chart and labs.  Questions were answered to the patient's satisfaction.     Sheral Apley

## 2017-10-04 NOTE — Transfer of Care (Signed)
Immediate Anesthesia Transfer of Care Note  Patient: Carol Gamble  Procedure(s) Performed: UNICOMPARTMENTAL LEFT  KNEE (Left Knee)  Patient Location: PACU  Anesthesia Type:General  Level of Consciousness: awake, alert  and oriented  Airway & Oxygen Therapy: Patient Spontanous Breathing and Patient connected to face mask oxygen  Post-op Assessment: Report given to RN and Post -op Vital signs reviewed and stable  Post vital signs: Reviewed and stable  Last Vitals:  Vitals Value Taken Time  BP 133/97 10/04/2017 12:51 PM  Temp    Pulse 79 10/04/2017 12:54 PM  Resp 7 10/04/2017 12:54 PM  SpO2 98 % 10/04/2017 12:54 PM  Vitals shown include unvalidated device data.  Last Pain:  Vitals:   10/04/17 0921  TempSrc: Oral         Complications: No apparent anesthesia complications

## 2017-10-04 NOTE — Progress Notes (Signed)
Assisted Dr. Bradley Ferris with left adductor canal  block. Side rails up, monitors on throughout procedure. See vital signs in flow sheet. Tolerated Procedure well.

## 2017-10-04 NOTE — Anesthesia Procedure Notes (Signed)
Procedure Name: LMA Insertion Date/Time: 10/04/2017 10:45 AM Performed by: Macala Baldonado D, CRNA Pre-anesthesia Checklist: Patient identified, Emergency Drugs available, Suction available and Patient being monitored Patient Re-evaluated:Patient Re-evaluated prior to induction Oxygen Delivery Method: Circle system utilized Preoxygenation: Pre-oxygenation with 100% oxygen Induction Type: IV induction Ventilation: Mask ventilation without difficulty LMA: LMA with gastric port inserted LMA Size: 4.0 Tube type: Oral Number of attempts: 1 Placement Confirmation: ETT inserted through vocal cords under direct vision,  positive ETCO2 and breath sounds checked- equal and bilateral Tube secured with: Tape Dental Injury: Teeth and Oropharynx as per pre-operative assessment

## 2017-10-04 NOTE — Anesthesia Preprocedure Evaluation (Addendum)
Anesthesia Evaluation  Patient identified by MRN, date of birth, ID band Patient awake    Reviewed: Allergy & Precautions, NPO status , Patient's Chart, lab work & pertinent test results  History of Anesthesia Complications (+) PONV and history of anesthetic complications  Airway Mallampati: II  TM Distance: >3 FB Neck ROM: Full    Dental  (+) Partial Lower, Upper Dentures   Pulmonary neg pulmonary ROS,    Pulmonary exam normal breath sounds clear to auscultation       Cardiovascular hypertension, Pt. on home beta blockers and Pt. on medications Normal cardiovascular exam+ dysrhythmias Atrial Fibrillation  Rhythm:Regular Rate:Normal  ECG: A-fib, rate 83  ECHO: LV EF: 55% - 60%  Medical Clearance by Dr. Manus Gunning                   Cardiac Clearance by Dr. Eden Emms Additional clearance by Beverely Low, NP     Neuro/Psych  Headaches, Seizures -, Well Controlled,  TIAnegative psych ROS   GI/Hepatic Neg liver ROS, GERD  Medicated and Controlled,  Endo/Other  negative endocrine ROS  Renal/GU negative Renal ROS     Musculoskeletal  (+) Arthritis , Osteoarthritis,  Lumbar spine surgery x 4   Abdominal (+) + obese,   Peds  Hematology HLD   Anesthesia Other Findings OA LEFT KNEE  Reproductive/Obstetrics                           Anesthesia Physical Anesthesia Plan  ASA: III  Anesthesia Plan: General and Regional   Post-op Pain Management: GA combined w/ Regional for post-op pain   Induction: Intravenous  PONV Risk Score and Plan: 4 or greater and Ondansetron, Dexamethasone, Midazolam and Treatment may vary due to age or medical condition  Airway Management Planned: LMA  Additional Equipment:   Intra-op Plan:   Post-operative Plan: Extubation in OR  Informed Consent: I have reviewed the patients History and Physical, chart, labs and discussed the procedure including the risks, benefits  and alternatives for the proposed anesthesia with the patient or authorized representative who has indicated his/her understanding and acceptance.   Dental advisory given  Plan Discussed with: CRNA  Anesthesia Plan Comments: [place])       Anesthesia Quick Evaluation

## 2017-10-04 NOTE — Plan of Care (Signed)
  Problem: Health Behavior/Discharge Planning: Goal: Ability to manage health-related needs will improve Outcome: Progressing   Problem: Clinical Measurements: Goal: Ability to maintain clinical measurements within normal limits will improve Outcome: Progressing   Problem: Clinical Measurements: Goal: Cardiovascular complication will be avoided Outcome: Progressing   Problem: Education: Goal: Knowledge of General Education information will improve Description Including pain rating scale, medication(s)/side effects and non-pharmacologic comfort measures Outcome: Progressing

## 2017-10-04 NOTE — Anesthesia Postprocedure Evaluation (Signed)
Anesthesia Post Note  Patient: Carol Gamble  Procedure(s) Performed: UNICOMPARTMENTAL LEFT  KNEE (Left Knee)     Patient location during evaluation: PACU Anesthesia Type: Regional and General Level of consciousness: awake and alert Pain management: pain level controlled Vital Signs Assessment: post-procedure vital signs reviewed and stable Respiratory status: spontaneous breathing, nonlabored ventilation, respiratory function stable and patient connected to nasal cannula oxygen Cardiovascular status: blood pressure returned to baseline and stable Postop Assessment: no apparent nausea or vomiting Anesthetic complications: no    Last Vitals:  Vitals:   10/04/17 1730 10/04/17 1840  BP: (!) 152/114 (!) 144/104  Pulse: 98 87  Resp: 16 15  Temp: (!) 36.4 C   SpO2: 100% 100%    Last Pain:  Vitals:   10/04/17 2011  TempSrc:   PainSc: 4                  [name] P Lori Popowski

## 2017-10-05 ENCOUNTER — Encounter (HOSPITAL_COMMUNITY): Payer: Self-pay | Admitting: Orthopedic Surgery

## 2017-10-05 NOTE — Discharge Summary (Signed)
Physician Discharge Summary  Patient ID: Carol Gamble MRN: [id number] DOB/AGE: May 03, 1940 77 y.o.  Admit date: 10/04/2017 Discharge date: 10/05/2017  Admission Diagnoses:  Primary osteoarthritis of left knee  Discharge Diagnoses:  Principal Problem:   Primary osteoarthritis of left knee Active Problems:   HNP (herniated nucleus pulposus), lumbar   HTN (hypertension)   Dyslipidemia   Seizure (HCC)   History of lumbar spinal fusion   Primary osteoarthritis of knee   Status post unicompartmental knee replacement   Past Medical History:  Diagnosis Date  . Arthritis    HNP- low back   . Complication of anesthesia    pt. reports that when she wakes up she had a hard time catching her breath & the PACU team told her that her BP & pulse were not doing what it was suppose to do  . Dyslipidemia 02/27/2016  . GERD (gastroesophageal reflux disease)   . Headache(784.0)   . History of asthma    childhood, age 77  . History of kidney stones about 2008ish  . HNP (herniated nucleus pulposus), lumbar 06/30/2012  . Hypertension    states she always has palpitations   . PONV (postoperative nausea and vomiting)   . Seizure (HCC)   . Seizures (HCC) 02/2016  . Stroke-like symptoms 02/27/2016    Surgeries: Procedure(s): UNICOMPARTMENTAL LEFT  KNEE on 10/04/2017   Consultants (if any):   Discharged Condition: Improved  Hospital Course: Carol Gamble is an 77 y.o. female who was admitted 10/04/2017 with a diagnosis of Primary osteoarthritis of left knee and went to the operating room on 10/04/2017 and underwent the above named procedures.    She was given perioperative antibiotics:  Anti-infectives (From admission, onward)   Start     Dose/Rate Route Frequency Ordered Stop   10/04/17 1700  ceFAZolin (ANCEF) IVPB 2g/100 mL premix     2 g 200 mL/hr over 30 Minutes Intravenous Every 6 hours 10/04/17 1533 10/05/17 0020   10/04/17 0815  ceFAZolin (ANCEF) IVPB 2g/100 mL premix     2 g 200  mL/hr over 30 Minutes Intravenous On call to O.R. 10/04/17 0809 10/04/17 1047    .  She was given sequential compression devices, early ambulation, and eliquis for DVT prophylaxis.  She benefited maximally from the hospital stay and there were no complications.    Recent vital signs:  Vitals:   10/05/17 0059 10/05/17 0506  BP:  (!) 146/73  Pulse:  77  Resp:  20  Temp: (!) 97.4 F (36.3 C) (!) 97.4 F (36.3 C)  SpO2:  100%    Recent laboratory studies:  Lab Results  Component Value Date   HGB 14.7 09/28/2017   HGB 14.3 02/27/2016   HGB 13.5 03/01/2013   Lab Results  Component Value Date   WBC 5.8 09/28/2017   PLT 249 09/28/2017   No results found for: INR Lab Results  Component Value Date   NA 146 (H) 10/04/2017   K 3.5 10/04/2017   CL 109 10/04/2017   CO2 26 10/04/2017   BUN 24 (H) 10/04/2017   CREATININE 0.89 10/04/2017   GLUCOSE 115 (H) 10/04/2017    Discharge Medications:     Diagnostic Studies: Dg Knee 1-2 Views Left  Result Date: 10/04/2017 CLINICAL DATA:  Postop knee. EXAM: LEFT KNEE - 1-2 VIEW COMPARISON:  None. FINDINGS: Patient is status post medial hemiarthroplasty. The femoral and tibial components are in good position. Postoperative air seen in the soft tissues. IMPRESSION: Postoperative changes as  above. Electronically Signed   By: Gerome Sam III M.D   On: 10/04/2017 20:23    Disposition:        Signed: Sheral Apley 10/05/2017, 7:00 AM

## 2017-10-05 NOTE — Discharge Instructions (Signed)
Keep dressing clean and dry

## 2017-10-05 NOTE — Progress Notes (Signed)
Physical Therapy Treatment Patient Details Name: Carol Gamble MRN: [id number] DOB: 1940-06-14 Today's Date: 10/05/2017    History of Present Illness 77 YO female s/p L UKR on 10/04/17. PMH includes HNP and multiple back surgeries, headaches, asthma, HTN, seizures, SOB, RTC repair.     PT Comments    The patient has excellent ROM  Postoperatively. Plans HHPT. Patient plans DC today. Goals are met.   Follow Up Recommendations  Follow surgeon's recommendation for DC plan and follow-up therapies;Supervision for mobility/OOB;Home health PT     Equipment Recommendations  None recommended by PT    Recommendations for Other Services       Precautions / Restrictions Precautions Precautions: Fall;Knee Restrictions Weight Bearing Restrictions: No Other Position/Activity Restrictions: WBAT     Mobility  Bed Mobility               General bed mobility comments: in recliner  Transfers Overall transfer level: Needs assistance Equipment used: Rolling walker (2 wheeled) Transfers: Sit to/from Stand Sit to Stand: Min guard         General transfer comment: for safety and cues for UE placement  Ambulation/Gait Ambulation/Gait assistance: Min guard Gait Distance (Feet): 100 Feet Assistive device: Rolling walker (2 wheeled) Gait Pattern/deviations: Step-through pattern     General Gait Details: Min guard for safety, verbal cuing for sequencing RW.pATIENT DEMONSTRATES RECIPROCAL GAIT   Stairs Stairs: Yes Stairs assistance: Min assist Stair Management: No rails;Backwards;With walker Number of Stairs: 2 General stair comments: SPOUSE PRESENT TIO ASSIST. PATIENT REPORTS THE STEPS AT HOME ARE DEEP ENOUGH FOR RW   Wheelchair Mobility    Modified Rankin (Stroke Patients Only)       Balance                                            Cognition Arousal/Alertness: Awake/alert                                             Exercises Total Joint Exercises Ankle Circles/Pumps: AROM;Both;10 reps;Supine Quad Sets: AROM;Both;AAROM Towel Squeeze: AROM;Left;10 reps;Supine Short Arc Quad: AROM;Left;10 reps;Supine Heel Slides: AAROM;Left;10 reps;Supine Hip ABduction/ADduction: AAROM;Left;10 reps;Supine Straight Leg Raises: AAROM;Left;10 reps;Supine Long Arc Quad: AROM;Left;10 reps;Seated Knee Flexion: AROM;Left;10 reps;Seated Goniometric ROM: 0-110 left knee flexion    General Comments        Pertinent Vitals/Pain Pain Score: 3  Pain Location: L knee  Pain Descriptors / Indicators: Sore Pain Intervention(s): Monitored during session;Premedicated before session;Ice applied    Home Living                      Prior Function            PT Goals (current goals can now be found in the care plan section) Progress towards PT goals: Progressing toward goals    Frequency    7X/week      PT Plan      Co-evaluation              AM-PAC PT "6 Clicks" Daily Activity  Outcome Measure  Difficulty turning over in bed (including adjusting bedclothes, sheets and blankets)?: A Little Difficulty moving from lying on back to sitting on the side of the bed? : A Little Difficulty sitting  down on and standing up from a chair with arms (e.g., wheelchair, bedside commode, etc,.)?: A Little Help needed moving to and from a bed to chair (including a wheelchair)?: A Little Help needed walking in hospital room?: A Little Help needed climbing 3-5 steps with a railing? : A Lot 6 Click Score: 17    End of Session   Activity Tolerance: Patient tolerated treatment well;No increased pain;Patient limited by fatigue Patient left: in chair;with call bell/phone within reach;with family/visitor present Nurse Communication: Mobility status PT Visit Diagnosis: Other abnormalities of gait and mobility (R26.89);Difficulty in walking, not elsewhere classified (R26.2)     Time: 1683-7290 PT Time Calculation  (min) (ACUTE ONLY): 39 min  Charges:  $Gait Training: 8-22 mins $Therapeutic Exercise: 8-22 mins $Self Care/Home Management: [place]    Claretha Cooper 10/05/2017, 12:40 PM

## 2017-10-05 NOTE — Care Management Note (Signed)
Case Management Note  Patient Details  Name: Carol Gamble MRN: [id number] Date of Birth: December 13, 1940  Subjective/Objective:      Discharge planning, spoke with patient and spouse at bedside. Have chosen [facility] for [facility] PT, evaluate and treat.   Action/Plan:  Contacted [facility] for referral. Has RW and 3n1. [phone]             Expected Discharge Date:  10/05/17               Expected Discharge Plan:  Home w Home Health Services  In-House Referral:  NA  Discharge planning Services  CM Consult  Post Acute Care Choice:  Home Health Choice offered to:  Patient  DME Arranged:  N/A DME Agency:  NA  HH Arranged:  PT HH Agency:  [facility] (formerly [facility]  Status of Service:  Completed, signed off  If discussed at [employer], dates discussed:    Additional Comments:  Alexis Goodell, RN 10/05/2017, 9:54 AM

## 2017-10-05 NOTE — Evaluation (Signed)
Occupational Therapy Evaluation Patient Details Name: Carol Gamble MRN: [id number] DOB: Jun 30, 1940 Today's Date: 10/05/2017    History of Present Illness 77 YO female s/p L UKR on 10/04/17. PMH includes HNP and multiple back surgeries, headaches, asthma, HTN, seizures, SOB, RTC repair.    Clinical Impression   Pt was admitted for the above.  At baseline, she was independent with basic ADLs at mod I level. Her husband will assist with adls as needed. Reviewed bathroom transfers, safety and knee precautions. No further OT is needed at this time    Follow Up Recommendations  Supervision/Assistance - 24 hour    Equipment Recommendations  None recommended by OT    Recommendations for Other Services       Precautions / Restrictions Precautions Precautions: Fall Restrictions Other Position/Activity Restrictions: WBAT       Mobility Bed Mobility         Supine to sit: Min guard        Transfers   Equipment used: Rolling walker (2 wheeled)   Sit to Stand: Min guard         General transfer comment: for safety and cues for UE placement    Balance                                           ADL either performed or assessed with clinical judgement   ADL Overall ADL's : Needs assistance/impaired Eating/Feeding: Independent   Grooming: Supervision/safety;Standing   Upper Body Bathing: Set up   Lower Body Bathing: Minimal assistance   Upper Body Dressing : Set up   Lower Body Dressing: Moderate assistance   Toilet Transfer: Min guard;Ambulation;BSC;Requires wide/bariatric   Toileting- Architect and Hygiene: Min guard;Sit to/from stand         General ADL Comments: ambulated to bathroom and performed ADL.  Educated on shower transfer, which she has done before when she had back sx     Vision         Perception     Praxis      Pertinent Vitals/Pain Pain Assessment: Faces Faces Pain Scale: Hurts a little bit Pain  Location: L knee  Pain Descriptors / Indicators: Sore Pain Intervention(s): Limited activity within patient's tolerance;Monitored during session;Premedicated before session;Repositioned     Hand Dominance     Extremity/Trunk Assessment Upper Extremity Assessment Upper Extremity Assessment: Overall WFL for tasks assessed           Communication Communication Communication: No difficulties   Cognition Arousal/Alertness: Awake/alert Behavior During Therapy: WFL for tasks assessed/performed Overall Cognitive Status: Within Functional Limits for tasks assessed                                     General Comments       Exercises     Shoulder Instructions      Home Living Family/patient expects to be discharged to:: Private residence Living Arrangements: Spouse/significant other Available Help at Discharge: Family;Available 24 hours/day               Bathroom Shower/Tub: Producer, television/film/video: Handicapped height     Home Equipment: Cane - single point;Walker - 2 wheels;Shower seat          Prior Functioning/Environment      ADL's / [employer]  Assistance Needed: Not able to cook when "knee got so bad"             OT Problem List:        OT Treatment/Interventions:      OT Goals(Current goals can be found in the care plan section) Acute Rehab OT Goals OT Goal Formulation: All assessment and education complete, DC therapy  OT Frequency:     Barriers to D/C:            Co-evaluation              AM-PAC PT "6 Clicks" Daily Activity     Outcome Measure Help from another person eating meals?: None Help from another person taking care of personal grooming?: A Little Help from another person toileting, which includes using toliet, bedpan, or urinal?: A Little Help from another person bathing (including washing, rinsing, drying)?: A Little Help from another person to put on and taking off regular upper body clothing?: A  Little Help from another person to put on and taking off regular lower body clothing?: A Lot 6 Click Score: 18   End of Session    Activity Tolerance: Patient tolerated treatment well Patient left: in chair;with call bell/phone within reach;with chair alarm set  OT Visit Diagnosis: Pain Pain - Right/Left: Left Pain - part of body: Knee                Time: 0981-1914 OT Time Calculation (min): 23 min Charges:  OT General Charges $OT Visit: 1 Visit OT Evaluation $OT Eval Low Complexity: 1 Low  Carol Gamble, OTR/L [phone] 10/05/2017  Carol Gamble 10/05/2017, 8:53 AM

## 2017-10-05 NOTE — Progress Notes (Signed)
Reviewed discharge information, education and prescriptions. Patient verbalizes understanding and has no further questions at this time.

## 2017-10-06 LAB — URINE CULTURE: Culture: NO GROWTH

## 2017-11-01 ENCOUNTER — Other Ambulatory Visit: Payer: Self-pay | Admitting: Nurse Practitioner

## 2018-01-03 NOTE — Progress Notes (Signed)
[facility]  PATIENT: SHAROL CROGHAN DOB: [birth year]   REASON FOR VISIT: follow-up for admission for word finding difficulty inability to express thoughts  Seizure disorder, recently diagnosed with atrial fibrillation HISTORY FROM: Patient     HISTORY OF PRESENT ILLNESS: UPDATE 12/4/2019CM Ms. Genco, 77 year old female returns for follow-up with history of seizure disorder.  EEG in the past showed mild cortical dysfunction left temporal region.  MRI of the brain and MRA were normal she remains on Keppra 500 mg twice daily without further seizure events.  She was found to be in atrial fib in May 2019 prior to having knee surgery.  She is now on Eliquis with minimal bruising and no bleeding.  She remains on Zocor without myalgias.  She remains independent in all activities of daily living, driving a vehicle without difficulty.  She returns for reevaluation   UPDATE 2/4/2019CM Ms. Kemmerling, 77 year old female returns for follow-up history of seizure disorder.  She has had episodes of word finding difficulty and inability to express her thoughts MRI of the brain and MRA were normal but EEG showed mild cortical dysfunction left temporal region.  She remains on Keppra 500 twice daily without further seizure events.  In addition she is on aspirin for secondary stroke prevention along with Zocor.  She is independent in activities of daily living.  She continues to drive without difficulty.  She returns for reevaluation    UPDATE  08/02/2018CM  Ms. Liz,  77 year-old female returns for follow-up seizure disorder. She had episode of word finding difficulty, and inability to express her thoughts. MRI and MRA were normal. EEG mild cortical dysfunction, left temporal region. She is currently on Keppra 500 twice daily without side effects and further seizure activity. She remains on aspirin for secondary stroke prevention and Zocor for hyperlipidemia. She returns for  reevaluation HISTORY 05/03/16 CMMs. Holiday, 77 year old female with history of hypertension and asthma palpitations renal calculi headaches arthritis presenting to the hospital with speech difficulties staring episodes and unresponsiveness and postictal confusion with right hemianopsia episodes. MRI normal MRA normal MRI with contrast subtle swelling and blurring of left-campus suspected to be related to seizure. EEG mild cortical dysfunction in the left temporal region. Carotid Doppler 1-39% ICA plaquing 2-D echo EF 55-60%. LDL 97. Hemoglobin A1c 5.6. Patient was placed on Keppra 500 twice a day. She returns to the office today for hospital follow-up. She has not had further episodes of seizure activity. She is currently on Keppra 500 twice daily without side effects. She is also on aspirin 0.81 for secondary stroke prevention. She is on Zocor for hyperlipidemia without complaints of myalgias. Blood pressure in the office today 126/75. She returns for reevaluation   REVIEW OF SYSTEMS: Full 14 system review of systems performed and notable only for those listed, all others are neg:  Constitutional: neg Cardiovascular: neg Ear/Nose/Throat: Hearing loss  Skin: neg Eyes: Blurred vision Respiratory: neg Gastroitestinal: neg  Hematology/Lymphatic: neg  Endocrine: neg Musculoskeletal:neg Allergy/Immunology: neg Neurological: Seizure disorder Psychiatric: neg Sleep : neg   ALLERGIES: Allergies  Allergen Reactions  . Anesthesia S-I-60 Nausea And Vomiting and Other (See Comments)    Difficulty waking up from anesthesia. Can't breath.  Marland Kitchen Shrimp [Shellfish Allergy] Hives    HOME MEDICATIONS: Outpatient Medications Prior to Visit  Medication Sig Dispense Refill  . apixaban (ELIQUIS) 5 MG TABS tablet Take 5 mg by mouth 2 (two) times daily.    Marland Kitchen atenolol (TENORMIN) 50 MG tablet Take 50 mg by  mouth daily with breakfast.    . CALCIUM-VITAMIN D PO Take 1 tablet by mouth 2 (two) times daily.    .  diphenhydramine-acetaminophen (TYLENOL PM) 25-500 MG TABS tablet Take 1 tablet by mouth at bedtime as needed (sleep).     . fluticasone (FLONASE) 50 MCG/ACT nasal spray Place 2 sprays into both nostrils daily.     . hydroxypropyl methylcellulose (ISOPTO TEARS) 2.5 % ophthalmic solution Place 2 drops into both eyes 3 (three) times daily as needed for dry eyes.     Marland Kitchen levETIRAcetam (KEPPRA) 500 MG tablet TAKE 1 TABLET BY MOUTH TWO  TIMES DAILY 180 tablet 3  . lisinopril-hydrochlorothiazide (PRINZIDE,ZESTORETIC) 20-12.5 MG per tablet Take 1 tablet by mouth daily with breakfast.    . Multiple Vitamin (MULTIVITAMIN WITH MINERALS) TABS Take 1 tablet by mouth daily.    . Omega-3 Fatty Acids (FISH OIL) 1200 MG CPDR Take 1 capsule by mouth 2 (two) times daily.    Marland Kitchen omeprazole (PRILOSEC) 40 MG capsule Take 40 mg by mouth at bedtime.    . simvastatin (ZOCOR) 40 MG tablet Take 40 mg by mouth at bedtime.    . gabapentin (NEURONTIN) 100 MG capsule Take 1 capsule (100 mg total) by mouth 3 (three) times daily. (Patient not taking: Reported on 01/04/2018) 90 capsule 2  . oxyCODONE-acetaminophen (PERCOCET) 5-325 MG tablet Take 1 tablet by mouth every 4 (four) hours as needed for severe pain. (Patient not taking: Reported on 01/04/2018) 40 tablet 0   No facility-administered medications prior to visit.     PAST MEDICAL HISTORY: Past Medical History:  Diagnosis Date  . Arthritis    HNP- low back   . Complication of anesthesia    pt. reports that when she wakes up she had a hard time catching her breath & the PACU team told her that her BP & pulse were not doing what it was suppose to do  . Dyslipidemia 02/27/2016  . GERD (gastroesophageal reflux disease)   . Headache(784.0)   . History of asthma    childhood, age 10  . History of kidney stones about 2008ish  . HNP (herniated nucleus pulposus), lumbar 06/30/2012  . Hypertension    states she always has palpitations   . PONV (postoperative nausea and vomiting)     . Seizure (HCC)   . Seizures (HCC) 02/2016  . Stroke-like symptoms 02/27/2016    PAST SURGICAL HISTORY: Past Surgical History:  Procedure Laterality Date  . ABDOMINAL HYSTERECTOMY     1993  . BACK SURGERY     2000,01,10  . CYSTOSCOPY/RETROGRADE/URETEROSCOPY/STONE EXTRACTION WITH BASKET Left 03/01/2013   Procedure: CYSTOSCOPY/RETROGRADE/URETEROSCOPY/STONE EXTRACTION WITH BASKET/INSERTION LEFT DOUBLE  J STENT;  Surgeon: Su Grand, MD;  Location: [facility];  Service: Urology;  Laterality: Left;  . FINGER MASS EXCISION  2008  . FOOT TENDON SURGERY  1985  . HOLMIUM LASER APPLICATION Left 03/01/2013   Procedure: HOLMIUM LASER APPLICATION;  Surgeon: Su Grand, MD;  Location: [facility];  Service: Urology;  Laterality: Left;  . INNER EAR SURGERY     x2  . PARTIAL KNEE ARTHROPLASTY Left 10/04/2017   Procedure: UNICOMPARTMENTAL LEFT  KNEE;  Surgeon: Sheral Apley, MD;  Location: [facility];  Service: Orthopedics;  Laterality: Left;  Adductor Block  . ROTATOR CUFF REPAIR     both shoulders done, last one - 2010  . TONSILLECTOMY      FAMILY HISTORY: Family History  Problem Relation Age of Onset  . Alzheimer's disease Mother   .  Aneurysm Brother        brain, age 37    SOCIAL HISTORY: Social History   Socioeconomic History  . Marital status: Married    Spouse name: [name]  . Number of children: 3  . Years of education: 9.5  . Highest education level: Not on file  Occupational History    Comment: retired  Engineer, production  . Financial resource strain: Not on file  . Food insecurity:    Worry: Not on file    Inability: Not on file  . Transportation needs:    Medical: Not on file    Non-medical: Not on file  Tobacco Use  . Smoking status: Passive Smoke Exposure - Never Smoker  . Smokeless tobacco: Never Used  Substance and Sexual Activity  . Alcohol use: No  . Drug use: No  . Sexual activity: Not on file  Lifestyle  . Physical activity:    Days per week: Not on file    Minutes per  session: Not on file  . Stress: Not on file  Relationships  . Social connections:    Talks on phone: Not on file    Gets together: Not on file    Attends religious service: Not on file    Active member of club or organization: Not on file    Attends meetings of clubs or organizations: Not on file    Relationship status: Not on file  . Intimate partner violence:    Fear of current or ex partner: Not on file    Emotionally abused: Not on file    Physically abused: Not on file    Forced sexual activity: Not on file  Other Topics Concern  . Not on file  Social History Narrative   Lives with husband   Caffeine- coffee, 2-3 cups daily     PHYSICAL EXAM  Vitals:   01/04/18 1058  BP: 126/68  Pulse: 85  Weight: 210 lb 12.8 oz (95.6 kg)  Height: 5\' 2"  (1.575 m)   Body mass index is 38.56 kg/m.  Generalized: Well developed, Obese female in no acute distress  Head: normocephalic and atraumatic,. Oropharynx benign  Neck: Supple, no carotid bruits  Cardiac: Irregularly irregular rate rhythm, no murmur  Musculoskeletal: No deformity   Neurological examination   Mentation: Alert oriented to time, place, history taking. Attention span and concentration appropriate. Recent and remote memory intact.  Follows all commands speech and language fluent.   Cranial nerve II-XII: .Pupils were equal round reactive to light extraocular movements were full, visual field were full on confrontational test. Facial sensation and strength were normal. hearing was intact to finger rubbing bilaterally. Uvula tongue midline. head turning and shoulder shrug were normal and symmetric.Tongue protrusion into cheek strength was normal. Motor: normal bulk and tone, full strength in the BUE, BLE, fine finger movements normal, no pronator drift. No focal weakness Sensory: normal and symmetric to light touch,  Coordination: finger-nose-finger, heel-to-shin bilaterally, no dysmetria Reflexes: 1+ upper lower and  symmetric, plantar responses were flexor bilaterally. Gait and Station: Rising up from seated position without assistance, normal stance,  moderate stride, good arm swing, smooth turning, ambulates with single-point cane DIAGNOSTIC DATA (LABS, IMAGING, TESTING) - I reviewed patient records, labs, notes, testing and imaging myself where available.  Lab Results  Component Value Date   WBC 5.8 09/28/2017   HGB 14.7 09/28/2017   HCT 43.5 09/28/2017   MCV 91.6 09/28/2017   PLT 249 09/28/2017      Component  Value Date/Time   NA 146 (H) 10/04/2017 0917   K 3.5 10/04/2017 0917   CL 109 10/04/2017 0917   CO2 26 10/04/2017 0917   GLUCOSE 115 (H) 10/04/2017 0917   BUN 24 (H) 10/04/2017 0917   CREATININE 0.89 10/04/2017 0917   CALCIUM 9.6 10/04/2017 0917   PROT 6.9 02/27/2016 1929   ALBUMIN 4.1 02/27/2016 1929   AST 18 02/27/2016 1929   ALT 18 02/27/2016 1929   ALKPHOS 56 02/27/2016 1929   BILITOT 0.6 02/27/2016 1929   GFRNONAA >60 10/04/2017 0917   GFRAA >60 10/04/2017 0917   Lab Results  Component Value Date   CHOL 180 02/28/2016   HDL 62 02/28/2016   LDLCALC 97 02/28/2016   TRIG 105 02/28/2016   CHOLHDL 2.9 02/28/2016   Lab Results  Component Value Date   HGBA1C 5.6 02/28/2016    ASSESSMENT AND PLAN  77 y.o. year old female  has a past medical history of history of hypertension and asthma palpitations renal calculi headaches arthritis presenting to the hospital with speech difficulties staring episodes and unresponsiveness and postictal confusion with right hemianopsia episodes. MRI normal MRA normal MRI with contrast subtle swelling and blurring of left-campus suspected to be related to seizure. EEG mild cortical dysfunction in the left temporal region. Carotid Doppler 1-39% ICA plaquing 2-D echo EF 55-60%. LDL 97. Hemoglobin A1c 5.6.  EKG May 2019 with atrial fibrillation.  Now on Eliquis The patient is a current patient of Dr. Roda ShuttersXu  who has left our practice.  This note is  sent to the work in doctor.     Continue Keppra 500mg  twice daily will reorder Continue Eliquis  for secondary stroke prevention and atrial fibrillation Continue Zocor for hyperlipidemia Keep B/P less than 130/80  todays reading 126/68 Call for seizure activity Follow up  yearly I spent 20 minutes in total face to face time with the patient more than 50% of which was spent counseling and coordination of care, reviewing test results reviewing medications and discussing and reviewing the diagnosis of seizure disorder and precautions and further treatment options.  Patient encouraged to be more physically active and lose weight., Nilda RiggsNancy Carolyn Martin, [facility] ClinicsGNP, [facility] MemphisBC, APRN  [facility] [address], [address] [place], [place] [address] [phone]

## 2018-01-04 ENCOUNTER — Ambulatory Visit: Payer: Medicare Other | Admitting: Nurse Practitioner

## 2018-01-04 ENCOUNTER — Encounter: Payer: Self-pay | Admitting: Nurse Practitioner

## 2018-01-04 VITALS — BP 126/68 | HR 85 | Ht 62.0 in | Wt 210.8 lb

## 2018-01-04 DIAGNOSIS — E785 Hyperlipidemia, unspecified: Secondary | ICD-10-CM | POA: Diagnosis not present

## 2018-01-04 DIAGNOSIS — I480 Paroxysmal atrial fibrillation: Secondary | ICD-10-CM | POA: Diagnosis not present

## 2018-01-04 DIAGNOSIS — R569 Unspecified convulsions: Secondary | ICD-10-CM | POA: Diagnosis not present

## 2018-01-04 DIAGNOSIS — I10 Essential (primary) hypertension: Secondary | ICD-10-CM | POA: Diagnosis not present

## 2018-01-04 MED ORDER — LEVETIRACETAM 500 MG PO TABS: 500.0000 mg | ORAL_TABLET | Freq: Two times a day (BID) | ORAL | 3 refills | Status: DC

## 2018-01-04 NOTE — Patient Instructions (Signed)
Continue Keppra 500mg  twice daily will reorder Continue Eliquis  for secondary stroke prevention and atrial fibrillation Continue Zocor for hyperlipidemia Keep B/P less than 130/80  todays reading 126/68 Call for seizure activity Follow up  yearly

## 2018-01-04 NOTE — Progress Notes (Signed)
I have read the note, and I agree with the clinical assessment and plan.  Charles K Willis   

## 2018-03-29 ENCOUNTER — Telehealth: Payer: Self-pay | Admitting: Cardiovascular Disease

## 2018-03-29 NOTE — Telephone Encounter (Signed)
That's fine

## 2018-03-29 NOTE — Telephone Encounter (Signed)
  Patient would like to switch providers from Dr Eden Emms to Dr Katrinka Blazing

## 2018-03-29 NOTE — Telephone Encounter (Signed)
Okay 

## 2018-05-25 ENCOUNTER — Telehealth: Payer: Self-pay | Admitting: Interventional Cardiology

## 2018-05-25 NOTE — Telephone Encounter (Signed)
lmom to call back regarding her appt for May 1,2020 with Dr Katrinka Blazing

## 2018-05-26 NOTE — Telephone Encounter (Signed)
Will forward to Dr. Smith's nurse. 

## 2018-05-26 NOTE — Telephone Encounter (Signed)
Patient set up for MyChart? Yes consent sent through mychart  Is patient using Smartphone/computer/tablet? smartphone  Did audio/video work?  Does patient need telephone visit no  Best phone number to use? [phone]     Virtual Visit Pre-Appointment Phone Call  "(Name), I am calling you today to discuss your upcoming appointment. We are currently trying to limit exposure to the virus that causes COVID-19 by seeing patients at home rather than in the office."  1. "What is the BEST phone number to call the day of the visit?" - include this in appointment notes  2. Do you have or have access to (through a family member/friend) a smartphone with video capability that we can use for your visit?" a. If yes - list this number in appt notes as cell (if different from BEST phone #) and list the appointment type as a VIDEO visit in appointment notes b. If no - list the appointment type as a PHONE visit in appointment notes  3. Confirm consent - "In the setting of the current Covid19 crisis, you are scheduled for a (phone or video) visit with your provider on (date) at (time).  Just as we do with many in-office visits, in order for you to participate in this visit, we must obtain consent.  If you'd like, I can send this to your mychart (if signed up) or email for you to review.  Otherwise, I can obtain your verbal consent now.  All virtual visits are billed to your insurance company just like a normal visit would be.  By agreeing to a virtual visit, we'd like you to understand that the technology does not allow for your provider to perform an examination, and thus may limit your provider's ability to fully assess your condition. If your provider identifies any concerns that need to be evaluated in person, we will make arrangements to do so.  Finally, though the technology is pretty good, we cannot assure that it will always work on either your or our end, and in the setting of a video visit, we may  have to convert it to a phone-only visit.  In either situation, we cannot ensure that we have a secure connection.  Are you willing to proceed?" STAFF: Did the patient verbally acknowledge consent to telehealth visit? Document YES/NO here: yes  4. Advise patient to be prepared - "Two hours prior to your appointment, go ahead and check your blood pressure, pulse, oxygen saturation, and your weight (if you have the equipment to check those) and write them all down. When your visit starts, your provider will ask you for this information. If you have an Apple Watch or Kardia device, please plan to have heart rate information ready on the day of your appointment. Please have a pen and paper handy nearby the day of the visit as well."  5. Give patient instructions for MyChart download to smartphone OR Doximity/Doxy.me as below if video visit (depending on what platform provider is using)  6. Inform patient they will receive a phone call 15 minutes prior to their appointment time (may be from unknown caller ID) so they should be prepared to answer    TELEPHONE CALL NOTE  Zillah C Daise has been deemed a candidate for a follow-up tele-health visit to limit community exposure during the Covid-19 pandemic. I spoke with the patient via phone to ensure availability of phone/video source, confirm preferred email & phone number, and discuss instructions and expectations.  I reminded Rhett C Duff to  be prepared with any vital sign and/or heart rhythm information that could potentially be obtained via home monitoring, at the time of her visit. I reminded Tanveer C Wind to expect a phone call prior to her visit.  Berle Mull 05/26/2018 10:09 AM   INSTRUCTIONS FOR DOWNLOADING THE MYCHART APP TO SMARTPHONE  - The patient must first make sure to have activated MyChart and know their login information - If Apple, go to [employer] and type in MyChart in the search bar and download the app. If Android, ask  patient to go to [employer] and type in [place] in the search bar and download the app. The app is free but as with any other app downloads, their phone may require them to verify saved payment information or Apple/Android password.  - The patient will need to then log into the app with their MyChart username and password, and select [facility] as their healthcare provider to link the account. When it is time for your visit, go to the MyChart app, find appointments, and click Begin Video Visit. Be sure to Select Allow for your device to access the Microphone and Camera for your visit. You will then be connected, and your provider will be with you shortly.  **If they have any issues connecting, or need assistance please contact MyChart service desk [phone])**  **If using a computer, in order to ensure the best quality for their visit they will need to use either of the following Internet Browsers: [employer] or Google Chrome**  IF USING DOXIMITY or DOXY.ME - The patient will receive a link just prior to their visit by text.     FULL LENGTH CONSENT FOR TELE-HEALTH VISIT   I hereby voluntarily request, consent and authorize [facility] and its employed or contracted physicians, physician assistants, nurse practitioners or other licensed health care professionals (the Practitioner), to provide me with telemedicine health care services (the Services") as deemed necessary by the treating Practitioner. I acknowledge and consent to receive the Services by the Practitioner via telemedicine. I understand that the telemedicine visit will involve communicating with the Practitioner through live audiovisual communication technology and the disclosure of certain medical information by electronic transmission. I acknowledge that I have been given the opportunity to request an in-person assessment or other available alternative prior to the telemedicine visit and am voluntarily  participating in the telemedicine visit.  I understand that I have the right to withhold or withdraw my consent to the use of telemedicine in the course of my care at any time, without affecting my right to future care or treatment, and that the Practitioner or I may terminate the telemedicine visit at any time. I understand that I have the right to inspect all information obtained and/or recorded in the course of the telemedicine visit and may receive copies of available information for a reasonable fee.  I understand that some of the potential risks of receiving the Services via telemedicine include:   Delay or interruption in medical evaluation due to technological equipment failure or disruption;  Information transmitted may not be sufficient (e.g. poor resolution of images) to allow for appropriate medical decision making by the Practitioner; and/or   In rare instances, security protocols could fail, causing a breach of personal health information.  Furthermore, I acknowledge that it is my responsibility to provide information about my medical history, conditions and care that is complete and accurate to the best of my ability. I acknowledge that Practitioner's  advice, recommendations, and/or decision may be based on factors not within their control, such as incomplete or inaccurate data provided by me or distortions of diagnostic images or specimens that may result from electronic transmissions. I understand that the practice of medicine is not an exact science and that Practitioner makes no warranties or guarantees regarding treatment outcomes. I acknowledge that I will receive a copy of this consent concurrently upon execution via email to the email address I last provided but may also request a printed copy by calling the office of [facility].    I understand that my insurance will be billed for this visit.   I have read or had this consent read to me.  I understand the contents of this  consent, which adequately explains the benefits and risks of the Services being provided via telemedicine.   I have been provided ample opportunity to ask questions regarding this consent and the Services and have had my questions answered to my satisfaction.  I give my informed consent for the services to be provided through the use of telemedicine in my medical care  By participating in this telemedicine visit I agree to the above.

## 2018-05-26 NOTE — Telephone Encounter (Signed)
Pt returned this office call she is waiting on your call back please.

## 2018-06-01 NOTE — Progress Notes (Signed)
Virtual Visit via Video Note   This visit type was conducted due to national recommendations for restrictions regarding the COVID-19 Pandemic (e.g. social distancing) in an effort to limit this patient's exposure and mitigate transmission in our community.  Due to her co-morbid illnesses, this patient is at least at moderate risk for complications without adequate follow up.  This format is felt to be most appropriate for this patient at this time.  All issues noted in this document were discussed and addressed.  A limited physical exam was performed with this format.  Please refer to the patient's chart for her consent to telehealth for [facility].   Evaluation Performed:  Follow-up visit  Date:  06/02/2018   ID:  Evellyn, Helbling 11-Sep-1940, MRN [id number]  Patient Location: Home Provider Location: Office  PCP:  Blair Heys, MD  Cardiologist:  Charlton Haws, MD  Electrophysiologist:  None   Chief Complaint:  AF/Anticoagulation  History of Present Illness:    ENYAH NEDD is a 78 y.o. female with chronic atrial fibrillation, hypertension and anticoagulation therapy.  She is doing well.  She denies cardiovascular complication.  No bleeding on apixaban.  She has not had syncope or chest pain.  Relatively inactive since the onset of restrictions due to COVID-19 pandemic.  She has had no falls.  They are both concerned about her husband's blood pressures, he is also a patient.  Systolic pressures have been running greater than 150 mmHg.  We will make arrangements to see him within the next 5 to 7 days.  The patient does not have symptoms concerning for COVID-19 infection (fever, chills, cough, or new shortness of breath).    Past Medical History:  Diagnosis Date  . Arthritis    HNP- low back   . Complication of anesthesia    pt. reports that when she wakes up she had a hard time catching her breath & the PACU team told her that her BP & pulse were not doing what it was  suppose to do  . Dyslipidemia 02/27/2016  . GERD (gastroesophageal reflux disease)   . Headache(784.0)   . History of asthma    childhood, age 31  . History of kidney stones about 2008ish  . HNP (herniated nucleus pulposus), lumbar 06/30/2012  . Hypertension    states she always has palpitations   . PONV (postoperative nausea and vomiting)   . Seizure (HCC)   . Seizures (HCC) 02/2016  . Stroke-like symptoms 02/27/2016   Past Surgical History:  Procedure Laterality Date  . ABDOMINAL HYSTERECTOMY     1993  . BACK SURGERY     2000,01,10  . CYSTOSCOPY/RETROGRADE/URETEROSCOPY/STONE EXTRACTION WITH BASKET Left 03/01/2013   Procedure: CYSTOSCOPY/RETROGRADE/URETEROSCOPY/STONE EXTRACTION WITH BASKET/INSERTION LEFT DOUBLE  J STENT;  Surgeon: Su Grand, MD;  Location: [facility];  Service: Urology;  Laterality: Left;  . FINGER MASS EXCISION  2008  . FOOT TENDON SURGERY  1985  . HOLMIUM LASER APPLICATION Left 03/01/2013   Procedure: HOLMIUM LASER APPLICATION;  Surgeon: Su Grand, MD;  Location: [facility];  Service: Urology;  Laterality: Left;  . INNER EAR SURGERY     x2  . PARTIAL KNEE ARTHROPLASTY Left 10/04/2017   Procedure: UNICOMPARTMENTAL LEFT  KNEE;  Surgeon: Sheral Apley, MD;  Location: [facility];  Service: Orthopedics;  Laterality: Left;  Adductor Block  . ROTATOR CUFF REPAIR     both shoulders done, last one - 2010  . TONSILLECTOMY       Current  Meds  Medication Sig  . apixaban (ELIQUIS) 5 MG TABS tablet Take 5 mg by mouth 2 (two) times daily.  Marland Kitchen atenolol (TENORMIN) 50 MG tablet Take 50 mg by mouth daily with breakfast.  . CALCIUM-VITAMIN D PO Take 1 tablet by mouth 2 (two) times daily.  . diphenhydramine-acetaminophen (TYLENOL PM) 25-500 MG TABS tablet Take 1 tablet by mouth at bedtime as needed (sleep).   . fluticasone (FLONASE) 50 MCG/ACT nasal spray Place 2 sprays into both nostrils daily.   . hydroxypropyl methylcellulose (ISOPTO TEARS) 2.5 % ophthalmic solution Place 2 drops into  both eyes 3 (three) times daily as needed for dry eyes.   Marland Kitchen levETIRAcetam (KEPPRA) 500 MG tablet Take 1 tablet (500 mg total) by mouth 2 (two) times daily.  Marland Kitchen lisinopril-hydrochlorothiazide (PRINZIDE,ZESTORETIC) 20-12.5 MG per tablet Take 1 tablet by mouth daily with breakfast.  . Multiple Vitamin (MULTIVITAMIN WITH MINERALS) TABS Take 1 tablet by mouth daily.  . Omega-3 Fatty Acids (FISH OIL) 1200 MG CPDR Take 1 capsule by mouth 2 (two) times daily.  Marland Kitchen omeprazole (PRILOSEC) 40 MG capsule Take 40 mg by mouth at bedtime.  . simvastatin (ZOCOR) 40 MG tablet Take 40 mg by mouth at bedtime.     Allergies:   Anesthesia s-i-60 and Shrimp [shellfish allergy]   Social History   Tobacco Use  . Smoking status: Passive Smoke Exposure - Never Smoker  . Smokeless tobacco: Never Used  Substance Use Topics  . Alcohol use: No  . Drug use: No     Family Hx: The patient's family history includes Alzheimer's disease in her mother; Aneurysm in her brother.  ROS:   Please see the history of present illness.    No complaints.  Did have partial knee replacement.  The right knee is doing great now.  Her left knee is now starting to give her trouble.  This was performed in September 2019.  It was performed by Dr. Mckinley Jewel. All other systems reviewed and are negative.   Prior CV studies:   The following studies were reviewed today:  ECHOCARDIOGRAM 2019: Study Conclusions  - Left ventricle: The cavity size was normal. Wall thickness was   increased in a pattern of mild LVH. There was mild focal basal   hypertrophy of the septum. Systolic function was normal. The   estimated ejection fraction was in the range of 55% to 60%. Wall   motion was normal; there were no regional wall motion   abnormalities. - Aortic valve: Trileaflet; mildly thickened, mildly calcified   leaflets. There was mild regurgitation. - Mitral valve: There was mild regurgitation. - Tricuspid valve: There was mild  regurgitation. - Pulmonary arteries: Systolic pressure was mildly increased. PA   peak pressure: 37 mm Hg (S).  Labs/Other Tests and Data Reviewed:    EKG:  An ECG dated 06/2017 was personally reviewed today and demonstrated:  Atrial fibrillation, controlled rate, low voltage.  Recent Labs: 09/28/2017: Hemoglobin 14.7; Platelets 249 10/04/2017: BUN 24; Creatinine, Ser 0.89; Potassium 3.5; Sodium 146   Recent Lipid Panel Lab Results  Component Value Date/Time   CHOL 180 02/28/2016 07:49 AM   TRIG 105 02/28/2016 07:49 AM   HDL 62 02/28/2016 07:49 AM   CHOLHDL 2.9 02/28/2016 07:49 AM   LDLCALC 97 02/28/2016 07:49 AM    Wt Readings from Last 3 Encounters:  06/02/18 213 lb (96.6 kg)  01/04/18 210 lb 12.8 oz (95.6 kg)  10/04/17 212 lb (96.2 kg)     Objective:  Vital Signs:  BP 113/84   Pulse 72   Ht 5\' 2"  (1.575 m)   Wt 213 lb (96.6 kg)   BMI 38.96 kg/m    VITAL SIGNS:  reviewed GEN:  no acute distress RESPIRATORY:  normal respiratory effort, symmetric expansion MUSCULOSKELETAL:  no obvious deformities.  ASSESSMENT & PLAN:    1. Persistent atrial fibrillation   2. Essential hypertension   3. TIA (transient ischemic attack)   4. Anticoagulant long-term use   5. 2019 novel coronavirus disease (COVID-19)    PLAN:  1. Asymptomatic with reference to breathing, energy, and endurance.  She feels occasional palpitations when she lies down each evening which is not at all distressing or bothersome. CHADS VASC = 4. 2. Blood pressure is adequately controlled.  Target 130/80 mmHg. 3. No recurrent neurological symptoms. 4. Needs twice yearly creatinine and hemoglobin.  COVID-19 Education: The signs and symptoms of COVID-19 were discussed with the patient and how to seek care for testing (follow up with PCP or arrange E-visit).  The importance of social distancing was discussed today.  Time:   Today, I have spent 15 minutes with the patient with telehealth technology discussing  the above problems.     Medication Adjustments/Labs and Tests Ordered: Current medicines are reviewed at length with the patient today.  Concerns regarding medicines are outlined above.   Tests Ordered: No orders of the defined types were placed in this encounter.   Medication Changes: No orders of the defined types were placed in this encounter.   Disposition:  Follow up in 1 year(s)  Signed, Lesleigh NoeHenry W Niles Ess III, MD  06/02/2018 11:30 AM    [facility]

## 2018-06-02 ENCOUNTER — Encounter: Payer: Self-pay | Admitting: Interventional Cardiology

## 2018-06-02 ENCOUNTER — Other Ambulatory Visit: Payer: Self-pay

## 2018-06-02 ENCOUNTER — Telehealth (INDEPENDENT_AMBULATORY_CARE_PROVIDER_SITE_OTHER): Payer: Medicare Other | Admitting: Interventional Cardiology

## 2018-06-02 VITALS — BP 113/84 | HR 72 | Ht 62.0 in | Wt 213.0 lb

## 2018-06-02 DIAGNOSIS — Z7901 Long term (current) use of anticoagulants: Secondary | ICD-10-CM

## 2018-06-02 DIAGNOSIS — I4819 Other persistent atrial fibrillation: Secondary | ICD-10-CM | POA: Diagnosis not present

## 2018-06-02 DIAGNOSIS — I10 Essential (primary) hypertension: Secondary | ICD-10-CM

## 2018-06-02 DIAGNOSIS — U071 COVID-19: Secondary | ICD-10-CM

## 2018-06-02 DIAGNOSIS — G459 Transient cerebral ischemic attack, unspecified: Secondary | ICD-10-CM

## 2018-06-02 NOTE — Patient Instructions (Signed)

## 2018-08-01 ENCOUNTER — Other Ambulatory Visit: Payer: Self-pay

## 2018-08-01 ENCOUNTER — Other Ambulatory Visit: Payer: Self-pay | Admitting: Family Medicine

## 2018-08-01 DIAGNOSIS — Z1231 Encounter for screening mammogram for malignant neoplasm of breast: Secondary | ICD-10-CM

## 2018-08-01 DIAGNOSIS — R0989 Other specified symptoms and signs involving the circulatory and respiratory systems: Secondary | ICD-10-CM

## 2018-08-07 ENCOUNTER — Encounter: Payer: Self-pay | Admitting: Surgery

## 2018-08-07 ENCOUNTER — Ambulatory Visit (HOSPITAL_COMMUNITY)
Admission: RE | Admit: 2018-08-07 | Discharge: 2018-08-07 | Disposition: A | Discharge status: Home / Self Care | Payer: Medicare Other | Source: Ambulatory Visit | Attending: Family | Admitting: Family

## 2018-08-07 ENCOUNTER — Other Ambulatory Visit: Payer: Self-pay

## 2018-08-07 ENCOUNTER — Ambulatory Visit (INDEPENDENT_AMBULATORY_CARE_PROVIDER_SITE_OTHER): Payer: Medicare Other | Admitting: Surgery

## 2018-08-07 VITALS — BP 122/62 | HR 77 | Temp 97.5°F | Resp 20 | Ht 62.0 in | Wt 216.0 lb

## 2018-08-07 DIAGNOSIS — R0989 Other specified symptoms and signs involving the circulatory and respiratory systems: Secondary | ICD-10-CM | POA: Insufficient documentation

## 2018-08-07 DIAGNOSIS — I70213 Atherosclerosis of native arteries of extremities with intermittent claudication, bilateral legs: Secondary | ICD-10-CM

## 2018-08-07 NOTE — Progress Notes (Signed)
Vascular and Vein Specialist of [facility]  Patient name: Carol Gamble MRN: [id number] DOB: 1940/07/13 Sex: female   REQUESTING PROVIDER:    Dr. Marisue Humble   REASON FOR CONSULT:    Decreased pulses on screeningnexam  HISTORY OF PRESENT ILLNESS:   Carol Gamble is a 78 y.o. female, who is referred today for evaluation of peripheral vascular disease.  The patient underwent a screening insurance evaluation which was abnormal and so she was referred to [place] for further evaluation.  She denies any symptoms of claudication.  She does not have rest pain.  She does not have any open wounds on her legs.  Patient does suffer from hypercholesterolemia which is treated with a statin.  Her blood pressure is medically managed with an ACE inhibitor.  She is a non-smoker.  PAST MEDICAL HISTORY    Past Medical History:  Diagnosis Date  . Arthritis    HNP- low back   . Complication of anesthesia    pt. reports that when she wakes up she had a hard time catching her breath & the PACU team told her that her BP & pulse were not doing what it was suppose to do  . Dyslipidemia 02/27/2016  . GERD (gastroesophageal reflux disease)   . Headache(784.0)   . History of asthma    childhood, age 26  . History of kidney stones about 2008ish  . HNP (herniated nucleus pulposus), lumbar 06/30/2012  . Hypertension    states she always has palpitations   . PONV (postoperative nausea and vomiting)   . Seizure ([place])   . Seizures ([place]) 02/2016  . Stroke-like symptoms 02/27/2016     FAMILY HISTORY   Family History  Problem Relation Age of Onset  . Alzheimer's disease Mother   . Aneurysm Brother        brain, age 6    SOCIAL HISTORY:   Social History   Socioeconomic History  . Marital status: Married    Spouse name: Quillian Quince  . Number of children: 3  . Years of education: 9.5  . Highest education level: Not on file  Occupational History    Comment: retired   Scientific laboratory technician  . Financial resource strain: Not on file  . Food insecurity    Worry: Not on file    Inability: Not on file  . Transportation needs    Medical: Not on file    Non-medical: Not on file  Tobacco Use  . Smoking status: Passive Smoke Exposure - Never Smoker  . Smokeless tobacco: Never Used  Substance and Sexual Activity  . Alcohol use: No  . Drug use: No  . Sexual activity: Not on file  Lifestyle  . Physical activity    Days per week: Not on file    Minutes per session: Not on file  . Stress: Not on file  Relationships  . Social Herbalist on phone: Not on file    Gets together: Not on file    Attends religious service: Not on file    Active member of club or organization: Not on file    Attends meetings of clubs or organizations: Not on file    Relationship status: Not on file  . Intimate partner violence    Fear of current or ex partner: Not on file    Emotionally abused: Not on file    Physically abused: Not on file    Forced sexual activity: Not on file  Other Topics Concern  .  Not on file  Social History Narrative   Lives with husband   Caffeine- coffee, 2-3 cups daily    ALLERGIES:    Allergies  Allergen Reactions  . Anesthesia S-I-60 Nausea And Vomiting and Other (See Comments)    Difficulty waking up from anesthesia. Can't breath.  Marland Kitchen. Shrimp [Shellfish Allergy] Hives    CURRENT MEDICATIONS:    Current Outpatient Medications  Medication Sig Dispense Refill  . apixaban (ELIQUIS) 5 MG TABS tablet Take 5 mg by mouth 2 (two) times daily.    Marland Kitchen. atenolol (TENORMIN) 50 MG tablet Take 50 mg by mouth daily with breakfast.    . CALCIUM-VITAMIN D PO Take 1 tablet by mouth 2 (two) times daily.    . diphenhydramine-acetaminophen (TYLENOL PM) 25-500 MG TABS tablet Take 1 tablet by mouth at bedtime as needed (sleep).     . fluticasone (FLONASE) 50 MCG/ACT nasal spray Place 2 sprays into both nostrils daily.     . hydroxypropyl methylcellulose  (ISOPTO TEARS) 2.5 % ophthalmic solution Place 2 drops into both eyes 3 (three) times daily as needed for dry eyes.     Marland Kitchen. levETIRAcetam (KEPPRA) 500 MG tablet Take 1 tablet (500 mg total) by mouth 2 (two) times daily. 180 tablet 3  . lisinopril-hydrochlorothiazide (PRINZIDE,ZESTORETIC) 20-12.5 MG per tablet Take 1 tablet by mouth daily with breakfast.    . Multiple Vitamin (MULTIVITAMIN WITH MINERALS) TABS Take 1 tablet by mouth daily.    . Omega-3 Fatty Acids (FISH OIL) 1200 MG CPDR Take 1 capsule by mouth 2 (two) times daily.    Marland Kitchen. omeprazole (PRILOSEC) 40 MG capsule Take 40 mg by mouth at bedtime.    . simvastatin (ZOCOR) 40 MG tablet Take 40 mg by mouth at bedtime.     No current facility-administered medications for this visit.     REVIEW OF SYSTEMS:   [X]  denotes positive finding, [ ]  denotes negative finding Cardiac  Comments:  Chest pain or chest pressure:    Shortness of breath upon exertion:    Short of breath when lying flat:    Irregular heart rhythm: x       Vascular    Pain in calf, thigh, or hip brought on by ambulation:    Pain in feet at night that wakes you up from your sleep:     Blood clot in your veins:    Leg swelling:  x       Pulmonary    Oxygen at home:    Productive cough:     Wheezing:         Neurologic    Sudden weakness in arms or legs:     Sudden numbness in arms or legs:     Sudden onset of difficulty speaking or slurred speech:    Temporary loss of vision in one eye:     Problems with dizziness:         Gastrointestinal    Blood in stool:      Vomited blood:         Genitourinary    Burning when urinating:     Blood in urine:        Psychiatric    Major depression:         Hematologic    Bleeding problems:    Problems with blood clotting too easily:        Skin    Rashes or ulcers:        Constitutional    Fever  or chills:     PHYSICAL EXAM:   There were no vitals filed for this visit.  GENERAL: The patient is a  well-nourished female, in no acute distress. The vital signs are documented above. CARDIAC: There is a regular rate and rhythm.  VASCULAR: Palpable pedal pulses bilaterally.  Trace edema. PULMONARY: Nonlabored respirations  MUSCULOSKELETAL: There are no major deformities or cyanosis. NEUROLOGIC: No focal weakness or paresthesias are detected. SKIN: There are no ulcers or rashes noted. PSYCHIATRIC: The patient has a normal affect.  STUDIES:   I have reviewed the following:  +-------+-----------+-----------+------------+------------+ ABI/TBIToday's ABIToday's TBIPrevious ABIPrevious TBI +-------+-----------+-----------+------------+------------+ Right  1.02       0.76                                +-------+-----------+-----------+------------+------------+ Left   0.97       0.91                                +-------+-----------+-----------+------------+------------+  left toe pressure:  122 Right toe pressure:  146  Carotid duplex (2018): Bilateral: intimal wall thickening CCA. Mild mixed plaque origin ICA. 1-39% ICA plaquing. Vertebral artery flow is antegrade.  ASSESSMENT and PLAN   I discussed with the patient that she has had a normal vascular ultrasound today, and that along with palpable pulses reassures me that she does not have any evidence of vascular disease in her lower extremities.  No further treatment is recommended at this time.  She could be reevaluated if she were to develop symptoms in the future.  Patient does have trace edema in her legs.  I am giving her information about compression stockings should she choose to wear them.   Charlena CrossWells Susi Goslin, IV, MD, FACS [facility] Tel [phone] Pager [phone]

## 2018-09-12 ENCOUNTER — Other Ambulatory Visit: Payer: Self-pay

## 2018-09-12 ENCOUNTER — Ambulatory Visit
Admission: RE | Admit: 2018-09-12 | Discharge: 2018-09-12 | Disposition: A | Discharge status: Home / Self Care | Payer: Medicare Other | Source: Ambulatory Visit | Attending: Family Medicine | Admitting: Family Medicine

## 2018-09-12 DIAGNOSIS — Z1231 Encounter for screening mammogram for malignant neoplasm of breast: Secondary | ICD-10-CM

## 2018-12-25 ENCOUNTER — Telehealth: Payer: Self-pay | Admitting: Adult Health

## 2018-12-25 NOTE — Telephone Encounter (Signed)
FYI-Patients appt on 01/08/2019 has been changed to a mychart video visit on 01/15/2019 due to husband being in chemo

## 2018-12-25 NOTE — Telephone Encounter (Signed)
Noted  

## 2019-01-08 ENCOUNTER — Ambulatory Visit: Payer: Medicare Other | Admitting: Adult Health

## 2019-01-11 ENCOUNTER — Ambulatory Visit: Payer: Medicare Other | Admitting: Adult Health

## 2019-01-15 ENCOUNTER — Telehealth: Payer: Medicare Other | Admitting: Adult Health

## 2019-01-15 NOTE — Progress Notes (Deleted)
[facility]  PATIENT: Carol Gamble DOB: 1941/01/01   REASON FOR VISIT: follow-up for admission for word finding difficulty inability to express thoughts  Seizure disorder, recently diagnosed with atrial fibrillation HISTORY FROM: Patient     HISTORY OF PRESENT ILLNESS:   Virtual visit 01/15/2019: Carol Gamble is a 78 year old female who is being seen today via virtual visit for yearly follow-up regarding seizures disorder.  Continues on Keppra 500 mg twice daily without seizure activity and tolerating well.  Continues on Eliquis without side effects for history of atrial fibrillation.  She also remains on simvastatin for HLD without myalgias.  Blood pressure ***.   UPDATE 12/4/2019[name], 78 year old female returns for follow-up with history of seizure disorder.  EEG in the past showed mild cortical dysfunction left temporal region.  MRI of the brain and MRA were normal she remains on Keppra 500 mg twice daily without further seizure events.  She was found to be in atrial fib in May 2019 prior to having knee surgery.  She is now on Eliquis with minimal bruising and no bleeding.  She remains on Zocor without myalgias.  She remains independent in all activities of daily living, driving a vehicle without difficulty.  She returns for reevaluation   UPDATE 2/4/2019[name], 78 year old female returns for follow-up history of seizure disorder.  She has had episodes of word finding difficulty and inability to express her thoughts MRI of the brain and MRA were normal but EEG showed mild cortical dysfunction left temporal region.  She remains on Keppra 500 twice daily without further seizure events.  In addition she is on aspirin for secondary stroke prevention along with Zocor.  She is independent in activities of daily living.  She continues to drive without difficulty.  She returns for reevaluation    UPDATE  08/02/2018CM  Carol Gamble,  78 year-old female  returns for follow-up seizure disorder. She had episode of word finding difficulty, and inability to express her thoughts. MRI and MRA were normal. EEG mild cortical dysfunction, left temporal region. She is currently on Keppra 500 twice daily without side effects and further seizure activity. She remains on aspirin for secondary stroke prevention and Zocor for hyperlipidemia. She returns for reevaluation HISTORY 05/03/16 Carol Gamble, 78 year old female with history of hypertension and asthma palpitations renal calculi headaches arthritis presenting to the hospital with speech difficulties staring episodes and unresponsiveness and postictal confusion with right hemianopsia episodes. MRI normal MRA normal MRI with contrast subtle swelling and blurring of left-campus suspected to be related to seizure. EEG mild cortical dysfunction in the left temporal region. Carotid Doppler 1-39% ICA plaquing 2-D echo EF 55-60%. LDL 97. Hemoglobin A1c 5.6. Patient was placed on Keppra 500 twice a day. She returns to the office today for hospital follow-up. She has not had further episodes of seizure activity. She is currently on Keppra 500 twice daily without side effects. She is also on aspirin 0.81 for secondary stroke prevention. She is on Zocor for hyperlipidemia without complaints of myalgias. Blood pressure in the office today 126/75. She returns for reevaluation   REVIEW OF SYSTEMS: Full 14 system review of systems performed and notable only for those listed, all others are neg:  Constitutional: neg Cardiovascular: neg Ear/Nose/Throat: Hearing loss  Skin: neg Eyes: Blurred vision Respiratory: neg Gastroitestinal: neg  Hematology/Lymphatic: neg  Endocrine: neg Musculoskeletal:neg Allergy/Immunology: neg Neurological: Seizure disorder Psychiatric: neg Sleep : neg   ALLERGIES: Allergies  Allergen Reactions  . Propofol Nausea And  Vomiting and Other (See Comments)    Difficulty waking up from anesthesia.  Can't breath.  Marland Kitchen Shrimp [Shellfish Allergy] Hives    HOME MEDICATIONS: Outpatient Medications Prior to Visit  Medication Sig Dispense Refill  . apixaban (ELIQUIS) 5 MG TABS tablet Take 5 mg by mouth 2 (two) times daily.    Marland Kitchen atenolol (TENORMIN) 50 MG tablet Take 50 mg by mouth daily with breakfast.    . CALCIUM-VITAMIN D PO Take 1 tablet by mouth 2 (two) times daily.    . diphenhydramine-acetaminophen (TYLENOL PM) 25-500 MG TABS tablet Take 1 tablet by mouth at bedtime as needed (sleep).     . fluticasone (FLONASE) 50 MCG/ACT nasal spray Place 2 sprays into both nostrils daily.     . hydroxypropyl methylcellulose (ISOPTO TEARS) 2.5 % ophthalmic solution Place 2 drops into both eyes 3 (three) times daily as needed for dry eyes.     Marland Kitchen levETIRAcetam (KEPPRA) 500 MG tablet Take 1 tablet (500 mg total) by mouth 2 (two) times daily. 180 tablet 3  . lisinopril-hydrochlorothiazide (PRINZIDE,ZESTORETIC) 20-12.5 MG per tablet Take 1 tablet by mouth daily with breakfast.    . Multiple Vitamin (MULTIVITAMIN WITH MINERALS) TABS Take 1 tablet by mouth daily.    . Omega-3 Fatty Acids (FISH OIL) 1200 MG CPDR Take 1 capsule by mouth 2 (two) times daily.    Marland Kitchen omeprazole (PRILOSEC) 40 MG capsule Take 40 mg by mouth at bedtime.    . simvastatin (ZOCOR) 40 MG tablet Take 40 mg by mouth at bedtime.     No facility-administered medications prior to visit.    PAST MEDICAL HISTORY: Past Medical History:  Diagnosis Date  . Arthritis    HNP- low back   . Complication of anesthesia    pt. reports that when she wakes up she had a hard time catching her breath & the PACU team told her that her BP & pulse were not doing what it was suppose to do  . Dyslipidemia 02/27/2016  . GERD (gastroesophageal reflux disease)   . Headache(784.0)   . History of asthma    childhood, age 82  . History of kidney stones about 2008ish  . HNP (herniated nucleus pulposus), lumbar 06/30/2012  . Hypertension    states she always has  palpitations   . PONV (postoperative nausea and vomiting)   . Seizure ([facility])   . Seizures ([facility]) 02/2016  . Stroke-like symptoms 02/27/2016    PAST SURGICAL HISTORY: Past Surgical History:  Procedure Laterality Date  . ABDOMINAL HYSTERECTOMY     1993  . BACK SURGERY     2000,01,10  . CYSTOSCOPY/RETROGRADE/URETEROSCOPY/STONE EXTRACTION WITH BASKET Left 03/01/2013   Procedure: CYSTOSCOPY/RETROGRADE/URETEROSCOPY/STONE EXTRACTION WITH BASKET/INSERTION LEFT DOUBLE  J STENT;  Surgeon: Su Grand, MD;  Location: [facility];  Service: Urology;  Laterality: Left;  . FINGER MASS EXCISION  2008  . FOOT TENDON SURGERY  1985  . HOLMIUM LASER APPLICATION Left 03/01/2013   Procedure: HOLMIUM LASER APPLICATION;  Surgeon: Su Grand, MD;  Location: [facility];  Service: Urology;  Laterality: Left;  . INNER EAR SURGERY     x2  . PARTIAL KNEE ARTHROPLASTY Left 10/04/2017   Procedure: UNICOMPARTMENTAL LEFT  KNEE;  Surgeon: Sheral Apley, MD;  Location: [facility];  Service: Orthopedics;  Laterality: Left;  Adductor Block  . ROTATOR CUFF REPAIR     both shoulders done, last one - 2010  . TONSILLECTOMY      FAMILY HISTORY: Family History  Problem Relation Age  of Onset  . Alzheimer's disease Mother   . Aneurysm Brother        brain, age 77    SOCIAL HISTORY: Social History   Socioeconomic History  . Marital status: Married    Spouse name: Reuel BoomDaniel  . Number of children: 3  . Years of education: 9.5  . Highest education level: Not on file  Occupational History    Comment: retired  Tobacco Use  . Smoking status: Passive Smoke Exposure - Never Smoker  . Smokeless tobacco: Never Used  Substance and Sexual Activity  . Alcohol use: No  . Drug use: No  . Sexual activity: Not on file  Other Topics Concern  . Not on file  Social History Narrative   Lives with husband   Caffeine- coffee, 2-3 cups daily   Social Determinants of Health   Financial Resource Strain:   . Difficulty of Paying Living Expenses:  Not on file  Food Insecurity:   . Worried About Programme researcher, broadcasting/film/videounning Out of Food in the Last Year: Not on file  . Ran Out of Food in the Last Year: Not on file  Transportation Needs:   . Lack of Transportation (Medical): Not on file  . Lack of Transportation (Non-Medical): Not on file  Physical Activity:   . Days of Exercise per Week: Not on file  . Minutes of Exercise per Session: Not on file  Stress:   . Feeling of Stress : Not on file  Social Connections:   . Frequency of Communication with Friends and Family: Not on file  . Frequency of Social Gatherings with Friends and Family: Not on file  . Attends Religious Services: Not on file  . Active Member of Clubs or Organizations: Not on file  . Attends BankerClub or Organization Meetings: Not on file  . Marital Status: Not on file  Intimate Partner Violence:   . Fear of Current or Ex-Partner: Not on file  . Emotionally Abused: Not on file  . Physically Abused: Not on file  . Sexually Abused: Not on file     PHYSICAL EXAM  There were no vitals filed for this visit. There is no height or weight on file to calculate BMI.  Generalized: Well developed, Obese female in no acute distress  Head: normocephalic and atraumatic,. Oropharynx benign  Neck: Supple, no carotid bruits  Cardiac: Irregularly irregular rate rhythm, no murmur  Musculoskeletal: No deformity   Neurological examination   Mentation: Alert oriented to time, place, history taking. Attention span and concentration appropriate. Recent and remote memory intact.  Follows all commands speech and language fluent.   Cranial nerve II-XII: .Pupils were equal round reactive to light extraocular movements were full, visual field were full on confrontational test. Facial sensation and strength were normal. hearing was intact to finger rubbing bilaterally. Uvula tongue midline. head turning and shoulder shrug were normal and symmetric.Tongue protrusion into cheek strength was normal. Motor: normal  bulk and tone, full strength in the BUE, BLE, fine finger movements normal, no pronator drift. No focal weakness Sensory: normal and symmetric to light touch,  Coordination: finger-nose-finger, heel-to-shin bilaterally, no dysmetria Reflexes: 1+ upper lower and symmetric, plantar responses were flexor bilaterally. Gait and Station: Rising up from seated position without assistance, normal stance,  moderate stride, good arm swing, smooth turning, ambulates with single-point cane DIAGNOSTIC DATA (LABS, IMAGING, TESTING) - I reviewed patient records, labs, notes, testing and imaging myself where available.  Lab Results  Component Value Date   WBC 5.8 09/28/2017  HGB 14.7 09/28/2017   HCT 43.5 09/28/2017   MCV 91.6 09/28/2017   PLT 249 09/28/2017      Component Value Date/Time   NA 146 (H) 10/04/2017 0917   K 3.5 10/04/2017 0917   CL 109 10/04/2017 0917   CO2 26 10/04/2017 0917   GLUCOSE 115 (H) 10/04/2017 0917   BUN 24 (H) 10/04/2017 0917   CREATININE 0.89 10/04/2017 0917   CALCIUM 9.6 10/04/2017 0917   PROT 6.9 02/27/2016 1929   ALBUMIN 4.1 02/27/2016 1929   AST 18 02/27/2016 1929   ALT 18 02/27/2016 1929   ALKPHOS 56 02/27/2016 1929   BILITOT 0.6 02/27/2016 1929   GFRNONAA >60 10/04/2017 0917   GFRAA >60 10/04/2017 0917   Lab Results  Component Value Date   CHOL 180 02/28/2016   HDL 62 02/28/2016   LDLCALC 97 02/28/2016   TRIG 105 02/28/2016   CHOLHDL 2.9 02/28/2016   Lab Results  Component Value Date   HGBA1C 5.6 02/28/2016    ASSESSMENT AND PLAN  78 y.o. year old female  has a past medical history of history of hypertension and asthma palpitations renal calculi headaches arthritis presenting to the hospital with speech difficulties staring episodes and unresponsiveness and postictal confusion with right hemianopsia episodes. MRI normal MRA normal MRI with contrast subtle swelling and blurring of left-campus suspected to be related to seizure. EEG mild cortical  dysfunction in the left temporal region. Carotid Doppler 1-39% ICA plaquing 2-D echo EF 55-60%. LDL 97. Hemoglobin A1c 5.6.  EKG May 2019 with atrial fibrillation.  Now on Eliquis The patient is a current patient of Dr. Erlinda Hong  who has left our practice.  This note is sent to the work in doctor.     Continue Keppra 500mg  twice daily will reorder Continue Eliquis  for secondary stroke prevention and atrial fibrillation Continue Zocor for hyperlipidemia Keep B/P less than 130/80  todays reading 126/68 Call for seizure activity Follow up  Yearly  I spent 20 minutes in total face to face time with the patient more than 50% of which was spent counseling and coordination of care, reviewing test results reviewing medications and discussing and reviewing the diagnosis of seizure disorder and precautions and further treatment options.  Patient encouraged to be more physically active and lose weight.,  Frann Rider, AGNP-BC  [facility] [address] [place], [place] [address]  Phone [phone] Fax [phone] Note: This document was prepared with digital dictation and possible smart phrase technology. Any transcriptional errors that result from this process are unintentional.

## 2019-01-16 ENCOUNTER — Other Ambulatory Visit: Payer: Self-pay

## 2019-01-16 ENCOUNTER — Encounter: Payer: Self-pay | Admitting: Adult Health

## 2019-01-16 ENCOUNTER — Ambulatory Visit: Payer: Medicare Other | Admitting: Adult Health

## 2019-01-16 VITALS — BP 150/75 | HR 73 | Temp 97.7°F | Ht 62.0 in | Wt 220.2 lb

## 2019-01-16 DIAGNOSIS — I480 Paroxysmal atrial fibrillation: Secondary | ICD-10-CM

## 2019-01-16 DIAGNOSIS — I10 Essential (primary) hypertension: Secondary | ICD-10-CM | POA: Diagnosis not present

## 2019-01-16 DIAGNOSIS — R569 Unspecified convulsions: Secondary | ICD-10-CM | POA: Diagnosis not present

## 2019-01-16 NOTE — Patient Instructions (Addendum)
Continue Eliquis (apixaban) daily  and simvastatin for stroke prevention  Continue on Keppra 500 mg twice daily for seizure prophylaxis -refill provided  Continue to follow up with PCP regarding cholesterol and blood pressure management   Continue to follow with cardiology for atrial fibrillation and Eliquis management  Continue to monitor blood pressure at home   Followup in the future with me in 1 year or call earlier if needed       Thank you for coming to see [place] at [facility]. I hope we have been able to provide you high quality care today.  You may receive a patient satisfaction survey over the next few weeks. We would appreciate your feedback and comments so that we may continue to improve ourselves and the health of our patients.

## 2019-01-16 NOTE — Progress Notes (Signed)
[facility]  PATIENT: Carol Gamble DOB: Jun 11, 1940   REASON FOR VISIT: follow-up for admission for word finding difficulty inability to express thoughts  Seizure disorder, recently diagnosed with atrial fibrillation HISTORY FROM: Patient     HISTORY OF PRESENT ILLNESS:   Virtual visit 01/15/2019: [name] is a 78 year old female who is being seen today via virtual visit for yearly follow-up regarding seizures disorder.  Continues on Keppra 500 mg twice daily without seizure activity and tolerating well.  Continues on Eliquis without side effects for history of atrial fibrillation.  She also remains on simvastatin for HLD without myalgias.  Blood pressure 150/75.  She remains independent of all ADLs and IADLs.  No concerns at this time.  UPDATE 12/4/2019[name], 78 year old female returns for follow-up with history of seizure disorder.  EEG in the past showed mild cortical dysfunction left temporal region.  MRI of the brain and MRA were normal she remains on Keppra 500 mg twice daily without further seizure events.  She was found to be in atrial fib in May 2019 prior to having knee surgery.  She is now on Eliquis with minimal bruising and no bleeding.  She remains on Zocor without myalgias.  She remains independent in all activities of daily living, driving a vehicle without difficulty.  She returns for reevaluation   UPDATE 2/4/2019[name], 78 year old female returns for follow-up history of seizure disorder.  She has had episodes of word finding difficulty and inability to express her thoughts MRI of the brain and MRA were normal but EEG showed mild cortical dysfunction left temporal region.  She remains on Keppra 500 twice daily without further seizure events.  In addition she is on aspirin for secondary stroke prevention along with Zocor.  She is independent in activities of daily living.  She continues to drive without difficulty.  She returns for  reevaluation    UPDATE  08/02/2018CM  Carol Gamble,  78 year-old female returns for follow-up seizure disorder. She had episode of word finding difficulty, and inability to express her thoughts. MRI and MRA were normal. EEG mild cortical dysfunction, left temporal region. She is currently on Keppra 500 twice daily without side effects and further seizure activity. She remains on aspirin for secondary stroke prevention and Zocor for hyperlipidemia. She returns for reevaluation HISTORY 05/03/16 [name], 78 year old female with history of hypertension and asthma palpitations renal calculi headaches arthritis presenting to the hospital with speech difficulties staring episodes and unresponsiveness and postictal confusion with right hemianopsia episodes. MRI normal MRA normal MRI with contrast subtle swelling and blurring of left-campus suspected to be related to seizure. EEG mild cortical dysfunction in the left temporal region. Carotid Doppler 1-39% ICA plaquing 2-D echo EF 55-60%. LDL 97. Hemoglobin A1c 5.6. Patient was placed on Keppra 500 twice a day. She returns to the office today for hospital follow-up. She has not had further episodes of seizure activity. She is currently on Keppra 500 twice daily without side effects. She is also on aspirin 0.81 for secondary stroke prevention. She is on Zocor for hyperlipidemia without complaints of myalgias. Blood pressure in the office today 126/75. She returns for reevaluation   REVIEW OF SYSTEMS: Full 14 system review of systems performed and notable only for those listed, all others are neg:  Constitutional: neg Cardiovascular: neg Ear/Nose/Throat: Hearing loss  Skin: neg Eyes: Blurred vision Respiratory: neg Gastroitestinal: neg  Hematology/Lymphatic: neg  Endocrine: neg Musculoskeletal:neg Allergy/Immunology: neg Neurological: Seizure disorder Psychiatric: neg Sleep :  neg   ALLERGIES: Allergies  Allergen Reactions  . Propofol Nausea And  Vomiting and Other (See Comments)    Difficulty waking up from anesthesia. Can't breath.  Marland Kitchen Shrimp [Shellfish Allergy] Hives    HOME MEDICATIONS: Outpatient Medications Prior to Visit  Medication Sig Dispense Refill  . apixaban (ELIQUIS) 5 MG TABS tablet Take 5 mg by mouth 2 (two) times daily.    Marland Kitchen atenolol (TENORMIN) 50 MG tablet Take 50 mg by mouth daily with breakfast.    . CALCIUM-VITAMIN D PO Take 1 tablet by mouth 2 (two) times daily.    . diphenhydramine-acetaminophen (TYLENOL PM) 25-500 MG TABS tablet Take 1 tablet by mouth at bedtime as needed (sleep).     . fluticasone (FLONASE) 50 MCG/ACT nasal spray Place 2 sprays into both nostrils daily.     . hydroxypropyl methylcellulose (ISOPTO TEARS) 2.5 % ophthalmic solution Place 2 drops into both eyes 3 (three) times daily as needed for dry eyes.     Marland Kitchen levETIRAcetam (KEPPRA) 500 MG tablet Take 1 tablet (500 mg total) by mouth 2 (two) times daily. 180 tablet 3  . lisinopril-hydrochlorothiazide (PRINZIDE,ZESTORETIC) 20-12.5 MG per tablet Take 1 tablet by mouth daily with breakfast.    . Multiple Vitamin (MULTIVITAMIN WITH MINERALS) TABS Take 1 tablet by mouth daily.    . Omega-3 Fatty Acids (FISH OIL) 1200 MG CPDR Take 1 capsule by mouth 2 (two) times daily.    Marland Kitchen omeprazole (PRILOSEC) 40 MG capsule Take 40 mg by mouth at bedtime.    . simvastatin (ZOCOR) 40 MG tablet Take 40 mg by mouth at bedtime.     No facility-administered medications prior to visit.    PAST MEDICAL HISTORY: Past Medical History:  Diagnosis Date  . Arthritis    HNP- low back   . Complication of anesthesia    pt. reports that when she wakes up she had a hard time catching her breath & the PACU team told her that her BP & pulse were not doing what it was suppose to do  . Dyslipidemia 02/27/2016  . GERD (gastroesophageal reflux disease)   . Headache(784.0)   . History of asthma    childhood, age 79  . History of kidney stones about 2008ish  . HNP (herniated  nucleus pulposus), lumbar 06/30/2012  . Hypertension    states she always has palpitations   . PONV (postoperative nausea and vomiting)   . Seizure (HCC)   . Seizures (HCC) 02/2016  . Stroke-like symptoms 02/27/2016    PAST SURGICAL HISTORY: Past Surgical History:  Procedure Laterality Date  . ABDOMINAL HYSTERECTOMY     1993  . BACK SURGERY     2000,01,10  . CYSTOSCOPY/RETROGRADE/URETEROSCOPY/STONE EXTRACTION WITH BASKET Left 03/01/2013   Procedure: CYSTOSCOPY/RETROGRADE/URETEROSCOPY/STONE EXTRACTION WITH BASKET/INSERTION LEFT DOUBLE  J STENT;  Surgeon: Su Grand, MD;  Location: [facility];  Service: Urology;  Laterality: Left;  . FINGER MASS EXCISION  2008  . FOOT TENDON SURGERY  1985  . HOLMIUM LASER APPLICATION Left 03/01/2013   Procedure: HOLMIUM LASER APPLICATION;  Surgeon: Su Grand, MD;  Location: [facility];  Service: Urology;  Laterality: Left;  . INNER EAR SURGERY     x2  . PARTIAL KNEE ARTHROPLASTY Left 10/04/2017   Procedure: UNICOMPARTMENTAL LEFT  KNEE;  Surgeon: Sheral Apley, MD;  Location: [facility];  Service: Orthopedics;  Laterality: Left;  Adductor Block  . ROTATOR CUFF REPAIR     both shoulders done, last one - 2010  . TONSILLECTOMY  FAMILY HISTORY: Family History  Problem Relation Age of Onset  . Alzheimer's disease Mother   . Aneurysm Brother        brain, age 47    SOCIAL HISTORY: Social History   Socioeconomic History  . Marital status: Married    Spouse name: [name]  . Number of children: 3  . Years of education: 9.5  . Highest education level: Not on file  Occupational History    Comment: retired  Tobacco Use  . Smoking status: Passive Smoke Exposure - Never Smoker  . Smokeless tobacco: Never Used  Substance and Sexual Activity  . Alcohol use: No  . Drug use: No  . Sexual activity: Not on file  Other Topics Concern  . Not on file  Social History Narrative   Lives with husband   Caffeine- coffee, 2-3 cups daily   Social Determinants of  Health   Financial Resource Strain:   . Difficulty of Paying Living Expenses: Not on file  Food Insecurity:   . Worried About Programme researcher, broadcasting/film/video in the Last Year: Not on file  . Ran Out of Food in the Last Year: Not on file  Transportation Needs:   . Lack of Transportation (Medical): Not on file  . Lack of Transportation (Non-Medical): Not on file  Physical Activity:   . Days of Exercise per Week: Not on file  . Minutes of Exercise per Session: Not on file  Stress:   . Feeling of Stress : Not on file  Social Connections:   . Frequency of Communication with Friends and Family: Not on file  . Frequency of Social Gatherings with Friends and Family: Not on file  . Attends Religious Services: Not on file  . Active Member of Clubs or Organizations: Not on file  . Attends Banker Meetings: Not on file  . Marital Status: Not on file  Intimate Partner Violence:   . Fear of Current or Ex-Partner: Not on file  . Emotionally Abused: Not on file  . Physically Abused: Not on file  . Sexually Abused: Not on file     PHYSICAL EXAM  There were no vitals filed for this visit. There is no height or weight on file to calculate BMI.  Generalized: Well developed, Obese female in no acute distress  Head: normocephalic and atraumatic,. Oropharynx benign  Neck: Supple, no carotid bruits  Cardiac: Irregularly irregular rate rhythm, no murmur  Musculoskeletal: No deformity   Neurological examination   Mentation: Alert oriented to time, place, history taking. Attention span and concentration appropriate. Recent and remote memory intact.  Follows all commands speech and language fluent.   Cranial nerve II-XII: .Pupils were equal round reactive to light extraocular movements were full, visual field were full on confrontational test. Facial sensation and strength were normal. hearing was intact to finger rubbing bilaterally. Uvula tongue midline. head turning and shoulder shrug were  normal and symmetric.Tongue protrusion into cheek strength was normal. Motor: normal bulk and tone, full strength in the BUE, BLE, fine finger movements normal, no pronator drift. No focal weakness Sensory: normal and symmetric to light touch,  Coordination: finger-nose-finger, heel-to-shin bilaterally, no dysmetria Reflexes: 1+ upper lower and symmetric, plantar responses were flexor bilaterally. Gait and Station: Rising up from seated position without assistance, normal stance,  moderate stride, good arm swing, smooth turning, ambulates with single-point cane DIAGNOSTIC DATA (LABS, IMAGING, TESTING) - I reviewed patient records, labs, notes, testing and imaging myself where available.  Lab Results  Component Value Date   WBC 5.8 09/28/2017   HGB 14.7 09/28/2017   HCT 43.5 09/28/2017   MCV 91.6 09/28/2017   PLT 249 09/28/2017      Component Value Date/Time   NA 146 (H) 10/04/2017 0917   K 3.5 10/04/2017 0917   CL 109 10/04/2017 0917   CO2 26 10/04/2017 0917   GLUCOSE 115 (H) 10/04/2017 0917   BUN 24 (H) 10/04/2017 0917   CREATININE 0.89 10/04/2017 0917   CALCIUM 9.6 10/04/2017 0917   PROT 6.9 02/27/2016 1929   ALBUMIN 4.1 02/27/2016 1929   AST 18 02/27/2016 1929   ALT 18 02/27/2016 1929   ALKPHOS 56 02/27/2016 1929   BILITOT 0.6 02/27/2016 1929   GFRNONAA >60 10/04/2017 0917   GFRAA >60 10/04/2017 0917   Lab Results  Component Value Date   CHOL 180 02/28/2016   HDL 62 02/28/2016   LDLCALC 97 02/28/2016   TRIG 105 02/28/2016   CHOLHDL 2.9 02/28/2016   Lab Results  Component Value Date   HGBA1C 5.6 02/28/2016    ASSESSMENT AND PLAN  78 y.o. year old female  has a past medical history of history of hypertension and asthma palpitations renal calculi headaches arthritis presenting to the hospital with speech difficulties staring episodes and unresponsiveness and postictal confusion with right hemianopsia episodes. MRI normal MRA normal MRI with contrast subtle swelling  and blurring of left-campus suspected to be related to seizure. EEG mild cortical dysfunction in the left temporal region. Carotid Doppler 1-39% ICA plaquing 2-D echo EF 55-60%. LDL 97. Hemoglobin A1c 5.6.  EKG May 2019 with atrial fibrillation therefore Eliquis initiated.  She has not had any reoccurring seizure activity or symptoms with ongoing use of Keppra   Continue Keppra 500mg  twice daily for seizure prophylaxis -patient will call office when refill required Continue Eliquis  for secondary stroke prevention and atrial fibrillation Continue Zocor for hyperlipidemia Keep B/P less than 130/80  todays reading 126/68 Call for seizure activity or concerns regarding Keppra  Follow-up in 1 year or call earlier if needed  I spent 20 minutes in total face to face time with the patient more than 50% of which was spent counseling and coordination of care, reviewing medications and discussing and reviewing the diagnosis of seizure disorder and precautions and further treatment options and answered all questions to patient satisfaction  Frann Rider, AGNP-BC  [facility] [address] [place], [place] [address]  Phone [phone] Fax [phone] Note: This document was prepared with digital dictation and possible smart phrase technology. Any transcriptional errors that result from this process are unintentional.

## 2019-01-16 NOTE — Progress Notes (Signed)
I agree with the above plan 

## 2019-02-05 ENCOUNTER — Telehealth: Payer: Self-pay | Admitting: *Deleted

## 2019-02-05 ENCOUNTER — Other Ambulatory Visit: Payer: Self-pay | Admitting: *Deleted

## 2019-02-05 DIAGNOSIS — I480 Paroxysmal atrial fibrillation: Secondary | ICD-10-CM

## 2019-02-05 NOTE — Telephone Encounter (Signed)
3 day ZIO XT long term holter monitor to be shipped to the patients home.  Instructions reviewed briefly as they are included in the monitor kit.

## 2019-02-08 ENCOUNTER — Telehealth: Payer: Self-pay | Admitting: Adult Health

## 2019-02-08 ENCOUNTER — Other Ambulatory Visit (INDEPENDENT_AMBULATORY_CARE_PROVIDER_SITE_OTHER): Payer: Medicare Other

## 2019-02-08 DIAGNOSIS — I480 Paroxysmal atrial fibrillation: Secondary | ICD-10-CM | POA: Diagnosis not present

## 2019-02-08 MED ORDER — LEVETIRACETAM 500 MG PO TABS: 500.0000 mg | ORAL_TABLET | Freq: Two times a day (BID) | ORAL | 3 refills | Status: DC

## 2019-02-08 NOTE — Telephone Encounter (Signed)
Pt is requesting a refill of levETIRAcetam (KEPPRA) 500 MG tablet, to be sent to [id number] [facility], [place] - [address]

## 2019-02-08 NOTE — Telephone Encounter (Signed)
Done

## 2019-03-02 ENCOUNTER — Telehealth: Payer: Self-pay | Admitting: *Deleted

## 2019-03-02 MED ORDER — ATENOLOL 50 MG PO TABS: 75.0000 mg | ORAL_TABLET | Freq: Every day | ORAL | 3 refills | Status: DC

## 2019-03-02 NOTE — Telephone Encounter (Signed)
-----   Message from Lyn Records, MD sent at 03/02/2019  1:19 PM EST ----- Let the patient know the heart rate is faster than desired.  Atrial fibrillation is present continuously.  Increase atenolol to 75 mg/day. A copy will be sent to Blair Heys, MD

## 2019-03-02 NOTE — Telephone Encounter (Signed)
Spoke with pt and reviewed results and recommendations.  Advised pt to contact the office BP or HR is getting too low.  Advised parameters on when to call.  Pt verbalized understanding and was in agreement with this plan.

## 2019-03-13 ENCOUNTER — Other Ambulatory Visit: Payer: Self-pay | Admitting: Interventional Cardiology

## 2019-03-13 MED ORDER — ATENOLOL 50 MG PO TABS: 75.0000 mg | ORAL_TABLET | Freq: Every day | ORAL | 0 refills | Status: DC

## 2019-05-13 IMAGING — DX DG KNEE 1-2V*L*
2 series · 2 of 2 positions shown · non-contrast
Comparison: None.

CLINICAL DATA: Postop knee.

EXAM:
LEFT KNEE - 1-2 VIEW

[knee ap]
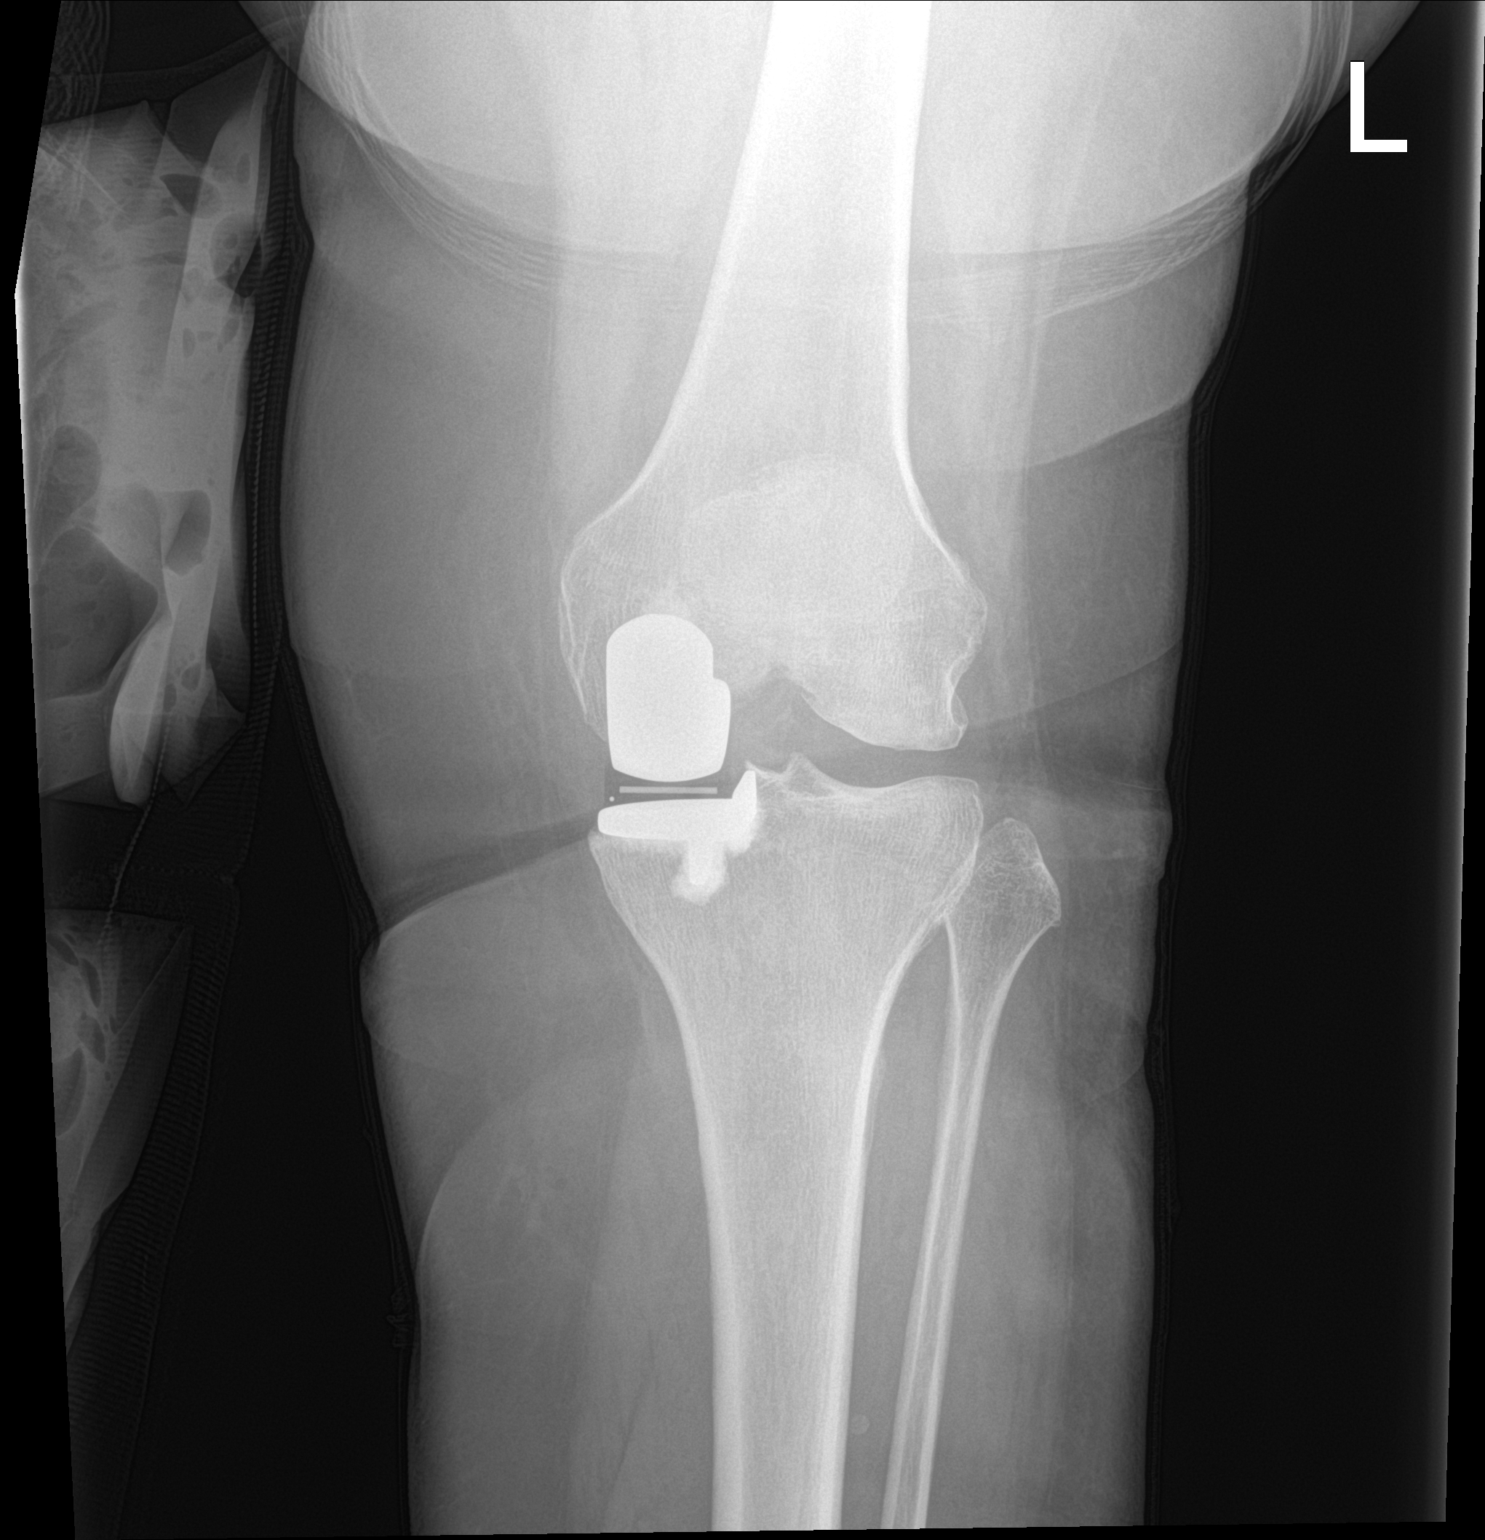

[knee lat]
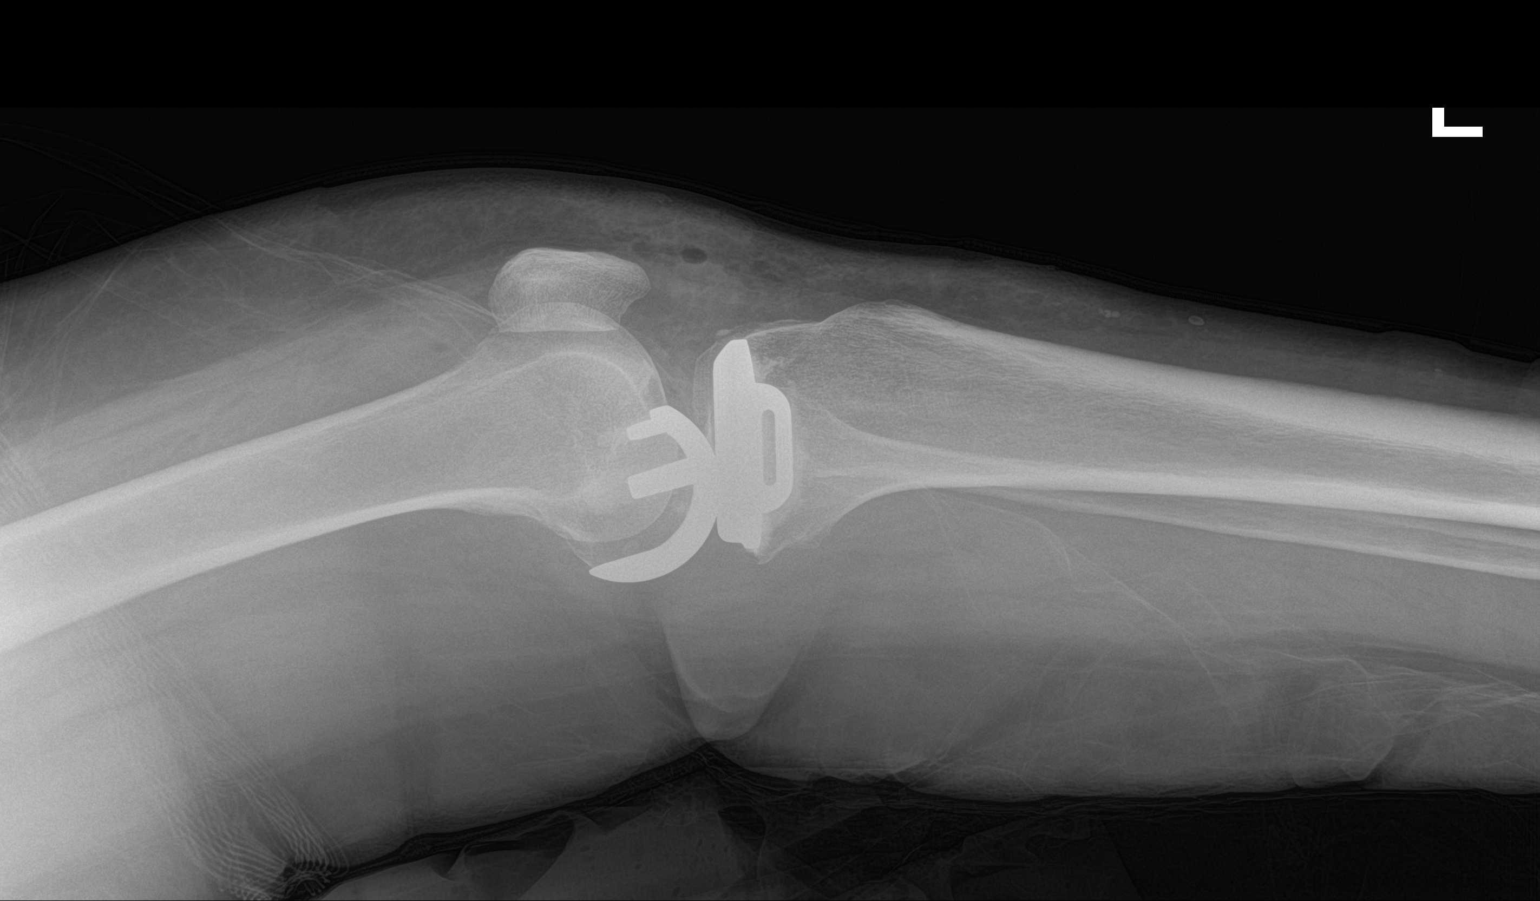

[2 of 2 positions shown; findings below may reference images not displayed]

FINDINGS: Patient is status post medial hemiarthroplasty. The femoral and
tibial components are in good position. Postoperative air seen in
the soft tissues.
IMPRESSION: Postoperative changes as above.

## 2019-05-24 ENCOUNTER — Other Ambulatory Visit: Payer: Self-pay | Admitting: Interventional Cardiology

## 2019-06-12 NOTE — Progress Notes (Signed)
CARDIOLOGY OFFICE NOTE  Date:  06/13/2019    Carol Gamble Date of Birth: 04/14/1940 Medical Record #[id number]  PCP:  Blair Heys, MD  Cardiologist:  Tyrone Sage & Smith/Nishan  Chief Complaint  Patient presents with  . Follow-up    Seen for Dr. Katrinka Blazing    History of Present Illness: Carol Gamble is a 79 y.o. female who presents today for a one year check. Seen for Dr. Katrinka Blazing. She has seen Dr. Eden Emms in the past as well. Dr. Katrinka Blazing sees her husband, Carol Gamble, which is why she has switched.   She has persistent AF, HTN, morbid obesity and anticoagulation therapy.   She was last seen a year ago with a telehealth visit with Dr. Katrinka Blazing - she was felt to be doing well. Beta blocker was increased back in January of this year due to higher heart rates noted on monitor. She is in chronic AF.   The patient does not have symptoms concerning for COVID-19 infection (fever, chills, cough, or new shortness of breath).   Comes in today. Here alone. We see her husband next week. She had COVID back in October. This did cause worsening palpitations. This is what led to the monitor. Beta blocker was increased and this has caused dramatic improvement with her HR. She still does not have much energy since having COVID - this has not improved. She is pretty sedentary. Her back limits her. She has gained weight. No chest pain. Breathing is ok. She feels like other than the weight gain that she is doing ok. She was not eligible for any help with the cost of her Eliquis - but her husband is probably going to get some assistance which will then help her out.   Past Medical History:  Diagnosis Date  . Arthritis    HNP- low back   . Complication of anesthesia    pt. reports that when she wakes up she had a hard time catching her breath & the PACU team told her that her BP & pulse were not doing what it was suppose to do  . Dyslipidemia 02/27/2016  . GERD (gastroesophageal reflux disease)   .  Headache(784.0)   . History of asthma    childhood, age 39  . History of kidney stones about 2008ish  . HNP (herniated nucleus pulposus), lumbar 06/30/2012  . Hypertension    states she always has palpitations   . PONV (postoperative nausea and vomiting)   . Seizure (HCC)   . Seizures (HCC) 02/2016  . Stroke-like symptoms 02/27/2016    Past Surgical History:  Procedure Laterality Date  . ABDOMINAL HYSTERECTOMY     1993  . BACK SURGERY     2000,01,10  . CYSTOSCOPY/RETROGRADE/URETEROSCOPY/STONE EXTRACTION WITH BASKET Left 03/01/2013   Procedure: CYSTOSCOPY/RETROGRADE/URETEROSCOPY/STONE EXTRACTION WITH BASKET/INSERTION LEFT DOUBLE  J STENT;  Surgeon: Su Grand, MD;  Location: [facility];  Service: Urology;  Laterality: Left;  . FINGER MASS EXCISION  2008  . FOOT TENDON SURGERY  1985  . HOLMIUM LASER APPLICATION Left 03/01/2013   Procedure: HOLMIUM LASER APPLICATION;  Surgeon: Su Grand, MD;  Location: [facility];  Service: Urology;  Laterality: Left;  . INNER EAR SURGERY     x2  . PARTIAL KNEE ARTHROPLASTY Left 10/04/2017   Procedure: UNICOMPARTMENTAL LEFT  KNEE;  Surgeon: Sheral Apley, MD;  Location: [facility];  Service: Orthopedics;  Laterality: Left;  Adductor Block  . ROTATOR CUFF REPAIR     both shoulders done, last  one - 2010  . TONSILLECTOMY       Medications: Current Meds  Medication Sig  . apixaban (ELIQUIS) 5 MG TABS tablet Take 5 mg by mouth 2 (two) times daily.  Marland Kitchen atenolol (TENORMIN) 50 MG tablet Take 1.5 tablets (75 mg total) by mouth daily.  Marland Kitchen CALCIUM-VITAMIN D PO Take 1 tablet by mouth 2 (two) times daily.  . diphenhydramine-acetaminophen (TYLENOL PM) 25-500 MG TABS tablet Take 1 tablet by mouth at bedtime as needed (sleep).   . fluticasone (FLONASE) 50 MCG/ACT nasal spray Place 2 sprays into both nostrils daily.   . hydroxypropyl methylcellulose (ISOPTO TEARS) 2.5 % ophthalmic solution Place 2 drops into both eyes 3 (three) times daily as needed for dry eyes.   Marland Kitchen  levETIRAcetam (KEPPRA) 500 MG tablet Take 1 tablet (500 mg total) by mouth 2 (two) times daily.  Marland Kitchen lisinopril-hydrochlorothiazide (PRINZIDE,ZESTORETIC) 20-12.5 MG per tablet Take 1 tablet by mouth daily with breakfast.  . Multiple Vitamin (MULTIVITAMIN WITH MINERALS) TABS Take 1 tablet by mouth daily.  . Omega-3 Fatty Acids (FISH OIL) 1200 MG CPDR Take 1 capsule by mouth 2 (two) times daily.  Marland Kitchen omeprazole (PRILOSEC) 20 MG capsule Take 20 mg by mouth daily.  . simvastatin (ZOCOR) 40 MG tablet Take 40 mg by mouth at bedtime.  . [DISCONTINUED] atenolol (TENORMIN) 50 MG tablet Take 1.5 tablets (75 mg total) by mouth daily. Please make yearly appt with Dr. Katrinka Blazing for May for future refills. 1st attempt     Allergies: Allergies  Allergen Reactions  . Propofol Nausea And Vomiting and Other (See Comments)    Difficulty waking up from anesthesia. Can't breath.  . Shrimp [Shellfish Allergy] Hives    Social History: The patient  reports that she is a non-smoker but has been exposed to tobacco smoke. She has been exposed to tobacco smoke for the past 30.00 years. She has never used smokeless tobacco. She reports that she does not drink alcohol or use drugs.   Family History: The patient's family history includes Alzheimer's disease in her mother; Aneurysm in her brother.   Review of Systems: Please see the history of present illness.   All other systems are reviewed and negative.   Physical Exam: VS:  BP 134/88   Pulse 91   Ht 5\' 2"  (1.575 m)   Wt 221 lb 12.8 oz (100.6 kg)   BMI 40.57 kg/m  .  BMI Body mass index is 40.57 kg/m.  Wt Readings from Last 3 Encounters:  06/13/19 221 lb 12.8 oz (100.6 kg)  01/16/19 220 lb 3.2 oz (99.9 kg)  08/07/18 216 lb (98 kg)    General: Alert and in no acute distress. She has gained weight.   HEENT: Normal.  Neck: Supple, no JVD, carotid bruits, or masses noted.  Cardiac: Irregular irregular rhythm. Her rate is 88 by my count after resting.  Trace  pedal edema.  Respiratory:  Lungs are clear to auscultation bilaterally with normal work of breathing.  GI: Soft and nontender.  MS: No deformity or atrophy. Gait and ROM intact. She is using a cane.  Skin: Warm and dry. Color is normal.  Neuro:  Strength and sensation are intact and no gross focal deficits noted.  Psych: Alert, appropriate and with normal affect.   LABORATORY DATA:  EKG:  EKG is ordered today.  Personally reviewed by me. This demonstrates AF - HR is 91.  Lab Results  Component Value Date   WBC 5.8 09/28/2017   HGB 14.7  09/28/2017   HCT 43.5 09/28/2017   PLT 249 09/28/2017   GLUCOSE 115 (H) 10/04/2017   CHOL 180 02/28/2016   TRIG 105 02/28/2016   HDL 62 02/28/2016   LDLCALC 97 02/28/2016   ALT 18 02/27/2016   AST 18 02/27/2016   NA 146 (H) 10/04/2017   K 3.5 10/04/2017   CL 109 10/04/2017   CREATININE 0.89 10/04/2017   BUN 24 (H) 10/04/2017   CO2 26 10/04/2017   HGBA1C 5.6 02/28/2016       BNP (last 3 results) No results for input(s): BNP in the last 8760 hours.  ProBNP (last 3 results) No results for input(s): PROBNP in the last 8760 hours.   Other Studies Reviewed Today:   Monitor Study Highlights 02/2019  Finalized by Belva Crome, MD on Fri Mar 02, 2019 12:55 PM   Continue with, course atrial fibrillation throughout duration of this monitor.  Heart rate ranges between 50 and 150 bpm. Average heart rate 88 bpm.     Echo Study Conclusions 06/2017  - Left ventricle: The cavity size was normal. Wall thickness was  increased in a pattern of mild LVH. There was mild focal basal  hypertrophy of the septum. Systolic function was normal. The  estimated ejection fraction was in the range of 55% to 60%. Wall  motion was normal; there were no regional wall motion  abnormalities.  - Aortic valve: Trileaflet; mildly thickened, mildly calcified  leaflets. There was mild regurgitation.  - Mitral valve: There was mild regurgitation.    - Tricuspid valve: There was mild regurgitation.  - Pulmonary arteries: Systolic pressure was mildly increased. PA  peak pressure: 37 mm Hg (S).    ASSESSMENT & PLAN:    1. Persistent AF - managed with rate control and continued anticoagulation with Eliquis - rate is better by time of my exam - average HR was 88 by her monitor. She does not wish to be on more beta blocker. Will continue with current regimen.   2. HTN - BP looks good - no changes made today - would continue with beta blocker and Zestoretic.    3. Chronic anticoagulation - needs surveillance lab today.   4. Prior TIA - not discussed.   5. Obesity - she has gotten more sedentary with COVID - the pandemic and her own illness. Persistent fatigue which I think is related to deconditioning and weight gain. Encouragement given. She did not want to consider sleep testing.   6. COVID-19 Education: The signs and symptoms of COVID-19 were discussed with the patient and how to seek care for testing (follow up with PCP or arrange E-visit).  The importance of social distancing, staying at home, hand hygiene and wearing a mask when out in public were discussed today.  Current medicines are reviewed with the patient today.  The patient does not have concerns regarding medicines other than what has been noted above.  The following changes have been made:  See above.  Labs/ tests ordered today include:    Orders Placed This Encounter  Procedures  . Basic metabolic panel  . CBC  . EKG 12-Lead     Disposition:   FU with [place] in 6 months.     Patient is agreeable to this plan and will call if any problems develop in the interim.   SignedTruitt Merle, NP  06/13/2019 2:07 PM  [facility] [address] [place], [place]  [address] Phone: [phone]  Fax: [phone]

## 2019-06-13 ENCOUNTER — Other Ambulatory Visit: Payer: Self-pay

## 2019-06-13 ENCOUNTER — Encounter: Payer: Self-pay | Admitting: Nurse Practitioner

## 2019-06-13 ENCOUNTER — Ambulatory Visit: Payer: Medicare Other | Admitting: Nurse Practitioner

## 2019-06-13 VITALS — BP 134/88 | HR 91 | Ht 62.0 in | Wt 221.8 lb

## 2019-06-13 DIAGNOSIS — Z7901 Long term (current) use of anticoagulants: Secondary | ICD-10-CM

## 2019-06-13 DIAGNOSIS — I10 Essential (primary) hypertension: Secondary | ICD-10-CM

## 2019-06-13 DIAGNOSIS — I480 Paroxysmal atrial fibrillation: Secondary | ICD-10-CM

## 2019-06-13 DIAGNOSIS — I4819 Other persistent atrial fibrillation: Secondary | ICD-10-CM | POA: Diagnosis not present

## 2019-06-13 MED ORDER — ATENOLOL 50 MG PO TABS: 75.0000 mg | ORAL_TABLET | Freq: Every day | ORAL | 3 refills | Status: DC

## 2019-06-13 NOTE — Patient Instructions (Addendum)
After Visit Summary:  We will be checking the following labs today - BMET & CBC   Medication Instructions:    Continue with your current medicines.    If you need a refill on your cardiac medications before your next appointment, please call your pharmacy.     Testing/Procedures To Be Arranged:  N/A  Follow-Up:   See Dr. Smith in 6 months -  You will receive a reminder letter in the mail two months in advance. If you don't receive a letter, please call our office to schedule the follow-up appointment.    At [facility], you and your health needs are our priority.  As part of our continuing mission to provide you with exceptional heart care, we have created designated Provider Care Teams.  These Care Teams include your primary Cardiologist (physician) and Advanced Practice Providers (APPs -  Physician Assistants and Nurse Practitioners) who all work together to provide you with the care you need, when you need it.  Special Instructions:  . Stay safe, stay home, wash your hands for at least 20 seconds and wear a mask when out in public.  . It was good to talk with you today.    Call the [facility] office at [phone] if you have any questions, problems or concerns.       

## 2019-06-14 LAB — BASIC METABOLIC PANEL
BUN/Creatinine Ratio: 20 (ref 12–28)
BUN: 20 mg/dL (ref 8–27)
CO2: 25 mmol/L (ref 20–29)
Calcium: 9.5 mg/dL (ref 8.7–10.3)
Chloride: 106 mmol/L (ref 96–106)
Creatinine, Ser: 0.99 mg/dL (ref 0.57–1.00)
GFR calc Af Amer: 63 mL/min/{1.73_m2} (ref >59)
GFR calc non Af Amer: 55 mL/min/{1.73_m2} — ABNORMAL LOW (ref >59)
Glucose: 94 mg/dL (ref 65–99)
Potassium: 4 mmol/L (ref 3.5–5.2)
Sodium: 145 mmol/L — ABNORMAL HIGH (ref 134–144)

## 2019-06-14 LAB — CBC
Hematocrit: 44.5 % (ref 34.0–46.6)
Hemoglobin: 14.9 g/dL (ref 11.1–15.9)
MCH: 30.7 pg (ref 26.6–33.0)
MCHC: 33.5 g/dL (ref 31.5–35.7)
MCV: 92 fL (ref 79–97)
Platelets: 210 10*3/uL (ref 150–450)
RBC: 4.85 x10E6/uL (ref 3.77–5.28)
RDW: 13.4 % (ref 11.7–15.4)
WBC: 5.5 10*3/uL (ref 3.4–10.8)

## 2019-09-06 ENCOUNTER — Other Ambulatory Visit: Payer: Self-pay | Admitting: Family Medicine

## 2019-09-06 DIAGNOSIS — Z1231 Encounter for screening mammogram for malignant neoplasm of breast: Secondary | ICD-10-CM

## 2019-09-21 ENCOUNTER — Ambulatory Visit
Admission: RE | Admit: 2019-09-21 | Discharge: 2019-09-21 | Disposition: A | Discharge status: Home / Self Care | Payer: Medicare Other | Source: Ambulatory Visit | Attending: Family Medicine | Admitting: Family Medicine

## 2019-09-21 ENCOUNTER — Other Ambulatory Visit: Payer: Self-pay

## 2019-09-21 DIAGNOSIS — Z1231 Encounter for screening mammogram for malignant neoplasm of breast: Secondary | ICD-10-CM

## 2020-01-03 ENCOUNTER — Other Ambulatory Visit: Payer: Self-pay | Admitting: *Deleted

## 2020-01-03 MED ORDER — LEVETIRACETAM 500 MG PO TABS: 500.0000 mg | ORAL_TABLET | Freq: Two times a day (BID) | ORAL | 2 refills | Status: DC

## 2020-01-07 ENCOUNTER — Encounter: Payer: Self-pay | Admitting: Emergency Medicine

## 2020-01-07 ENCOUNTER — Ambulatory Visit
Admission: EM | Admit: 2020-01-07 | Discharge: 2020-01-07 | Disposition: A | Discharge status: Home / Self Care | Payer: Medicare Other | Attending: Emergency Medicine | Admitting: Emergency Medicine

## 2020-01-07 DIAGNOSIS — H66003 Acute suppurative otitis media without spontaneous rupture of ear drum, bilateral: Secondary | ICD-10-CM

## 2020-01-07 DIAGNOSIS — R059 Cough, unspecified: Secondary | ICD-10-CM | POA: Diagnosis not present

## 2020-01-07 MED ORDER — BENZONATATE 200 MG PO CAPS: 200.0000 mg | ORAL_CAPSULE | Freq: Three times a day (TID) | ORAL | 0 refills | Status: AC | PRN

## 2020-01-07 MED ORDER — AMOXICILLIN-POT CLAVULANATE 875-125 MG PO TABS: 1.0000 | ORAL_TABLET | Freq: Two times a day (BID) | ORAL | 0 refills | Status: AC

## 2020-01-07 MED ORDER — PREDNISONE 20 MG PO TABS
40.0000 mg | ORAL_TABLET | Freq: Every day | ORAL | 0 refills | Status: AC
Start: 2020-01-07 — End: 2020-01-12

## 2020-01-07 MED ORDER — ALBUTEROL SULFATE HFA 108 (90 BASE) MCG/ACT IN AERS: 1.0000 | INHALATION_SPRAY | Freq: Four times a day (QID) | RESPIRATORY_TRACT | 0 refills | Status: DC | PRN

## 2020-01-07 NOTE — ED Provider Notes (Signed)
[facility]    CSN: [id number] Arrival date & time: 01/07/20  1255      History   Chief Complaint Chief Complaint  Patient presents with   Cough   Otalgia    HPI Carol Gamble is a 79 y.o. female history of hypertension, GERD, presenting today for evaluation of cough congestion headache and bilateral ear pain/fullness.  Patient reports that symptoms began approximately 1 week.  She has had a lot of mucus production.  She has had decreased hearing out of her ears due to pain.  Reports multiple prior ear surgeries.  She also reports some associated wheezing and chest with her cough.  She began taking leftover amoxicillin 500 mg from a prior dental infection.     HPI  Past Medical History:  Diagnosis Date   Arthritis    HNP- low back    Complication of anesthesia    pt. reports that when she wakes up she had a hard time catching her breath & the PACU team told her that her BP & pulse were not doing what it was suppose to do   Dyslipidemia 02/27/2016   GERD (gastroesophageal reflux disease)    Headache(784.0)    History of asthma    childhood, age 39   History of kidney stones about 2008ish   HNP (herniated nucleus pulposus), lumbar 06/30/2012   Hypertension    states she always has palpitations    PONV (postoperative nausea and vomiting)    Seizure (HCC)    Seizures (HCC) 02/2016   Stroke-like symptoms 02/27/2016    Patient Active Problem List   Diagnosis Date Noted   Paroxysmal atrial fibrillation (HCC) 01/04/2018   Primary osteoarthritis of knee 10/04/2017   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   History of lumbar spinal fusion 09/14/2017   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/14/2017   Seizure (HCC)    TIA (transient ischemic attack) 02/27/2016   Stroke-like symptoms 02/27/2016   HTN (hypertension) 02/27/2016   Dyslipidemia 02/27/2016   HNP (herniated nucleus pulposus), lumbar 06/30/2012    Past  Surgical History:  Procedure Laterality Date   ABDOMINAL HYSTERECTOMY     1993   BACK SURGERY     2000,01,10   CYSTOSCOPY/RETROGRADE/URETEROSCOPY/STONE EXTRACTION WITH BASKET Left 03/01/2013   Procedure: CYSTOSCOPY/RETROGRADE/URETEROSCOPY/STONE EXTRACTION WITH BASKET/INSERTION LEFT DOUBLE  J STENT;  Surgeon: Su Grand, MD;  Location: [facility];  Service: Urology;  Laterality: Left;   FINGER MASS EXCISION  2008   FOOT TENDON SURGERY  1985   HOLMIUM LASER APPLICATION Left 03/01/2013   Procedure: HOLMIUM LASER APPLICATION;  Surgeon: Su Grand, MD;  Location: [facility];  Service: Urology;  Laterality: Left;   INNER EAR SURGERY     x2   PARTIAL KNEE ARTHROPLASTY Left 10/04/2017   Procedure: UNICOMPARTMENTAL LEFT  KNEE;  Surgeon: Sheral Apley, MD;  Location: [facility];  Service: Orthopedics;  Laterality: Left;  Adductor Block   ROTATOR CUFF REPAIR     both shoulders done, last one - 2010   TONSILLECTOMY      OB History   No obstetric history on file.      Home Medications    Prior to Admission medications   Medication Sig Start Date End Date Taking? Authorizing Provider  albuterol (VENTOLIN HFA) 108 (90 Base) MCG/ACT inhaler Inhale 1-2 puffs into the lungs every 6 (six) hours as needed for wheezing or shortness of breath. 01/07/20   Regine Christian C, PA-C  amoxicillin-clavulanate (AUGMENTIN) 875-125 MG tablet  Take 1 tablet by mouth every 12 (twelve) hours for 7 days. 01/07/20 01/14/20  Donelda Mailhot C, PA-C  apixaban (ELIQUIS) 5 MG TABS tablet Take 5 mg by mouth 2 (two) times daily.    [provider]  atenolol (TENORMIN) 50 MG tablet Take 1.5 tablets (75 mg total) by mouth daily. 06/13/19   Rosalio Macadamia, NP  benzonatate (TESSALON) 200 MG capsule Take 1 capsule (200 mg total) by mouth 3 (three) times daily as needed for up to 7 days for cough. 01/07/20 01/14/20  Seleste Tallman C, PA-C  CALCIUM-VITAMIN D PO Take 1 tablet by mouth 2 (two) times daily.    [provider]  diphenhydramine-acetaminophen (TYLENOL PM) 25-500 MG TABS tablet Take 1 tablet by mouth at bedtime as needed (sleep).     [provider]  fluticasone (FLONASE) 50 MCG/ACT nasal spray Place 2 sprays into both nostrils daily.     [provider]  hydroxypropyl methylcellulose (ISOPTO TEARS) 2.5 % ophthalmic solution Place 2 drops into both eyes 3 (three) times daily as needed for dry eyes.     [provider]  levETIRAcetam (KEPPRA) 500 MG tablet Take 1 tablet (500 mg total) by mouth 2 (two) times daily. 01/03/20   Ihor Austin, NP  lisinopril-hydrochlorothiazide (PRINZIDE,ZESTORETIC) 20-12.5 MG per tablet Take 1 tablet by mouth daily with breakfast.    [provider]  Multiple Vitamin (MULTIVITAMIN WITH MINERALS) TABS Take 1 tablet by mouth daily.    [provider]  Omega-3 Fatty Acids (FISH OIL) 1200 MG CPDR Take 1 capsule by mouth 2 (two) times daily.    [provider]  omeprazole (PRILOSEC) 20 MG capsule Take 20 mg by mouth daily. 10/02/18   [provider]  predniSONE (DELTASONE) 20 MG tablet Take 2 tablets (40 mg total) by mouth daily for 5 days. 01/07/20 01/12/20  Caellum Mancil C, PA-C  simvastatin (ZOCOR) 40 MG tablet Take 40 mg by mouth at bedtime.    [provider]    Family History Family History  Problem Relation Age of Onset   Alzheimer's disease Mother    Aneurysm Brother        brain, age 105    Social History Social History   Tobacco Use   Smoking status: Passive Smoke Exposure - Never Smoker   Smokeless tobacco: Never Used  Building services engineer Use: Never used  Substance Use Topics   Alcohol use: No   Drug use: No     Allergies   Propofol and Shrimp [shellfish allergy]   Review of Systems Review of Systems  Constitutional: Negative for activity change, appetite change, chills, fatigue and fever.  HENT: Positive for congestion, ear pain, hearing loss, rhinorrhea  and sinus pressure. Negative for facial swelling, sore throat and trouble swallowing.   Eyes: Negative for discharge and redness.  Respiratory: Positive for cough and wheezing. Negative for chest tightness and shortness of breath.   Cardiovascular: Negative for chest pain.  Gastrointestinal: Negative for abdominal pain, diarrhea, nausea and vomiting.  Musculoskeletal: Negative for myalgias.  Skin: Negative for rash.  Neurological: Negative for dizziness, light-headedness and headaches.     Physical Exam Triage Vital Signs ED Triage Vitals  Enc Vitals Group     BP 01/07/20 1506 126/82     Pulse Rate 01/07/20 1506 92     Resp 01/07/20 1506 18     Temp 01/07/20 1506 (!) 97.5 F (36.4 C)     Temp Source 01/07/20  1506 Oral     SpO2 01/07/20 1506 95 %     Weight --      Height --      Head Circumference --      Peak Flow --      Pain Score 01/07/20 1504 10     Pain Loc --      Pain Edu? --      Excl. in GC? --    No data found.  Updated Vital Signs BP 126/82 (BP Location: Right Arm)    Pulse 92    Temp (!) 97.5 F (36.4 C) (Oral)    Resp 18    SpO2 95%   Visual Acuity Right Eye Distance:   Left Eye Distance:   Bilateral Distance:    Right Eye Near:   Left Eye Near:    Bilateral Near:     Physical Exam Vitals and nursing note reviewed.  Constitutional:      Appearance: She is well-developed.     Comments: No acute distress  HENT:     Head: Normocephalic and atraumatic.     Ears:     Comments: Bilateral TMs irregular dull and opaque with erythema, bony landmarks not ascertainable    Nose: Nose normal.     Mouth/Throat:     Comments: Oral mucosa pink and moist, no tonsillar enlargement or exudate. Posterior pharynx patent and nonerythematous, no uvula deviation or swelling. Normal phonation. Eyes:     Conjunctiva/sclera: Conjunctivae normal.  Cardiovascular:     Rate and Rhythm: Normal rate.  Pulmonary:     Effort: Pulmonary effort is normal. No respiratory  distress.     Comments: Coarse breath sounds throughout bilateral lung fields with frequent coarse cough in room, no wheezing auscultated Abdominal:     General: There is no distension.  Musculoskeletal:        General: Normal range of motion.     Cervical back: Neck supple.  Skin:    General: Skin is warm and dry.  Neurological:     Mental Status: She is alert and oriented to person, place, and time.      UC Treatments / Results  Labs (all labs ordered are listed, but only abnormal results are displayed) Labs Reviewed - No data to display  EKG   Radiology No results found.  Procedures Procedures (including critical care time)  Medications Ordered in UC Medications - No data to display  Initial Impression / Assessment and Plan / UC Course  I have reviewed the triage vital signs and the nursing notes.  Pertinent labs & imaging results that were available during my care of the patient were reviewed by me and considered in my medical decision making (see chart for details).     Treating with Augmentin to cover otitis media/sinusitis, prednisone and albuterol to help with wheezing and shortness of breath, Tessalon for cough.  Rest and fluids.  Monitor for improvement,Discussed strict return precautions. Patient verbalized understanding and is agreeable with plan.  Final Clinical Impressions(s) / UC Diagnoses   Final diagnoses:  Non-recurrent acute suppurative otitis media of both ears without spontaneous rupture of tympanic membranes  Cough     Discharge Instructions     Augmentin twice daily for 1 week Prednisone 40 mg daily for 5 days- take with food and in the morning Albuterol inhaler as needed for shortness of breath, wheezing Tessalon for cough May use over the counter mucinex for congestion Follow up if not improving or worsening  ED Prescriptions    Medication Sig Dispense Auth. Provider   amoxicillin-clavulanate (AUGMENTIN) 875-125 MG tablet Take 1  tablet by mouth every 12 (twelve) hours for 7 days. 14 tablet Cebert Dettmann C, PA-C   predniSONE (DELTASONE) 20 MG tablet Take 2 tablets (40 mg total) by mouth daily for 5 days. 10 tablet Alyrica Thurow C, PA-C   albuterol (VENTOLIN HFA) 108 (90 Base) MCG/ACT inhaler Inhale 1-2 puffs into the lungs every 6 (six) hours as needed for wheezing or shortness of breath. 18 g Georgiana Spillane C, PA-C   benzonatate (TESSALON) 200 MG capsule Take 1 capsule (200 mg total) by mouth 3 (three) times daily as needed for up to 7 days for cough. 28 capsule Kenon Delashmit, [place] C, PA-C     PDMP not reviewed this encounter.   Lew Dawes, PA-C 01/08/20 1141

## 2020-01-07 NOTE — Discharge Instructions (Signed)
Augmentin twice daily for 1 week Prednisone 40 mg daily for 5 days- take with food and in the morning Albuterol inhaler as needed for shortness of breath, wheezing Tessalon for cough May use over the counter mucinex for congestion Follow up if not improving or worsening

## 2020-01-07 NOTE — ED Triage Notes (Signed)
Pt has been having cough, congestion, headache, and both ears hurting since last Thursday. Pt said worse at night. Pt said she has been taking amoxicillin that was given to her from her dentist and has not improved. No fevers, no SOB

## 2020-01-10 ENCOUNTER — Ambulatory Visit: Payer: Medicare Other | Admitting: Interventional Cardiology

## 2020-01-16 ENCOUNTER — Encounter: Payer: Self-pay | Admitting: Adult Health

## 2020-01-16 ENCOUNTER — Telehealth (INDEPENDENT_AMBULATORY_CARE_PROVIDER_SITE_OTHER): Payer: Medicare Other | Admitting: Adult Health

## 2020-01-16 DIAGNOSIS — R569 Unspecified convulsions: Secondary | ICD-10-CM

## 2020-01-16 NOTE — Progress Notes (Signed)
[facility]  PATIENT: Carol Gamble DOB: 1940/05/24   REASON FOR VISIT: Seizures HISTORY FROM: Patient   Virtual Visit via Video Note  I connected with Carol Gamble on 01/16/20 at  1:45 PM EST by a video enabled telemedicine application located in office and verified that I am speaking with the correct person using two identifiers who was located at their own home.   Carol Gamble, CMA schedule visit who discussed the limitations of evaluation and management by telemedicine and the availability of in person appointments. The patient expressed understanding and agreed to proceed.    HISTORY OF PRESENT ILLNESS:   HARRIETT Gamble is a 79 y.o. female who is being seen for yearly seizure follow-up.  Reports episode in September when calling her cardiologist office to schedule a follow-up visit where she remembers making a call but does not remember scheduling the visit or phone call ending with and was notified via MyChart that a follow-up visit was scheduled.  States this lasted approximately 4 minutes.  No loss of consciousness or postictal symptoms.  She does report increased stressors around that time and has not had any additional episodes.  Remains on Keppra 500 mg twice daily without side effects.  Remains independent maintaining ADLs and IADLs without difficulty.  Followed by cardiology for persistent AF on Eliquis and HTN on atenolol and lisinopril-hydrochlorothiazide.  On simvastatin 40 mg daily for HLD without myalgias.  No other concerns at this time.     REVIEW OF SYSTEMS: Full 14 system review of systems performed and notable only for those listed, all others are neg:  Constitutional: neg Cardiovascular: neg Ear/Nose/Throat: neg Skin: neg Eyes: neg Respiratory: neg Gastroitestinal: neg  Hematology/Lymphatic: neg  Endocrine: neg Musculoskeletal:neg Allergy/Immunology: neg Neurological: Seizure disorder Psychiatric: neg Sleep :  neg   ALLERGIES: Allergies  Allergen Reactions  . Propofol Nausea And Vomiting and Other (See Comments)    Difficulty waking up from anesthesia. Can't breath.  Marland Kitchen Shrimp [Shellfish Allergy] Hives    HOME MEDICATIONS: Outpatient Medications Prior to Visit  Medication Sig Dispense Refill  . albuterol (VENTOLIN HFA) 108 (90 Base) MCG/ACT inhaler Inhale 1-2 puffs into the lungs every 6 (six) hours as needed for wheezing or shortness of breath. 18 g 0  . apixaban (ELIQUIS) 5 MG TABS tablet Take 5 mg by mouth 2 (two) times daily.    Marland Kitchen atenolol (TENORMIN) 50 MG tablet Take 1.5 tablets (75 mg total) by mouth daily. 135 tablet 3  . CALCIUM-VITAMIN D PO Take 1 tablet by mouth 2 (two) times daily.    . diphenhydramine-acetaminophen (TYLENOL PM) 25-500 MG TABS tablet Take 1 tablet by mouth at bedtime as needed (sleep).     . fluticasone (FLONASE) 50 MCG/ACT nasal spray Place 2 sprays into both nostrils daily.     . hydroxypropyl methylcellulose (ISOPTO TEARS) 2.5 % ophthalmic solution Place 2 drops into both eyes 3 (three) times daily as needed for dry eyes.     Marland Kitchen levETIRAcetam (KEPPRA) 500 MG tablet Take 1 tablet (500 mg total) by mouth 2 (two) times daily. 180 tablet 2  . lisinopril-hydrochlorothiazide (PRINZIDE,ZESTORETIC) 20-12.5 MG per tablet Take 1 tablet by mouth daily with breakfast.    . Multiple Vitamin (MULTIVITAMIN WITH MINERALS) TABS Take 1 tablet by mouth daily.    . Omega-3 Fatty Acids (FISH OIL) 1200 MG CPDR Take 1 capsule by mouth 2 (two) times daily.    Marland Kitchen omeprazole (PRILOSEC) 20 MG capsule Take 20 mg by  mouth daily.    . simvastatin (ZOCOR) 40 MG tablet Take 40 mg by mouth at bedtime.     No facility-administered medications prior to visit.    PAST MEDICAL HISTORY: Past Medical History:  Diagnosis Date  . Arthritis    HNP- low back   . Complication of anesthesia    pt. reports that when she wakes up she had a hard time catching her breath & the PACU team told her that her  BP & pulse were not doing what it was suppose to do  . Dyslipidemia 02/27/2016  . GERD (gastroesophageal reflux disease)   . Headache(784.0)   . History of asthma    childhood, age 55  . History of kidney stones about 2008ish  . HNP (herniated nucleus pulposus), lumbar 06/30/2012  . Hypertension    states she always has palpitations   . PONV (postoperative nausea and vomiting)   . Seizure (HCC)   . Seizures (HCC) 02/2016  . Stroke-like symptoms 02/27/2016    PAST SURGICAL HISTORY: Past Surgical History:  Procedure Laterality Date  . ABDOMINAL HYSTERECTOMY     1993  . BACK SURGERY     2000,01,10  . CYSTOSCOPY/RETROGRADE/URETEROSCOPY/STONE EXTRACTION WITH BASKET Left 03/01/2013   Procedure: CYSTOSCOPY/RETROGRADE/URETEROSCOPY/STONE EXTRACTION WITH BASKET/INSERTION LEFT DOUBLE  J STENT;  Surgeon: Su Grand, MD;  Location: [facility];  Service: Urology;  Laterality: Left;  . FINGER MASS EXCISION  2008  . FOOT TENDON SURGERY  1985  . HOLMIUM LASER APPLICATION Left 03/01/2013   Procedure: HOLMIUM LASER APPLICATION;  Surgeon: Su Grand, MD;  Location: [facility];  Service: Urology;  Laterality: Left;  . INNER EAR SURGERY     x2  . PARTIAL KNEE ARTHROPLASTY Left 10/04/2017   Procedure: UNICOMPARTMENTAL LEFT  KNEE;  Surgeon: Sheral Apley, MD;  Location: [facility];  Service: Orthopedics;  Laterality: Left;  Adductor Block  . ROTATOR CUFF REPAIR     both shoulders done, last one - 2010  . TONSILLECTOMY      FAMILY HISTORY: Family History  Problem Relation Age of Onset  . Alzheimer's disease Mother   . Aneurysm Brother        brain, age 94    SOCIAL HISTORY: Social History   Socioeconomic History  . Marital status: Married    Spouse name: [name]  . Number of children: 3  . Years of education: 9.5  . Highest education level: Not on file  Occupational History    Comment: retired  Tobacco Use  . Smoking status: Passive Smoke Exposure - Never Smoker  . Smokeless tobacco: Never Used   Vaping Use  . Vaping Use: Never used  Substance and Sexual Activity  . Alcohol use: No  . Drug use: No  . Sexual activity: Not on file  Other Topics Concern  . Not on file  Social History Narrative   Lives with husband   Caffeine- coffee, 2-3 cups daily   Social Determinants of Health   Financial Resource Strain: Not on file  Food Insecurity: Not on file  Transportation Needs: Not on file  Physical Activity: Not on file  Stress: Not on file  Social Connections: Not on file  Intimate Partner Violence: Not on file     PHYSICAL EXAM General: well developed, well nourished, pleasant elderly Caucasian female, seated, in no evident distress Head: head normocephalic and atraumatic.    Neurologic Exam Mental Status: Awake and fully alert. Oriented to place and time. Recent and remote memory intact. Attention span,  concentration and fund of knowledge appropriate. Mood and affect appropriate.  Cranial Nerves: Extraocular movements full without nystagmus. Hearing intact to voice. Facial sensation intact. Face, tongue, palate moves normally and symmetrically.  Shoulder shrug symmetric. Motor: No evidence of weakness per drift assessment Sensory.: intact to light touch Coordination: Rapid alternating movements normal in all extremities. Finger-to-nose and heel-to-shin performed accurately bilaterally. Gait and Station: Arises from chair without difficulty. Stance is normal. Gait demonstrates normal stride length and balance .  Reflexes: UTA     DIAGNOSTIC DATA (LABS, IMAGING, TESTING) - I reviewed patient records, labs, notes, testing and imaging myself where available.  Lab Results  Component Value Date   WBC 5.5 06/13/2019   HGB 14.9 06/13/2019   HCT 44.5 06/13/2019   MCV 92 06/13/2019   PLT 210 06/13/2019      Component Value Date/Time   NA 145 (H) 06/13/2019 1426   K 4.0 06/13/2019 1426   CL 106 06/13/2019 1426   CO2 25 06/13/2019 1426   GLUCOSE 94 06/13/2019 1426    GLUCOSE 115 (H) 10/04/2017 0917   BUN 20 06/13/2019 1426   CREATININE 0.99 06/13/2019 1426   CALCIUM 9.5 06/13/2019 1426   PROT 6.9 02/27/2016 1929   ALBUMIN 4.1 02/27/2016 1929   AST 18 02/27/2016 1929   ALT 18 02/27/2016 1929   ALKPHOS 56 02/27/2016 1929   BILITOT 0.6 02/27/2016 1929   GFRNONAA 55 (L) 06/13/2019 1426   GFRAA 63 06/13/2019 1426   Lab Results  Component Value Date   CHOL 180 02/28/2016   HDL 62 02/28/2016   LDLCALC 97 02/28/2016   TRIG 105 02/28/2016   CHOLHDL 2.9 02/28/2016   Lab Results  Component Value Date   HGBA1C 5.6 02/28/2016    ASSESSMENT AND PLAN  79 y.o. year old female  has a past medical history of history of seizures 02/2016, HTN, HLD, and atrial fibrillation.     1. Seizure (HCC) episode in September consisting of transient altered mental status without recurrence Possible seizure type activity in setting of increased family stressors Advised to continue to monitor at this time and if any reoccurrence, to notify office Continue Keppra 500 mg twice daily for seizure prophylaxis Discussed seizure triggers/activities and importance of avoiding   Follow-up in 1 year or call earlier if needed  CC:  [facility] provider: Dr. Salli Quarry, Molly Maduro, MD    I spent 20 minutes of non-face-to-face time via MyChart virtual visit with patient.  This included previsit chart review, lab review, study review, order entry, electronic health record documentation, patient education and discussion regarding history of seizures, recent episode and possible etiology, ongoing use of AED and answered all other questions to patient satisfaction   Ihor Austin, [facility]  [facility] [address] [place], [place] [address]  Phone [phone] Fax [phone] Note: This document was prepared with digital dictation and possible smart phrase technology. Any transcriptional errors that result from this process are unintentional.

## 2020-01-22 NOTE — Progress Notes (Signed)
I agree with the above plan 

## 2020-02-15 DIAGNOSIS — E7800 Pure hypercholesterolemia, unspecified: Secondary | ICD-10-CM | POA: Diagnosis not present

## 2020-02-15 DIAGNOSIS — M179 Osteoarthritis of knee, unspecified: Secondary | ICD-10-CM | POA: Diagnosis not present

## 2020-02-15 DIAGNOSIS — I4891 Unspecified atrial fibrillation: Secondary | ICD-10-CM | POA: Diagnosis not present

## 2020-02-15 DIAGNOSIS — I10 Essential (primary) hypertension: Secondary | ICD-10-CM | POA: Diagnosis not present

## 2020-02-15 DIAGNOSIS — M19049 Primary osteoarthritis, unspecified hand: Secondary | ICD-10-CM | POA: Diagnosis not present

## 2020-02-15 DIAGNOSIS — K219 Gastro-esophageal reflux disease without esophagitis: Secondary | ICD-10-CM | POA: Diagnosis not present

## 2020-04-22 DIAGNOSIS — E7800 Pure hypercholesterolemia, unspecified: Secondary | ICD-10-CM | POA: Diagnosis not present

## 2020-04-22 DIAGNOSIS — K219 Gastro-esophageal reflux disease without esophagitis: Secondary | ICD-10-CM | POA: Diagnosis not present

## 2020-04-22 DIAGNOSIS — I4891 Unspecified atrial fibrillation: Secondary | ICD-10-CM | POA: Diagnosis not present

## 2020-04-22 DIAGNOSIS — I10 Essential (primary) hypertension: Secondary | ICD-10-CM | POA: Diagnosis not present

## 2020-05-04 NOTE — Progress Notes (Signed)
Cardiology Office Note:    Date:  05/05/2020   ID:  Carol, Gamble 1940-10-20, MRN [id number]  PCP:  Blair Heys, MD  Cardiologist:  Lesleigh Noe, MD   Referring MD: Blair Heys, MD   Chief Complaint  Patient presents with  . Atrial Fibrillation  . Hypertension    History of Present Illness:    Carol Gamble is a 80 y.o. female with a hx of persistent AF, HTN, morbid obesity and anticoagulation therapy.   When she is recumbent, she feels her heart racing and skipping.  This is more apparent than ever before.  She denies orthopnea.  She walks with a cane.  She is limited by left knee discomfort.  She has no dyspnea on exertion, chest pain, and does not feel palpitations when standing.  She has not had syncope.  She denies lower extremity swelling.  Past Medical History:  Diagnosis Date  . Arthritis    HNP- low back   . Complication of anesthesia    pt. reports that when she wakes up she had a hard time catching her breath & the PACU team told her that her BP & pulse were not doing what it was suppose to do  . Dyslipidemia 02/27/2016  . GERD (gastroesophageal reflux disease)   . Headache(784.0)   . History of asthma    childhood, age 74  . History of kidney stones about 2008ish  . HNP (herniated nucleus pulposus), lumbar 06/30/2012  . Hypertension    states she always has palpitations   . PONV (postoperative nausea and vomiting)   . Seizure (HCC)   . Seizures (HCC) 02/2016  . Stroke-like symptoms 02/27/2016    Past Surgical History:  Procedure Laterality Date  . ABDOMINAL HYSTERECTOMY     1993  . BACK SURGERY     2000,01,10  . CYSTOSCOPY/RETROGRADE/URETEROSCOPY/STONE EXTRACTION WITH BASKET Left 03/01/2013   Procedure: CYSTOSCOPY/RETROGRADE/URETEROSCOPY/STONE EXTRACTION WITH BASKET/INSERTION LEFT DOUBLE  J STENT;  Surgeon: Su Grand, MD;  Location: [facility];  Service: Urology;  Laterality: Left;  . FINGER MASS EXCISION  2008  . FOOT TENDON SURGERY  1985   . HOLMIUM LASER APPLICATION Left 03/01/2013   Procedure: HOLMIUM LASER APPLICATION;  Surgeon: Su Grand, MD;  Location: [facility];  Service: Urology;  Laterality: Left;  . INNER EAR SURGERY     x2  . PARTIAL KNEE ARTHROPLASTY Left 10/04/2017   Procedure: UNICOMPARTMENTAL LEFT  KNEE;  Surgeon: Sheral Apley, MD;  Location: [facility];  Service: Orthopedics;  Laterality: Left;  Adductor Block  . ROTATOR CUFF REPAIR     both shoulders done, last one - 2010  . TONSILLECTOMY      Current Medications: Current Meds  Medication Sig  . acetaminophen (TYLENOL) 500 MG tablet as needed.  Marland Kitchen albuterol (VENTOLIN HFA) 108 (90 Base) MCG/ACT inhaler Inhale 1-2 puffs into the lungs every 6 (six) hours as needed for wheezing or shortness of breath.  Marland Kitchen apixaban (ELIQUIS) 5 MG TABS tablet Take 5 mg by mouth 2 (two) times daily.  Marland Kitchen atenolol (TENORMIN) 100 MG tablet Take 1 tablet (100 mg total) by mouth daily.  Marland Kitchen CALCIUM-VITAMIN D PO Take 1 tablet by mouth 2 (two) times daily.  . fluticasone (FLONASE) 50 MCG/ACT nasal spray Place 2 sprays into both nostrils daily.  . hydroxypropyl methylcellulose (ISOPTO TEARS) 2.5 % ophthalmic solution Place 2 drops into both eyes 3 (three) times daily as needed for dry eyes.   Marland Kitchen levETIRAcetam (KEPPRA) 500  MG tablet Take 1 tablet (500 mg total) by mouth 2 (two) times daily.  Marland Kitchen. lisinopril-hydrochlorothiazide (PRINZIDE,ZESTORETIC) 20-12.5 MG per tablet Take 1 tablet by mouth daily with breakfast.  . Multiple Vitamin (MULTIVITAMIN WITH MINERALS) TABS Take 1 tablet by mouth daily.  . Omega-3 Fatty Acids (FISH OIL) 1200 MG CPDR Take 1 capsule by mouth 2 (two) times daily.  Marland Kitchen. omeprazole (PRILOSEC) 20 MG capsule Take 20 mg by mouth daily.  . simvastatin (ZOCOR) 40 MG tablet Take 40 mg by mouth at bedtime.  . [DISCONTINUED] atenolol (TENORMIN) 50 MG tablet Take 1.5 tablets (75 mg total) by mouth daily.  . [DISCONTINUED] diphenhydramine-acetaminophen (TYLENOL PM) 25-500 MG TABS tablet Take  1 tablet by mouth at bedtime as needed (sleep).      Allergies:   Propofol and Shrimp [shellfish allergy]   Social History   Socioeconomic History  . Marital status: Married    Spouse name: Reuel BoomDaniel  . Number of children: 3  . Years of education: 9.5  . Highest education level: Not on file  Occupational History    Comment: retired  Tobacco Use  . Smoking status: Passive Smoke Exposure - Never Smoker  . Smokeless tobacco: Never Used  Vaping Use  . Vaping Use: Never used  Substance and Sexual Activity  . Alcohol use: No  . Drug use: No  . Sexual activity: Not on file  Other Topics Concern  . Not on file  Social History Narrative   Lives with husband   Caffeine- coffee, 2-3 cups daily   Social Determinants of Health   Financial Resource Strain: Not on file  Food Insecurity: Not on file  Transportation Needs: Not on file  Physical Activity: Not on file  Stress: Not on file  Social Connections: Not on file     Family History: The patient's family history includes Alzheimer's disease in her mother; Aneurysm in her brother.  ROS:   Please see the history of present illness.    Left knee discomfort.  Walking with a cane.  This is new and she feels more comfortable having the cane for balance and trying to walk distances without support.  Otherwise no complaints.  No bleeding on with us.  All other systems reviewed and are negative.  EKGs/Labs/Other Studies Reviewed:    The following studies were reviewed today: Continuous monitor March 02, 2019: Study Highlights  Addendum by Lyn RecordsSmith, Clarice Zulauf W, MD on Fri Mar 02, 2019 1:17 PM  Continuous course atrial fibrillation throughout duration of this monitor.  Heart rate ranges between 50 and 150 bpm. Average heart rate 88 bpm.   Finalized by Lyn RecordsSmith, Montine Hight W, MD on Fri Mar 02, 2019 12:55 PM  Continue with, course atrial fibrillation throughout duration of this monitor.  Heart rate ranges between 50 and 150 bpm. Average heart  rate 88 bpm.  2D Doppler echocardiogram May 2019: Study Conclusions   - Left ventricle: The cavity size was normal. Wall thickness was  increased in a pattern of mild LVH. There was mild focal basal  hypertrophy of the septum. Systolic function was normal. The  estimated ejection fraction was in the range of 55% to 60%. Wall  motion was normal; there were no regional wall motion  abnormalities.  - Aortic valve: Trileaflet; mildly thickened, mildly calcified  leaflets. There was mild regurgitation.  - Mitral valve: There was mild regurgitation.  - Tricuspid valve: There was mild regurgitation.  - Pulmonary arteries: Systolic pressure was mildly increased. PA  peak pressure: 37  mm Hg (S).   EKG:  EKG performed in May 2021 demonstrates with moderate rate control and otherwise no abnormality.  Recent Labs: 06/13/2019: BUN 20; Creatinine, Ser 0.99; Hemoglobin 14.9; Platelets 210; Potassium 4.0; Sodium 145  Recent Lipid Panel    Component Value Date/Time   CHOL 180 02/28/2016 0749   TRIG 105 02/28/2016 0749   HDL 62 02/28/2016 0749   CHOLHDL 2.9 02/28/2016 0749   VLDL 21 02/28/2016 0749   LDLCALC 97 02/28/2016 0749    Physical Exam:    VS:  BP 134/84   Pulse 86   Ht 5\' 2"  (1.575 m)   Wt 225 lb (102.1 kg)   SpO2 98%   BMI 41.15 kg/m     Wt Readings from Last 3 Encounters:  05/05/20 225 lb (102.1 kg)  06/13/19 221 lb 12.8 oz (100.6 kg)  01/16/19 220 lb 3.2 oz (99.9 kg)     GEN: Obese. No acute distress HEENT: Normal NECK: No JVD. LYMPHATICS: No lymphadenopathy CARDIAC: Irregularly irregular.  No murmur.  No gallop, or edema. VASCULAR:  Normal Pulses. No bruits. RESPIRATORY:  Clear to auscultation without rales, wheezing or rhonchi  ABDOMEN: Soft, non-tender, non-distended, No pulsatile mass, MUSCULOSKELETAL: No deformity  SKIN: Warm and dry NEUROLOGIC:  Alert and oriented x 3 PSYCHIATRIC:  Normal affect   ASSESSMENT:    1. Persistent atrial  fibrillation (HCC)   2. Essential hypertension   3. TIA (transient ischemic attack)   4. Anticoagulant long-term use    PLAN:    In order of problems listed above:  1. Rate control is relatively poor and she now complains of palpitations.  2D Doppler echocardiogram to exclude change in LV function.  Increase atenolol to 100 mg/day.  Please note rate control on continuous monitor performed in 2021 when a atenolol dose was increased to 75 mg/day. 2. Diastolic pressure is 84.  Increase in atenolol will also better help control blood pressure.  2D Doppler echocardiogram to reassess LV function in the setting of A. fib with borderline control rate 3. Continue Eliquis 5 mg twice daily. 4. Continue Eliquis.  Needs hemoglobin and creatinine twice yearly.  Dr. 2022 is her primary physician.    Medication Adjustments/Labs and Tests Ordered: Current medicines are reviewed at length with the patient today.  Concerns regarding medicines are outlined above.  Orders Placed This Encounter  Procedures  . ECHOCARDIOGRAM COMPLETE   Meds ordered this encounter  Medications  . atenolol (TENORMIN) 100 MG tablet    Sig: Take 1 tablet (100 mg total) by mouth daily.    Dispense:  90 tablet    Refill:  3    Dose change    Patient Instructions  Medication Instructions:  1) INCREASE Atenolol to 100mg  once daily  *If you need a refill on your cardiac medications before your next appointment, please call your pharmacy*   Lab Work: None If you have labs (blood work) drawn today and your tests are completely normal, you will receive your results only by: Blair Heys MyChart Message (if you have MyChart) OR . A paper copy in the mail If you have any lab test that is abnormal or we need to change your treatment, we will call you to review the results.   Testing/Procedures: Your physician has requested that you have an echocardiogram. Echocardiography is a painless test that uses sound waves to create  images of your heart. It provides your doctor with information about the size and shape of your heart  and how well your heart's chambers and valves are working. This procedure takes approximately one hour. There are no restrictions for this procedure.    Follow-Up: At [facility], you and your health needs are our priority.  As part of our continuing mission to provide you with exceptional heart care, we have created designated Provider Care Teams.  These Care Teams include your primary Cardiologist (physician) and Advanced Practice Providers (APPs -  Physician Assistants and Nurse Practitioners) who all work together to provide you with the care you need, when you need it.  We recommend signing up for the patient portal called "MyChart".  Sign up information is provided on this After Visit Summary.  MyChart is used to connect with patients for Virtual Visits (Telemedicine).  Patients are able to view lab/test results, encounter notes, upcoming appointments, etc.  Non-urgent messages can be sent to your provider as well.   To learn more about what you can do with MyChart, go to [email or web address].    Your next appointment:   1 year(s)  The format for your next appointment:   In Person  Provider:   You may see Lesleigh Noe, MD or one of the following Advanced Practice Providers on your designated Care Team:    Georgie Chard, NP    Other Instructions      Signed, Lesleigh Noe, MD  05/05/2020 2:30 PM    [facility]

## 2020-05-05 ENCOUNTER — Ambulatory Visit: Payer: Medicare Other | Admitting: Interventional Cardiology

## 2020-05-05 ENCOUNTER — Other Ambulatory Visit: Payer: Self-pay

## 2020-05-05 ENCOUNTER — Encounter: Payer: Self-pay | Admitting: Interventional Cardiology

## 2020-05-05 VITALS — BP 134/84 | HR 86 | Ht 62.0 in | Wt 225.0 lb

## 2020-05-05 DIAGNOSIS — Z7901 Long term (current) use of anticoagulants: Secondary | ICD-10-CM

## 2020-05-05 DIAGNOSIS — I4819 Other persistent atrial fibrillation: Secondary | ICD-10-CM

## 2020-05-05 DIAGNOSIS — G459 Transient cerebral ischemic attack, unspecified: Secondary | ICD-10-CM | POA: Diagnosis not present

## 2020-05-05 DIAGNOSIS — U071 COVID-19: Secondary | ICD-10-CM

## 2020-05-05 DIAGNOSIS — I10 Essential (primary) hypertension: Secondary | ICD-10-CM | POA: Diagnosis not present

## 2020-05-05 MED ORDER — ATENOLOL 100 MG PO TABS
100.0000 mg | ORAL_TABLET | Freq: Every day | ORAL | 3 refills | Status: DC
Start: 2020-05-05 — End: 2021-04-24

## 2020-05-05 NOTE — Patient Instructions (Signed)
Medication Instructions:  1) INCREASE Atenolol to 100mg  once daily  *If you need a refill on your cardiac medications before your next appointment, please call your pharmacy*   Lab Work: None If you have labs (blood work) drawn today and your tests are completely normal, you will receive your results only by: MyChart Message (if you have MyChart) OR . A paper copy in the mail If you have any lab test that is abnormal or we need to change your treatment, we will call you to review the results.   Testing/Procedures: Your physician has requested that you have an echocardiogram. Echocardiography is a painless test that uses sound waves to create images of your heart. It provides your doctor with information about the size and shape of your heart and how well your heart's chambers and valves are working. This procedure takes approximately one hour. There are no restrictions for this procedure.    Follow-Up: At [facility], you and your health needs are our priority.  As part of our continuing mission to provide you with exceptional heart care, we have created designated Provider Care Teams.  These Care Teams include your primary Cardiologist (physician) and Advanced Practice Providers (APPs -  Physician Assistants and Nurse Practitioners) who all work together to provide you with the care you need, when you need it.  We recommend signing up for the patient portal called "MyChart".  Sign up information is provided on this After Visit Summary.  MyChart is used to connect with patients for Virtual Visits (Telemedicine).  Patients are able to view lab/test results, encounter notes, upcoming appointments, etc.  Non-urgent messages can be sent to your provider as well.   To learn more about what you can do with MyChart, go to [facility].    Your next appointment:   1 year(s)  The format for your next appointment:   In Person  Provider:   You may see [email or web address], MD or one  of the following Advanced Practice Providers on your designated Care Team:    Lesleigh Noe, NP    Other Instructions

## 2020-05-14 DIAGNOSIS — K219 Gastro-esophageal reflux disease without esophagitis: Secondary | ICD-10-CM | POA: Diagnosis not present

## 2020-05-14 DIAGNOSIS — M19049 Primary osteoarthritis, unspecified hand: Secondary | ICD-10-CM | POA: Diagnosis not present

## 2020-05-14 DIAGNOSIS — M179 Osteoarthritis of knee, unspecified: Secondary | ICD-10-CM | POA: Diagnosis not present

## 2020-05-14 DIAGNOSIS — I10 Essential (primary) hypertension: Secondary | ICD-10-CM | POA: Diagnosis not present

## 2020-05-14 DIAGNOSIS — E7800 Pure hypercholesterolemia, unspecified: Secondary | ICD-10-CM | POA: Diagnosis not present

## 2020-05-14 DIAGNOSIS — I4891 Unspecified atrial fibrillation: Secondary | ICD-10-CM | POA: Diagnosis not present

## 2020-06-09 ENCOUNTER — Ambulatory Visit (HOSPITAL_COMMUNITY): Payer: Medicare Other | Attending: Cardiology

## 2020-06-09 ENCOUNTER — Other Ambulatory Visit: Payer: Self-pay

## 2020-06-09 DIAGNOSIS — I4819 Other persistent atrial fibrillation: Secondary | ICD-10-CM | POA: Insufficient documentation

## 2020-06-09 LAB — ECHOCARDIOGRAM COMPLETE
Area-P 1/2: 4.75 cm2
S' Lateral: 1.9 cm

## 2020-06-16 ENCOUNTER — Telehealth: Payer: Self-pay | Admitting: Interventional Cardiology

## 2020-06-16 DIAGNOSIS — I10 Essential (primary) hypertension: Secondary | ICD-10-CM | POA: Diagnosis not present

## 2020-06-16 DIAGNOSIS — I4891 Unspecified atrial fibrillation: Secondary | ICD-10-CM | POA: Diagnosis not present

## 2020-06-16 DIAGNOSIS — E7800 Pure hypercholesterolemia, unspecified: Secondary | ICD-10-CM | POA: Diagnosis not present

## 2020-06-16 DIAGNOSIS — K219 Gastro-esophageal reflux disease without esophagitis: Secondary | ICD-10-CM | POA: Diagnosis not present

## 2020-06-16 NOTE — Telephone Encounter (Signed)
Informed pt of results. Pt verbalized understanding. 

## 2020-06-16 NOTE — Telephone Encounter (Signed)
Patient was calling to get results. Please advise

## 2020-07-05 ENCOUNTER — Encounter: Payer: Self-pay | Admitting: *Deleted

## 2020-07-05 ENCOUNTER — Ambulatory Visit
Admission: EM | Admit: 2020-07-05 | Discharge: 2020-07-05 | Disposition: A | Discharge status: Home / Self Care | Payer: Medicare Other | Attending: Urgent Care | Admitting: Urgent Care

## 2020-07-05 ENCOUNTER — Other Ambulatory Visit: Payer: Self-pay

## 2020-07-05 DIAGNOSIS — J0100 Acute maxillary sinusitis, unspecified: Secondary | ICD-10-CM | POA: Diagnosis not present

## 2020-07-05 DIAGNOSIS — R0981 Nasal congestion: Secondary | ICD-10-CM

## 2020-07-05 DIAGNOSIS — R059 Cough, unspecified: Secondary | ICD-10-CM

## 2020-07-05 DIAGNOSIS — Z7689 Persons encountering health services in other specified circumstances: Secondary | ICD-10-CM | POA: Diagnosis not present

## 2020-07-05 HISTORY — DX: Unspecified asthma, uncomplicated: J45.909

## 2020-07-05 MED ORDER — PROMETHAZINE-DM 6.25-15 MG/5ML PO SYRP: 5.0000 mL | ORAL_SOLUTION | Freq: Every evening | ORAL | 0 refills | Status: DC | PRN

## 2020-07-05 MED ORDER — AMOXICILLIN-POT CLAVULANATE 875-125 MG PO TABS: 1.0000 | ORAL_TABLET | Freq: Two times a day (BID) | ORAL | 0 refills | Status: DC

## 2020-07-05 MED ORDER — CETIRIZINE HCL 5 MG PO TABS: 5.0000 mg | ORAL_TABLET | Freq: Every day | ORAL | 0 refills | Status: DC

## 2020-07-05 MED ORDER — PREDNISONE 10 MG PO TABS: 30.0000 mg | ORAL_TABLET | Freq: Every day | ORAL | 0 refills | Status: DC

## 2020-07-05 NOTE — ED Provider Notes (Signed)
[facility]   MRN: [id number] DOB: Feb 04, 1940  Subjective:   Carol Gamble is a 80 y.o. female presenting for 5-day history of acute onset sinus congestion, weakness, coughing, chest congestion.  Patient has had some wheezing and night as well.  Her husband has been sick and is being seen here today.  Patient also has a history of asthma.  She has an albuterol inhaler and has been using this multiple times throughout the day in the past week.  She is not opposed to COVID-19 test.  No current facility-administered medications for this encounter.  Current Outpatient Medications:  .  acetaminophen (TYLENOL) 500 MG tablet, as needed., Disp: , Rfl:  .  albuterol (VENTOLIN HFA) 108 (90 Base) MCG/ACT inhaler, Inhale 1-2 puffs into the lungs every 6 (six) hours as needed for wheezing or shortness of breath., Disp: 18 g, Rfl: 0 .  apixaban (ELIQUIS) 5 MG TABS tablet, Take 5 mg by mouth 2 (two) times daily., Disp: , Rfl:  .  atenolol (TENORMIN) 100 MG tablet, Take 1 tablet (100 mg total) by mouth daily., Disp: 90 tablet, Rfl: 3 .  CALCIUM-VITAMIN D PO, Take 1 tablet by mouth 2 (two) times daily., Disp: , Rfl:  .  fluticasone (FLONASE) 50 MCG/ACT nasal spray, Place 2 sprays into both nostrils daily., Disp: , Rfl:  .  hydroxypropyl methylcellulose (ISOPTO TEARS) 2.5 % ophthalmic solution, Place 2 drops into both eyes 3 (three) times daily as needed for dry eyes. , Disp: , Rfl:  .  levETIRAcetam (KEPPRA) 500 MG tablet, Take 1 tablet (500 mg total) by mouth 2 (two) times daily., Disp: 180 tablet, Rfl: 2 .  lisinopril-hydrochlorothiazide (PRINZIDE,ZESTORETIC) 20-12.5 MG per tablet, Take 1 tablet by mouth daily with breakfast., Disp: , Rfl:  .  Multiple Vitamin (MULTIVITAMIN WITH MINERALS) TABS, Take 1 tablet by mouth daily., Disp: , Rfl:  .  Omega-3 Fatty Acids (FISH OIL) 1200 MG CPDR, Take 1 capsule by mouth 2 (two) times daily., Disp: , Rfl:  .  omeprazole (PRILOSEC) 20 MG capsule, Take  20 mg by mouth daily., Disp: , Rfl:  .  simvastatin (ZOCOR) 40 MG tablet, Take 40 mg by mouth at bedtime., Disp: , Rfl:    Allergies  Allergen Reactions  . Propofol Nausea And Vomiting and Other (See Comments)    Difficulty waking up from anesthesia. Can't breath.  Marland Kitchen Shrimp [Shellfish Allergy] Hives    Past Medical History:  Diagnosis Date  . Arthritis    HNP- low back   . Asthma   . Complication of anesthesia    pt. reports that when she wakes up she had a hard time catching her breath & the PACU team told her that her BP & pulse were not doing what it was suppose to do  . Dyslipidemia 02/27/2016  . GERD (gastroesophageal reflux disease)   . Headache(784.0)   . History of asthma    childhood, age 76  . History of kidney stones about 2008ish  . HNP (herniated nucleus pulposus), lumbar 06/30/2012  . Hypertension    states she always has palpitations   . PONV (postoperative nausea and vomiting)   . Seizure (HCC)   . Seizures (HCC) 02/2016  . Stroke-like symptoms 02/27/2016     Past Surgical History:  Procedure Laterality Date  . ABDOMINAL HYSTERECTOMY     1993  . BACK SURGERY     2000,01,10  . CYSTOSCOPY/RETROGRADE/URETEROSCOPY/STONE EXTRACTION WITH BASKET Left 03/01/2013   Procedure: CYSTOSCOPY/RETROGRADE/URETEROSCOPY/STONE EXTRACTION WITH BASKET/INSERTION  LEFT DOUBLE  J STENT;  Surgeon: Su Grand, MD;  Location: [facility];  Service: Urology;  Laterality: Left;  . FINGER MASS EXCISION  2008  . FOOT TENDON SURGERY  1985  . HOLMIUM LASER APPLICATION Left 03/01/2013   Procedure: HOLMIUM LASER APPLICATION;  Surgeon: Su Grand, MD;  Location: [facility];  Service: Urology;  Laterality: Left;  . INNER EAR SURGERY     x2  . PARTIAL KNEE ARTHROPLASTY Left 10/04/2017   Procedure: UNICOMPARTMENTAL LEFT  KNEE;  Surgeon: Sheral Apley, MD;  Location: [facility];  Service: Orthopedics;  Laterality: Left;  Adductor Block  . ROTATOR CUFF REPAIR     both shoulders done, last one - 2010  .  TONSILLECTOMY      Family History  Problem Relation Age of Onset  . Alzheimer's disease Mother   . Aneurysm Brother        brain, age 79    Social History   Tobacco Use  . Smoking status: Never Smoker  . Smokeless tobacco: Never Used  Vaping Use  . Vaping Use: Never used  Substance Use Topics  . Alcohol use: No  . Drug use: No    ROS   Objective:   Vitals: BP 105/70   Pulse 83   Temp 98.4 F (36.9 C) (Temporal)   Resp 14   SpO2 93%   Physical Exam Constitutional:      General: She is not in acute distress.    Appearance: Normal appearance. She is well-developed. She is not ill-appearing, toxic-appearing or diaphoretic.  HENT:     Head: Normocephalic and atraumatic.     Nose: Congestion and rhinorrhea present.     Mouth/Throat:     Mouth: Mucous membranes are moist.  Eyes:     Extraocular Movements: Extraocular movements intact.     Pupils: Pupils are equal, round, and reactive to light.  Cardiovascular:     Rate and Rhythm: Normal rate and regular rhythm.     Pulses: Normal pulses.     Heart sounds: Normal heart sounds. No murmur heard. No friction rub. No gallop.   Pulmonary:     Effort: Pulmonary effort is normal. No respiratory distress.     Breath sounds: No stridor. Wheezing (mild over bibasilar fields) present. No rhonchi or rales.  Skin:    General: Skin is warm and dry.     Findings: No rash.  Neurological:     Mental Status: She is alert and oriented to person, place, and time.  Psychiatric:        Mood and Affect: Mood normal.        Behavior: Behavior normal.        Thought Content: Thought content normal.      Assessment and Plan :   PDMP not reviewed this encounter.  1. Acute non-recurrent maxillary sinusitis   2. Sinus congestion   3. Cough     In light of her asthma we will use an oral prednisone course, address her current symptoms for acute sinusitis with Augmentin.  Recommend supportive care otherwise.  COVID-19 testing is  pending. Counseled patient on potential for adverse effects with medications prescribed/recommended today, ER and return-to-clinic precautions discussed, patient verbalized understanding.    Wallis Bamberg, [place] 07/05/20 1417

## 2020-07-05 NOTE — ED Triage Notes (Signed)
C/O cough, nasal congestion, weakness onset 5 days ago.  Spouse currently ill with same.  Pt c/o wheezing at night.

## 2020-07-06 LAB — SARS-COV-2, NAA 2 DAY TAT

## 2020-07-06 LAB — NOVEL CORONAVIRUS, NAA: SARS-CoV-2, NAA: NOT DETECTED

## 2020-07-15 DIAGNOSIS — R1030 Lower abdominal pain, unspecified: Secondary | ICD-10-CM | POA: Diagnosis not present

## 2020-07-15 DIAGNOSIS — K5733 Diverticulitis of large intestine without perforation or abscess with bleeding: Secondary | ICD-10-CM | POA: Diagnosis not present

## 2020-07-15 DIAGNOSIS — R110 Nausea: Secondary | ICD-10-CM | POA: Diagnosis not present

## 2020-07-21 ENCOUNTER — Other Ambulatory Visit: Payer: Self-pay | Admitting: Adult Health

## 2020-08-08 DIAGNOSIS — M179 Osteoarthritis of knee, unspecified: Secondary | ICD-10-CM | POA: Diagnosis not present

## 2020-08-08 DIAGNOSIS — I10 Essential (primary) hypertension: Secondary | ICD-10-CM | POA: Diagnosis not present

## 2020-08-08 DIAGNOSIS — I4891 Unspecified atrial fibrillation: Secondary | ICD-10-CM | POA: Diagnosis not present

## 2020-08-08 DIAGNOSIS — K219 Gastro-esophageal reflux disease without esophagitis: Secondary | ICD-10-CM | POA: Diagnosis not present

## 2020-08-08 DIAGNOSIS — M19049 Primary osteoarthritis, unspecified hand: Secondary | ICD-10-CM | POA: Diagnosis not present

## 2020-08-08 DIAGNOSIS — E7800 Pure hypercholesterolemia, unspecified: Secondary | ICD-10-CM | POA: Diagnosis not present

## 2020-09-03 ENCOUNTER — Other Ambulatory Visit: Payer: Self-pay | Admitting: Family Medicine

## 2020-09-03 DIAGNOSIS — Z1231 Encounter for screening mammogram for malignant neoplasm of breast: Secondary | ICD-10-CM

## 2020-09-10 DIAGNOSIS — I493 Ventricular premature depolarization: Secondary | ICD-10-CM | POA: Diagnosis not present

## 2020-09-10 DIAGNOSIS — I10 Essential (primary) hypertension: Secondary | ICD-10-CM | POA: Diagnosis not present

## 2020-09-10 DIAGNOSIS — I4891 Unspecified atrial fibrillation: Secondary | ICD-10-CM | POA: Diagnosis not present

## 2020-09-10 DIAGNOSIS — Z0000 Encounter for general adult medical examination without abnormal findings: Secondary | ICD-10-CM | POA: Diagnosis not present

## 2020-09-10 DIAGNOSIS — K219 Gastro-esophageal reflux disease without esophagitis: Secondary | ICD-10-CM | POA: Diagnosis not present

## 2020-09-10 DIAGNOSIS — M19049 Primary osteoarthritis, unspecified hand: Secondary | ICD-10-CM | POA: Diagnosis not present

## 2020-09-10 DIAGNOSIS — R7303 Prediabetes: Secondary | ICD-10-CM | POA: Diagnosis not present

## 2020-09-10 DIAGNOSIS — J309 Allergic rhinitis, unspecified: Secondary | ICD-10-CM | POA: Diagnosis not present

## 2020-09-10 DIAGNOSIS — E7800 Pure hypercholesterolemia, unspecified: Secondary | ICD-10-CM | POA: Diagnosis not present

## 2020-09-10 DIAGNOSIS — Z1389 Encounter for screening for other disorder: Secondary | ICD-10-CM | POA: Diagnosis not present

## 2020-09-24 ENCOUNTER — Other Ambulatory Visit: Payer: Self-pay | Admitting: Adult Health

## 2020-09-26 DIAGNOSIS — K5792 Diverticulitis of intestine, part unspecified, without perforation or abscess without bleeding: Secondary | ICD-10-CM | POA: Diagnosis not present

## 2020-10-24 DIAGNOSIS — Z23 Encounter for immunization: Secondary | ICD-10-CM | POA: Diagnosis not present

## 2020-10-27 ENCOUNTER — Ambulatory Visit
Admission: RE | Admit: 2020-10-27 | Discharge: 2020-10-27 | Disposition: A | Discharge status: Home / Self Care | Payer: Medicare Other | Source: Ambulatory Visit | Attending: Family Medicine | Admitting: Family Medicine

## 2020-10-27 ENCOUNTER — Other Ambulatory Visit: Payer: Self-pay

## 2020-10-27 DIAGNOSIS — Z1231 Encounter for screening mammogram for malignant neoplasm of breast: Secondary | ICD-10-CM

## 2020-12-29 DIAGNOSIS — H52203 Unspecified astigmatism, bilateral: Secondary | ICD-10-CM | POA: Diagnosis not present

## 2020-12-29 DIAGNOSIS — H43813 Vitreous degeneration, bilateral: Secondary | ICD-10-CM | POA: Diagnosis not present

## 2020-12-29 DIAGNOSIS — H04123 Dry eye syndrome of bilateral lacrimal glands: Secondary | ICD-10-CM | POA: Diagnosis not present

## 2020-12-29 DIAGNOSIS — Z961 Presence of intraocular lens: Secondary | ICD-10-CM | POA: Diagnosis not present

## 2021-01-19 ENCOUNTER — Encounter: Payer: Self-pay | Admitting: Adult Health

## 2021-01-19 ENCOUNTER — Ambulatory Visit: Payer: Medicare Other | Admitting: Adult Health

## 2021-01-19 VITALS — BP 122/84 | HR 64 | Ht 62.0 in | Wt 222.0 lb

## 2021-01-19 DIAGNOSIS — R569 Unspecified convulsions: Secondary | ICD-10-CM | POA: Diagnosis not present

## 2021-01-19 MED ORDER — LEVETIRACETAM 500 MG PO TABS: 500.0000 mg | ORAL_TABLET | Freq: Two times a day (BID) | ORAL | 3 refills | Status: DC

## 2021-01-19 NOTE — Patient Instructions (Addendum)
Your Plan:  Continue keppra 500 mg twice daily for seizure prevention     Follow-up in 1 year or call earlier if needed   Elderly Have a Merry Christmas and Happy New Year!!     Thank you for coming to see [place] at [facility]. I hope we have been able to provide you high quality care today.  You may receive a patient satisfaction survey over the next few weeks. We would appreciate your feedback and comments so that we may continue to improve ourselves and the health of our patients.

## 2021-01-19 NOTE — Progress Notes (Signed)
[facility]  PATIENT: Carol Gamble DOB: 28-Dec-1940   PRIMARY NEUROLOGIST: Dr. Pearlean Brownie REASON FOR VISIT: Seizures HISTORY FROM: Patient    Chief Complaint  Patient presents with   Follow-up    Rm 3 alone. Here for yearly f/u denies any seizure events. Doing well     HISTORY OF PRESENT ILLNESS:   Carol Gamble is a 80 y.o. female who is being seen for yearly seizure follow-up unaccompanied.  Overall stable.  Denies seizure activity over the past year.  Compliant on Keppra 500mg  BID. Does report occasional irritability but otherwise tolerating well.  Continues to live with her husband. Maintains ADLs and IADLs independently. Does report her 45 year old grandson was recently diagnosed with seizures.  No further concerns at this time.    Prior visit 01/16/2020 JM: Reports episode in September when calling her cardiologist office to schedule a follow-up visit where she remembers making a call but does not remember scheduling the visit or phone call ending with and was notified via MyChart that a follow-up visit was scheduled.  States this lasted approximately 4 minutes.  No loss of consciousness or postictal symptoms.  She does report increased stressors around that time and has not had any additional episodes.  Remains on Keppra 500 mg twice daily without side effects.  Remains independent maintaining ADLs and IADLs without difficulty.  Followed by cardiology for persistent AF on Eliquis and HTN on atenolol and lisinopril-hydrochlorothiazide.  On simvastatin 40 mg daily for HLD without myalgias.  No other concerns at this time.     REVIEW OF SYSTEMS: Full 14 system review of systems performed and notable only for those listed, all others are neg:  Constitutional: neg Cardiovascular: neg Ear/Nose/Throat: neg Skin: neg Eyes: neg Respiratory: neg Gastroitestinal: neg  Hematology/Lymphatic: neg  Endocrine: neg Musculoskeletal:left ankle  pain Allergy/Immunology: neg Neurological: Seizure disorder Psychiatric: neg Sleep : neg   ALLERGIES: Allergies  Allergen Reactions   Propofol Nausea And Vomiting and Other (See Comments)    Difficulty waking up from anesthesia. Can't breath.   Shrimp [Shellfish Allergy] Hives    HOME MEDICATIONS: Outpatient Medications Prior to Visit  Medication Sig Dispense Refill   acetaminophen (TYLENOL) 500 MG tablet as needed.     apixaban (ELIQUIS) 5 MG TABS tablet Take 5 mg by mouth 2 (two) times daily.     atenolol (TENORMIN) 100 MG tablet Take 1 tablet (100 mg total) by mouth daily. 90 tablet 3   CALCIUM-VITAMIN D PO Take 1 tablet by mouth 2 (two) times daily.     fluticasone (FLONASE) 50 MCG/ACT nasal spray Place 2 sprays into both nostrils daily.     hydroxypropyl methylcellulose (ISOPTO TEARS) 2.5 % ophthalmic solution Place 2 drops into both eyes 3 (three) times daily as needed for dry eyes.      levETIRAcetam (KEPPRA) 500 MG tablet TAKE 1 TABLET BY MOUTH  TWICE DAILY 180 tablet 3   lisinopril-hydrochlorothiazide (PRINZIDE,ZESTORETIC) 20-12.5 MG per tablet Take 1 tablet by mouth daily with breakfast.     Multiple Vitamin (MULTIVITAMIN WITH MINERALS) TABS Take 1 tablet by mouth daily.     Omega-3 Fatty Acids (FISH OIL) 1200 MG CPDR Take 1 capsule by mouth 2 (two) times daily.     omeprazole (PRILOSEC) 20 MG capsule Take 20 mg by mouth daily.     simvastatin (ZOCOR) 40 MG tablet Take 40 mg by mouth at bedtime.     albuterol (VENTOLIN HFA) 108 (90 Base) MCG/ACT inhaler Inhale  1-2 puffs into the lungs every 6 (six) hours as needed for wheezing or shortness of breath. 18 g 0   amoxicillin-clavulanate (AUGMENTIN) 875-125 MG tablet Take 1 tablet by mouth every 12 (twelve) hours. 14 tablet 0   cetirizine (ZYRTEC) 5 MG tablet Take 1 tablet (5 mg total) by mouth daily. 30 tablet 0   predniSONE (DELTASONE) 10 MG tablet Take 3 tablets (30 mg total) by mouth daily with breakfast. 15 tablet 0    promethazine-dextromethorphan (PROMETHAZINE-DM) 6.25-15 MG/5ML syrup Take 5 mLs by mouth at bedtime as needed for cough. 100 mL 0   No facility-administered medications prior to visit.    PAST MEDICAL HISTORY: Past Medical History:  Diagnosis Date   Arthritis    HNP- low back    Asthma    Complication of anesthesia    pt. reports that when she wakes up she had a hard time catching her breath & the PACU team told her that her BP & pulse were not doing what it was suppose to do   Dyslipidemia 02/27/2016   GERD (gastroesophageal reflux disease)    Headache(784.0)    History of asthma    childhood, age 33   History of kidney stones about 2008ish   HNP (herniated nucleus pulposus), lumbar 06/30/2012   Hypertension    states she always has palpitations    PONV (postoperative nausea and vomiting)    Seizure (HCC)    Seizures (HCC) 02/2016   Stroke-like symptoms 02/27/2016    PAST SURGICAL HISTORY: Past Surgical History:  Procedure Laterality Date   ABDOMINAL HYSTERECTOMY     1993   BACK SURGERY     2000,01,10   CYSTOSCOPY/RETROGRADE/URETEROSCOPY/STONE EXTRACTION WITH BASKET Left 03/01/2013   Procedure: CYSTOSCOPY/RETROGRADE/URETEROSCOPY/STONE EXTRACTION WITH BASKET/INSERTION LEFT DOUBLE  J STENT;  Surgeon: Su Grand, MD;  Location: [facility];  Service: Urology;  Laterality: Left;   FINGER MASS EXCISION  2008   FOOT TENDON SURGERY  1985   HOLMIUM LASER APPLICATION Left 03/01/2013   Procedure: HOLMIUM LASER APPLICATION;  Surgeon: Su Grand, MD;  Location: [facility];  Service: Urology;  Laterality: Left;   INNER EAR SURGERY     x2   PARTIAL KNEE ARTHROPLASTY Left 10/04/2017   Procedure: UNICOMPARTMENTAL LEFT  KNEE;  Surgeon: Sheral Apley, MD;  Location: [facility];  Service: Orthopedics;  Laterality: Left;  Adductor Block   ROTATOR CUFF REPAIR     both shoulders done, last one - 2010   TONSILLECTOMY      FAMILY HISTORY: Family History  Problem Relation Age of Onset   Alzheimer's disease  Mother    Aneurysm Brother        brain, age 28    SOCIAL HISTORY: Social History   Socioeconomic History   Marital status: Married    Spouse name: [name]   Number of children: 3   Years of education: 9.5   Highest education level: Not on file  Occupational History    Comment: retired  Tobacco Use   Smoking status: Never   Smokeless tobacco: Never  Vaping Use   Vaping Use: Never used  Substance and Sexual Activity   Alcohol use: No   Drug use: No   Sexual activity: Not on file  Other Topics Concern   Not on file  Social History Narrative   Lives with husband   Caffeine- coffee, 2-3 cups daily   Social Determinants of Health   Financial Resource Strain: Not on file  Food Insecurity: Not on file  Transportation Needs: Not on file  Physical Activity: Not on file  Stress: Not on file  Social Connections: Not on file  Intimate Partner Violence: Not on file     PHYSICAL EXAM Today's Vitals   01/19/21 1347  BP: 122/84  Pulse: 64  SpO2: 98%  Weight: 222 lb (100.7 kg)  Height: 5\' 2"  (1.575 m)   Body mass index is 40.6 kg/m.  General: well developed, well nourished, very pleasant elderly Caucasian female, seated, in no evident distress Head: head normocephalic and atraumatic.   Neck: supple with no carotid or supraclavicular bruits Cardiovascular: regular rate and rhythm, no murmurs Musculoskeletal: chronic left ankle deformity  Skin:  no rash/petichiae Vascular:  Normal pulses all extremities   Neurologic Exam Mental Status: Awake and fully alert. Oriented to place and time. Recent and remote memory intact. Attention span, concentration and fund of knowledge appropriate. Mood and affect appropriate.  Cranial Nerves: Pupils equal, briskly reactive to light. Extraocular movements full without nystagmus. Visual fields full to confrontation. Hearing intact. Facial sensation intact. Face, tongue, palate moves normally and symmetrically.  Motor: Normal bulk and  tone. Normal strength in all tested extremity muscles Sensory.: intact to touch , pinprick , position and vibratory sensation.  Coordination: Rapid alternating movements normal in all extremities. Finger-to-nose and heel-to-shin performed accurately bilaterally. Gait and Station: Arises from chair without difficulty. Stance is normal. Gait demonstrates normal stride length with mild favoring of left leg/ankle and use of cane Reflexes: 1+ and symmetric. Toes downgoing.        DIAGNOSTIC DATA (LABS, IMAGING, TESTING) - I reviewed patient records, labs, notes, testing and imaging myself where available.        ASSESSMENT AND PLAN  80 y.o. year old female  has a past medical history of history of seizures 02/2016, HTN, HLD, and atrial fibrillation.     1. Seizure (HCC) No additional seizures since 10/2019 consisting of transient altered mental status likely in setting of family stressors Continue Keppra 500 mg twice daily for seizure prophylaxis - discussed possibly transiting to XR formulation to see if this helps with irritability concerns - she would like to hold off at this time Recent lab work completed by PCP Discussed seizure triggers/activities and importance of avoiding   Follow-up in 1 year or call earlier if needed   CC:  11/2019, MD    I spent 22 minutes of non-face-to-face and face-to-face time with patient.  This included previsit chart review, lab review, study review, order entry, electronic health record documentation, patient education and discussion regarding history of seizures, ongoing use of AED and answered all other questions to patient satisfaction  Blair Heys, [facility]  [facility] [address] [place], [place]  Phone [phone] Fax [phone] Note: This document was prepared with digital dictation and possible smart phrase technology. Any transcriptional errors that result from this process  are unintentional.

## 2021-02-27 ENCOUNTER — Other Ambulatory Visit: Payer: Self-pay | Admitting: Interventional Cardiology

## 2021-04-24 ENCOUNTER — Other Ambulatory Visit: Payer: Self-pay | Admitting: Interventional Cardiology

## 2021-04-28 ENCOUNTER — Ambulatory Visit (INDEPENDENT_AMBULATORY_CARE_PROVIDER_SITE_OTHER): Payer: Medicare Other

## 2021-04-28 ENCOUNTER — Other Ambulatory Visit: Payer: Self-pay | Admitting: Podiatry

## 2021-04-28 ENCOUNTER — Other Ambulatory Visit: Payer: Self-pay

## 2021-04-28 ENCOUNTER — Ambulatory Visit: Payer: Medicare Other

## 2021-04-28 ENCOUNTER — Encounter: Payer: Self-pay | Admitting: Podiatry

## 2021-04-28 ENCOUNTER — Ambulatory Visit: Payer: Medicare Other | Admitting: Podiatry

## 2021-04-28 DIAGNOSIS — E7800 Pure hypercholesterolemia, unspecified: Secondary | ICD-10-CM | POA: Insufficient documentation

## 2021-04-28 DIAGNOSIS — I493 Ventricular premature depolarization: Secondary | ICD-10-CM | POA: Insufficient documentation

## 2021-04-28 DIAGNOSIS — M76822 Posterior tibial tendinitis, left leg: Secondary | ICD-10-CM

## 2021-04-28 DIAGNOSIS — N200 Calculus of kidney: Secondary | ICD-10-CM | POA: Insufficient documentation

## 2021-04-28 DIAGNOSIS — F321 Major depressive disorder, single episode, moderate: Secondary | ICD-10-CM | POA: Insufficient documentation

## 2021-04-28 DIAGNOSIS — H9190 Unspecified hearing loss, unspecified ear: Secondary | ICD-10-CM | POA: Insufficient documentation

## 2021-04-28 DIAGNOSIS — D6859 Other primary thrombophilia: Secondary | ICD-10-CM | POA: Insufficient documentation

## 2021-04-28 DIAGNOSIS — K5733 Diverticulitis of large intestine without perforation or abscess with bleeding: Secondary | ICD-10-CM | POA: Insufficient documentation

## 2021-04-28 DIAGNOSIS — K5792 Diverticulitis of intestine, part unspecified, without perforation or abscess without bleeding: Secondary | ICD-10-CM | POA: Insufficient documentation

## 2021-04-28 DIAGNOSIS — K219 Gastro-esophageal reflux disease without esophagitis: Secondary | ICD-10-CM | POA: Insufficient documentation

## 2021-04-28 DIAGNOSIS — M19049 Primary osteoarthritis, unspecified hand: Secondary | ICD-10-CM | POA: Insufficient documentation

## 2021-04-28 DIAGNOSIS — Z86010 Personal history of colonic polyps: Secondary | ICD-10-CM | POA: Insufficient documentation

## 2021-04-28 DIAGNOSIS — M7752 Other enthesopathy of left foot: Secondary | ICD-10-CM

## 2021-04-28 DIAGNOSIS — J309 Allergic rhinitis, unspecified: Secondary | ICD-10-CM | POA: Insufficient documentation

## 2021-04-28 DIAGNOSIS — K5730 Diverticulosis of large intestine without perforation or abscess without bleeding: Secondary | ICD-10-CM | POA: Insufficient documentation

## 2021-04-28 DIAGNOSIS — R7303 Prediabetes: Secondary | ICD-10-CM | POA: Insufficient documentation

## 2021-04-28 NOTE — Progress Notes (Signed)
SITUATION ?Patient Name:  Carol Gamble ?MRN:   [id number] ?Reason for Visit: Evaluation for [facility] AFO ? ?Patient Report: ?Chief Complaint:   Ankle pain ?Nature of Discomfort/Pain:  Ambulatory Standing ?Location:    left lower extremity ?Onset & Duration:   Gradual and Present longer than 3 months ?Course:    gradually worsening ?Aggravating or Alleviating Factors: Ambulation ? ?OBJECTIVE DATA & MEASUREMENTS ?Prognosis:    Good ?Duration of use:   5 years ? ?Diagnosis: ?  ICD-10-CM   ?1. Posterior tibial tendon dysfunction (PTTD) of left lower extremity  [id number]   ?  ? ? ?GOALS, NECESSITIES, & JUSTIFICATIONS ?Recommended Device: [employer] ?Color:    Black ?Closure:   Laces and Velcro ? ?Laterality HCPCS Code Description Justification  ?left [id number] Plastic orthosis, custom molded from a model of the patient, custom fabricated, includes casting and cast preparation. Necessary to provide triplanar support to the foot/ankle complex  ?left L2330 Addition to lower extremity, lacer molded to patient model Necessary to ensure secure hold of orthosis to patient's limb  ?left L2820 Addition to lower extremity orthosis, soft interface for molded plastic below knee section Necessary to relieve pressure on bony prominences  ? ? ?I certify that Carol Gamble qualifies for and will benefit from an ankle foot orthosis used during ambulation based on meeting all of the following criteria;  ? ?The patient is: ?- Ambulatory, and ?- Has weakness or deformity of the foot and ankle, and ?- Requires stabilization for medical reasons, and ?- Has the potential to benefit functionally ? ?The patient?s medical record contains sufficient documentation of the patients medical condition to substantiate the necessity for the type and quantity of the items ordered. ? ?The goals of this therapy: ?- Improve Mobility ?- Improve Lower Extremity Stability ?- Decrease Pain ?- Facilitate Soft Tissue Healing ?- Facilitate  Immobilization, healing and treatment of an injury ? ?Necessity of Ankle Foot Orthotic molded to patient model: ?A custom (vs. prefabricated) ankle foot orthosis has been prescribed based on the following criteria which are specific to the condition of this patient; ?- The patient could not be fit with a prefabricated AFO ?- The condition necessitating the orthosis is expected to be permanent or of longstanding duration (more than 6 months) ?- There is need to control the ankle or foot in more than one plane ?- The patient has a documented neurological, circulatory, or orthopedic condition that requires custom fabrication over a model to prevent tissue injury ?- The patient has a healing fracture that lacks normal anatomical integrity or anthropometric proportions ? ?I hereby certify that the ankle foot orthotic described above is a rigid or semi-rigid device which is used for the purpose of supporting a weak or deformed body member or restricting or eliminating motion in a diseased or injured part of the body. It is designed to provide support and counterforce on the limb or body part that is being braced. In my opinion, the custom molded ankle foot orthosis is both reasonable and necessary in reference to accepted standards of medical practice in the treatment  ?of the patient condition and rehabilitation. ? ?ACTIONS PERFORMED ?Patient was evaluated and casted for [place] AFO via [place]. Procedure was explained to patient. Patient tolerated procedure. patient selected device color and closure method.  ? ?PLAN ?Patient to return in four to six weeks for fitting and delivery of device. Plan of care was explained to and agreed upon by patient. All questions were answered and  concerns addressed. ? ? ? ? ? ?

## 2021-04-29 NOTE — Progress Notes (Signed)
?Subjective:  ?Patient ID: Carol Gamble, female    DOB: [birth year],  MRN: [id number] ?HPI ?Chief Complaint  ?Patient presents with  ? Foot Pain  ?  Medial foot and ankle left - tendon surgery in 1985, kept foot from rolling for years, now over the last 2 years feels like she is walking on her ankle, swelling, interested in a brace, would like to avoid surgery, currently wearing an arch sleeve  ? New Patient (Initial Visit)  ? ? ?81 y.o. female presents with the above complaint.  ? ?ROS: Denies fever chills nausea vomiting muscle aches pains calf pain back pain chest pain shortness of breath. ? ?Past Medical History:  ?Diagnosis Date  ? Arthritis   ? HNP- low back   ? Asthma   ? Complication of anesthesia   ? pt. reports that when she wakes up she had a hard time catching her breath & the PACU team told her that her BP & pulse were not doing what it was suppose to do  ? Dyslipidemia 02/27/2016  ? GERD (gastroesophageal reflux disease)   ? Headache(784.0)   ? History of asthma   ? childhood, age 43  ? History of kidney stones about 2008ish  ? HNP (herniated nucleus pulposus), lumbar 06/30/2012  ? Hypertension   ? states she always has palpitations   ? PONV (postoperative nausea and vomiting)   ? Seizure ([place])   ? Seizures ([place]) 02/2016  ? Stroke-like symptoms 02/27/2016  ? ?Past Surgical History:  ?Procedure Laterality Date  ? ABDOMINAL HYSTERECTOMY    ? 1993  ? BACK SURGERY    ? 2000,01,10  ? CYSTOSCOPY/RETROGRADE/URETEROSCOPY/STONE EXTRACTION WITH BASKET Left 03/01/2013  ? Procedure: CYSTOSCOPY/RETROGRADE/URETEROSCOPY/STONE EXTRACTION WITH BASKET/INSERTION LEFT DOUBLE  J STENT;  Surgeon: Lowella Bandy, MD;  Location: [facility];  Service: Urology;  Laterality: Left;  ? FINGER MASS EXCISION  2008  ? [place]  ? HOLMIUM LASER APPLICATION Left [id number]  ? Procedure: HOLMIUM LASER APPLICATION;  Surgeon: Lowella Bandy, MD;  Location: [facility];  Service: Urology;  Laterality: Left;  ? INNER EAR SURGERY    ? x2  ?  PARTIAL KNEE ARTHROPLASTY Left 10/04/2017  ? Procedure: UNICOMPARTMENTAL LEFT  KNEE;  Surgeon: Renette Butters, MD;  Location: [facility];  Service: Orthopedics;  Laterality: Left;  Adductor Block  ? ROTATOR CUFF REPAIR    ? both shoulders done, last one - 2010  ? TONSILLECTOMY    ? ? ?Current Outpatient Medications:  ?  acetaminophen (TYLENOL) 500 MG tablet, as needed., Disp: , Rfl:  ?  apixaban (ELIQUIS) 5 MG TABS tablet, Take 5 mg by mouth 2 (two) times daily., Disp: , Rfl:  ?  atenolol (TENORMIN) 100 MG tablet, TAKE 1 TABLET BY MOUTH  DAILY, Disp: 90 tablet, Rfl: 0 ?  CALCIUM-VITAMIN D PO, Take 1 tablet by mouth 2 (two) times daily., Disp: , Rfl:  ?  fluticasone (FLONASE) 50 MCG/ACT nasal spray, Place 2 sprays into both nostrils daily., Disp: , Rfl:  ?  hydroxypropyl methylcellulose (ISOPTO TEARS) 2.5 % ophthalmic solution, Place 2 drops into both eyes 3 (three) times daily as needed for dry eyes. , Disp: , Rfl:  ?  levETIRAcetam (KEPPRA) 500 MG tablet, Take 1 tablet (500 mg total) by mouth 2 (two) times daily., Disp: 180 tablet, Rfl: 3 ?  lisinopril-hydrochlorothiazide (PRINZIDE,ZESTORETIC) 20-12.5 MG per tablet, Take 1 tablet by mouth daily with breakfast., Disp: , Rfl:  ?  Multiple Vitamin (MULTIVITAMIN WITH MINERALS)  TABS, Take 1 tablet by mouth daily., Disp: , Rfl:  ?  Omega-3 Fatty Acids (FISH OIL) 1200 MG CPDR, Take 1 capsule by mouth 2 (two) times daily., Disp: , Rfl:  ?  omeprazole (PRILOSEC) 20 MG capsule, Take 20 mg by mouth daily., Disp: , Rfl:  ?  simvastatin (ZOCOR) 40 MG tablet, Take 40 mg by mouth at bedtime., Disp: , Rfl:  ? ?Allergies  ?Allergen Reactions  ? Propofol Nausea And Vomiting and Other (See Comments)  ?  Difficulty waking up from anesthesia. Can't breath.  ? Shrimp [Shellfish Allergy] Hives  ? ?Review of Systems ?Objective:  ?There were no vitals filed for this visit. ? ?General: Well developed, nourished, in no acute distress, alert and oriented x3  ? ?Dermatological: Skin is warm, dry  and supple bilateral. Nails x 10 are well maintained; remaining integument appears unremarkable at this time. There are no open sores, no preulcerative lesions, no rash or signs of infection present. ? ?Vascular: Dorsalis Pedis artery and Posterior Tibial artery pedal pulses are 2/4 bilateral with immedate capillary fill time. Pedal hair growth present. No varicosities and no lower extremity edema present bilateral.  ? ?Neruologic: Grossly intact via light touch bilateral. Vibratory intact via tuning fork bilateral. Protective threshold with Semmes Wienstein monofilament intact to all pedal sites bilateral. Patellar and Achilles deep tendon reflexes 2+ bilateral. No Babinski or clonus noted bilateral.  ? ?Musculoskeletal: No gross boney pedal deformities bilateral. No pain, crepitus, or limitation noted with foot and ankle range of motion bilateral. Muscular strength 5/5 in all groups tested bilateral.  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 ? ?Gait: Unassisted, Nonantalgic.  ? ? ?Radiographs: ? ?Radiographs taken demonstrate severe pes planovalgus with near complete subluxation of the talonavicular joint and severe osteoarthritic changes. ? ?Assessment & Plan:  ? ?Assessment: Posterior tibial tendon dysfunction collapse of the medial longitudinal arch pes planovalgus. ? ?Plan: I discussed bracing with her explained to her that surgery is not a good option at her age.  We will try bracing with an [place] first she was sent to [facility] today for casting. ? ? ? ? ?Hadley Soileau T. [place], DPM ?

## 2021-06-12 DIAGNOSIS — E7800 Pure hypercholesterolemia, unspecified: Secondary | ICD-10-CM | POA: Diagnosis not present

## 2021-06-12 DIAGNOSIS — K219 Gastro-esophageal reflux disease without esophagitis: Secondary | ICD-10-CM | POA: Diagnosis not present

## 2021-06-12 DIAGNOSIS — I4891 Unspecified atrial fibrillation: Secondary | ICD-10-CM | POA: Diagnosis not present

## 2021-06-12 DIAGNOSIS — I10 Essential (primary) hypertension: Secondary | ICD-10-CM | POA: Diagnosis not present

## 2021-06-23 ENCOUNTER — Ambulatory Visit (INDEPENDENT_AMBULATORY_CARE_PROVIDER_SITE_OTHER): Payer: Medicare Other

## 2021-06-23 DIAGNOSIS — M76822 Posterior tibial tendinitis, left leg: Secondary | ICD-10-CM

## 2021-06-23 NOTE — Progress Notes (Signed)
SITUATION Reason for Visit: Dispensation and Fitting of Custom Molded Gauntlet Patient Report: Patient reports comfort in ambulation and understands all instructions.  OBJECTIVE DATA Patient History / Diagnosis:     ICD-10-CM   1. Posterior tibial tendon dysfunction (PTTD) of left lower extremity  M76.822       Provided Device:  Custom Molded Gauntlet: Style [place] Standard  Goals of Orthosis: - Improve gait - Decrease energy expenditure during the gait cycle - Improve balance - Stabilize motion at ankle and subtalar joint - Compensate for muscle weakness - Facilitate motion - Provide triplanar ankle and foot stabilization for weight bearing activities  Device Justification: - Patient is ambulatory  - Device is medically necessary as part of the overall treatment due to the patient's condition and related symptoms - It is anticipated that the patient will benefit functionally with use of the device.  - The custom device is utilized in an attempt to avoid the need for surgery and because a prefabricated device is inappropriate.  Upon gait analysis, the device appeared to be fitting well and the patient states that the device is comfortable.  ACTIONS PERFORMED Patient was fit with IT consultant. Patient tolerated fitting procedure. Fit of the device is good. Patient was able to apply properly and ambulate without distress. Device function is to restrict and limit motion and provide stabilization in the ankle joint.   Goals and function of this device were explained in detail to the patient. The patient was shown how to properly apply, wear, and care for the device. It was explained that the device will fit and function best in an adjustable-closure shoe with a firm heel counter and a wide base of support. When the device was dispensed, it was suitable for the patient's condition and not substandard. No guarantees were given. Precautions were reviewed.    Written instructions, warranty information, and a copy of DMEPOS Supplier Standards were provided. All questions answered and concerns addressed.  PLAN Patient is to follow up in one week or as necessary (PRN). Plan of care was discussed with and agreed upon by patient.

## 2021-06-25 ENCOUNTER — Other Ambulatory Visit: Payer: Self-pay | Admitting: Interventional Cardiology

## 2021-07-28 NOTE — Progress Notes (Signed)
Cardiology Office Note:    Date:  07/30/2021   ID:  Calyse, [name] 07-01-1940, MRN [id number]  PCP:  Blair Heys, MD  Cardiologist:  Lesleigh Noe, MD   Referring MD: Blair Heys, MD   Chief Complaint  Patient presents with   Atrial Fibrillation    History of Present Illness:    Carol Gamble is a 81 y.o. female with a hx of persistent AF, HTN, morbid obesity and anticoagulation therapy.   She is doing well.  Since we increase the dose of atenolol last year, she has not had as much palpitation.  She denies orthopnea and PND.  No lower extremity swelling or chest pain.  She has not had syncope.  No obvious side effects to medication.  Past Medical History:  Diagnosis Date   Arthritis    HNP- low back    Asthma    Complication of anesthesia    pt. reports that when she wakes up she had a hard time catching her breath & the PACU team told her that her BP & pulse were not doing what it was suppose to do   Dyslipidemia 02/27/2016   GERD (gastroesophageal reflux disease)    Headache(784.0)    History of asthma    childhood, age 84   History of kidney stones about 2008ish   HNP (herniated nucleus pulposus), lumbar 06/30/2012   Hypertension    states she always has palpitations    PONV (postoperative nausea and vomiting)    Seizure (HCC)    Seizures (HCC) 02/2016   Stroke-like symptoms 02/27/2016    Past Surgical History:  Procedure Laterality Date   ABDOMINAL HYSTERECTOMY     1993   BACK SURGERY     2000,01,10   CYSTOSCOPY/RETROGRADE/URETEROSCOPY/STONE EXTRACTION WITH BASKET Left 03/01/2013   Procedure: CYSTOSCOPY/RETROGRADE/URETEROSCOPY/STONE EXTRACTION WITH BASKET/INSERTION LEFT DOUBLE  J STENT;  Surgeon: Su Grand, MD;  [facility];  Service: Urology;  Laterality: Left;   FINGER MASS EXCISION  2008   FOOT TENDON SURGERY  1985   HOLMIUM LASER APPLICATION Left 03/01/2013   Procedure: HOLMIUM LASER APPLICATION;  Surgeon: Su Grand, MD;  [facility]: [facility];  Service: Urology;  Laterality: Left;   INNER EAR SURGERY     x2   PARTIAL KNEE ARTHROPLASTY Left 10/04/2017   Procedure: UNICOMPARTMENTAL LEFT  KNEE;  Surgeon: Sheral Apley, MD;  [facility]: [facility];  Service: Orthopedics;  Laterality: Left;  Adductor Block   ROTATOR CUFF REPAIR     both shoulders done, last one - 2010   TONSILLECTOMY      Current Medications: Current Meds  Medication Sig   acetaminophen (TYLENOL) 500 MG tablet as needed.   apixaban (ELIQUIS) 5 MG TABS tablet Take 5 mg by mouth 2 (two) times daily.   Ascorbic Acid (VITA-C PO) Take 1 tablet by mouth daily.   atenolol (TENORMIN) 100 MG tablet TAKE 1 TABLET BY MOUTH DAILY   CALCIUM-VITAMIN D PO Take 1 tablet by mouth 2 (two) times daily.   fluticasone (FLONASE) 50 MCG/ACT nasal spray Place 2 sprays into both nostrils daily.   hydroxypropyl methylcellulose (ISOPTO TEARS) 2.5 % ophthalmic solution Place 2 drops into both eyes 3 (three) times daily as needed for dry eyes.    levETIRAcetam (KEPPRA) 500 MG tablet Take 1 tablet (500 mg total) by mouth 2 (two) times daily.   lisinopril-hydrochlorothiazide (PRINZIDE,ZESTORETIC) 20-12.5 MG per tablet Take 1 tablet by mouth daily with breakfast.   Multiple Vitamin (MULTIVITAMIN  WITH MINERALS) TABS Take 1 tablet by mouth daily.   Omega-3 Fatty Acids (FISH OIL) 1200 MG CPDR Take 1 capsule by mouth 2 (two) times daily.   omeprazole (PRILOSEC) 20 MG capsule Take 20 mg by mouth daily.   simvastatin (ZOCOR) 40 MG tablet Take 40 mg by mouth at bedtime.     Allergies:   Propofol and Shrimp [shellfish allergy]   Social History   Socioeconomic History   Marital status: Married    Spouse name: Carol Gamble   Number of children: 3   Years of education: 9.5   Highest education level: Not on file  Occupational History    Comment: retired  Tobacco Use   Smoking status: Never   Smokeless tobacco: Never  Vaping Use   Vaping Use: Never used  Substance and Sexual Activity   Alcohol  use: No   Drug use: No   Sexual activity: Not on file  Other Topics Concern   Not on file  Social History Narrative   Lives with husband   Caffeine- coffee, 2-3 cups daily   Social Determinants of Health   Financial Resource Strain: Not on file  Food Insecurity: Not on file  Transportation Needs: Not on file  Physical Activity: Not on file  Stress: Not on file  Social Connections: Not on file     Family History: The patient's family history includes Alzheimer's disease in her mother; Aneurysm in her brother.  ROS:   Please see the history of present illness.    She and her husband in the donut hole and asked for Eliquis.  All other systems reviewed and are negative.  EKGs/Labs/Other Studies Reviewed:    The following studies were reviewed today:  ECHO 2022: IMPRESSIONS   1. Left ventricular ejection fraction, by estimation, is 60 to 65%. The  left ventricle has normal function. The left ventricle has no regional  wall motion abnormalities. Left ventricular diastolic parameters are  indeterminate.   2. Right ventricular systolic function is normal. The right ventricular  size is moderately enlarged. There is normal pulmonary artery systolic  pressure.   3. Left atrial size was severely dilated.   4. Right atrial size was severely dilated.   5. The mitral valve is normal in structure. No evidence of mitral valve  regurgitation. No evidence of mitral stenosis.   6. The aortic valve is calcified. There is mild calcification of the  aortic valve. There is mild thickening of the aortic valve. Aortic valve  regurgitation is not visualized. Mild aortic valve sclerosis is present,  with no evidence of aortic valve  stenosis.   7. The inferior vena cava is normal in size with greater than 50%  respiratory variability, suggesting right atrial pressure of 3 mmHg.   MONITOR 2021: Addendum by Lyn Records, MD on Fri Mar 02, 2019  1:17 PM Continuous course atrial fibrillation  throughout duration of this monitor. Heart rate ranges between 50 and 150 bpm. Average heart rate 88 bpm     EKG:  EKG EKG atrial fibrillation with RSR prime in V1, controlled rate at 81 bpm, left axis deviation.  When compared to prior, no changes noted  Recent Labs: No results found for requested labs within last 365 days.  Recent Lipid Panel    Component Value Date/Time   CHOL 180 02/28/2016 0749   TRIG 105 02/28/2016 0749   HDL 62 02/28/2016 0749   CHOLHDL 2.9 02/28/2016 0749   VLDL 21 02/28/2016 0749   LDLCALC  97 02/28/2016 0749    Physical Exam:    VS:  BP 120/80   Pulse 81   Ht 5\' 2"  (1.575 m)   Wt 221 lb 6.4 oz (100.4 kg)   SpO2 96%   BMI 40.49 kg/m     Wt Readings from Last 3 Encounters:  07/30/21 221 lb 6.4 oz (100.4 kg)  01/19/21 222 lb (100.7 kg)  05/05/20 225 lb (102.1 kg)     GEN: Morbidly obese. No acute distress HEENT: Normal NECK: No JVD. LYMPHATICS: No lymphadenopathy CARDIAC: No murmur. IIRR no gallop, or edema. VASCULAR:  Normal Pulses. No bruits. RESPIRATORY:  Clear to auscultation without rales, wheezing or rhonchi  ABDOMEN: Soft, non-tender, non-distended, No pulsatile mass, MUSCULOSKELETAL: No deformity  SKIN: Warm and dry NEUROLOGIC:  Alert and oriented x 3 PSYCHIATRIC:  Normal affect   ASSESSMENT:    1. Persistent atrial fibrillation (HCC)   2. TIA (transient ischemic attack)   3. Essential hypertension   4. Chronic anticoagulation    PLAN:    In order of problems listed above:  Controlled ventricular rate on Tenormin. No recurrent neurological symptoms. Excellent blood pressure control on current therapy. Continue Eliquis.  Needs blood work (creatinine and hemoglobin, states she will have blood work done by Dr. 07/05/20 next month).  Clinical follow-up in 1 year   Medication Adjustments/Labs and Tests Ordered: Current medicines are reviewed at length with the patient today.  Concerns regarding medicines are outlined  above.  No orders of the defined types were placed in this encounter.  No orders of the defined types were placed in this encounter.   There are no Patient Instructions on file for this visit.   Signed, Manus Gunning, MD  07/30/2021 4:09 PM    [facility]

## 2021-07-30 ENCOUNTER — Ambulatory Visit: Payer: Medicare Other | Admitting: Interventional Cardiology

## 2021-07-30 ENCOUNTER — Encounter: Payer: Self-pay | Admitting: Interventional Cardiology

## 2021-07-30 VITALS — BP 120/80 | HR 81 | Ht 62.0 in | Wt 221.4 lb

## 2021-07-30 DIAGNOSIS — I4819 Other persistent atrial fibrillation: Secondary | ICD-10-CM | POA: Diagnosis not present

## 2021-07-30 DIAGNOSIS — G459 Transient cerebral ischemic attack, unspecified: Secondary | ICD-10-CM | POA: Diagnosis not present

## 2021-07-30 DIAGNOSIS — Z7901 Long term (current) use of anticoagulants: Secondary | ICD-10-CM | POA: Diagnosis not present

## 2021-07-30 DIAGNOSIS — I10 Essential (primary) hypertension: Secondary | ICD-10-CM

## 2021-07-30 NOTE — Patient Instructions (Signed)

## 2021-08-14 DIAGNOSIS — R110 Nausea: Secondary | ICD-10-CM | POA: Diagnosis not present

## 2021-08-14 DIAGNOSIS — R195 Other fecal abnormalities: Secondary | ICD-10-CM | POA: Diagnosis not present

## 2021-09-03 ENCOUNTER — Other Ambulatory Visit: Payer: Self-pay | Admitting: Interventional Cardiology

## 2021-09-11 DIAGNOSIS — R42 Dizziness and giddiness: Secondary | ICD-10-CM | POA: Diagnosis not present

## 2021-09-11 DIAGNOSIS — M19049 Primary osteoarthritis, unspecified hand: Secondary | ICD-10-CM | POA: Diagnosis not present

## 2021-09-11 DIAGNOSIS — I4891 Unspecified atrial fibrillation: Secondary | ICD-10-CM | POA: Diagnosis not present

## 2021-09-11 DIAGNOSIS — D6869 Other thrombophilia: Secondary | ICD-10-CM | POA: Diagnosis not present

## 2021-09-11 DIAGNOSIS — I493 Ventricular premature depolarization: Secondary | ICD-10-CM | POA: Diagnosis not present

## 2021-09-11 DIAGNOSIS — I10 Essential (primary) hypertension: Secondary | ICD-10-CM | POA: Diagnosis not present

## 2021-09-11 DIAGNOSIS — E7800 Pure hypercholesterolemia, unspecified: Secondary | ICD-10-CM | POA: Diagnosis not present

## 2021-09-11 DIAGNOSIS — Z0000 Encounter for general adult medical examination without abnormal findings: Secondary | ICD-10-CM | POA: Diagnosis not present

## 2021-09-11 DIAGNOSIS — K219 Gastro-esophageal reflux disease without esophagitis: Secondary | ICD-10-CM | POA: Diagnosis not present

## 2021-09-11 DIAGNOSIS — R7303 Prediabetes: Secondary | ICD-10-CM | POA: Diagnosis not present

## 2021-09-16 ENCOUNTER — Other Ambulatory Visit: Payer: Self-pay | Admitting: Family Medicine

## 2021-09-16 DIAGNOSIS — Z1231 Encounter for screening mammogram for malignant neoplasm of breast: Secondary | ICD-10-CM

## 2021-10-28 ENCOUNTER — Ambulatory Visit
Admission: RE | Admit: 2021-10-28 | Discharge: 2021-10-28 | Disposition: A | Discharge status: Home / Self Care | Payer: Medicare Other | Source: Ambulatory Visit | Attending: Family Medicine | Admitting: Family Medicine

## 2021-10-28 DIAGNOSIS — Z1231 Encounter for screening mammogram for malignant neoplasm of breast: Secondary | ICD-10-CM | POA: Diagnosis not present

## 2021-11-08 ENCOUNTER — Other Ambulatory Visit: Payer: Self-pay | Admitting: Adult Health

## 2021-12-04 DIAGNOSIS — Z23 Encounter for immunization: Secondary | ICD-10-CM | POA: Diagnosis not present

## 2021-12-30 DIAGNOSIS — H524 Presbyopia: Secondary | ICD-10-CM | POA: Diagnosis not present

## 2021-12-30 DIAGNOSIS — Z961 Presence of intraocular lens: Secondary | ICD-10-CM | POA: Diagnosis not present

## 2021-12-30 DIAGNOSIS — H43813 Vitreous degeneration, bilateral: Secondary | ICD-10-CM | POA: Diagnosis not present

## 2021-12-30 DIAGNOSIS — R7303 Prediabetes: Secondary | ICD-10-CM | POA: Diagnosis not present

## 2022-01-18 NOTE — Progress Notes (Unsigned)
[facility]  PATIENT: Carol Gamble DOB: [birth year]   PRIMARY NEUROLOGIST: Dr. Pearlean Brownie REASON FOR VISIT: Seizures HISTORY FROM: Patient    No chief complaint on file.    HISTORY OF PRESENT ILLNESS:   Carol Gamble is a 80 y.o. female who is being seen for yearly seizure follow-up unaccompanied.  She was previously seen 01/19/2021 and doing well at that time.  Overall stable since prior visit.  Denies seizure activity over the past year.  Compliant on Keppra 500mg  BID. Does report occasional irritability but otherwise tolerating well.  Continues to live with her husband. Maintains ADLs and IADLs independently. Does report her 79 year old grandson was recently diagnosed with seizures.  No further concerns at this time.      REVIEW OF SYSTEMS: Full 14 system review of systems performed and notable only for those listed, all others are neg:  Constitutional: neg Cardiovascular: neg Ear/Nose/Throat: neg Skin: neg Eyes: neg Respiratory: neg Gastroitestinal: neg  Hematology/Lymphatic: neg  Endocrine: neg Musculoskeletal: neg Allergy/Immunology: neg Neurological: Seizure disorder Psychiatric: neg Sleep : neg   ALLERGIES: Allergies  Allergen Reactions   Propofol Nausea And Vomiting and Other (See Comments)    Difficulty waking up from anesthesia. Can't breath.   Shrimp [Shellfish Allergy] Hives    HOME MEDICATIONS: Outpatient Medications Prior to Visit  Medication Sig Dispense Refill   acetaminophen (TYLENOL) 500 MG tablet as needed.     apixaban (ELIQUIS) 5 MG TABS tablet Take 5 mg by mouth 2 (two) times daily.     Ascorbic Acid (VITA-C PO) Take 1 tablet by mouth daily.     atenolol (TENORMIN) 100 MG tablet TAKE 1 TABLET BY MOUTH DAILY 90 tablet 3   CALCIUM-VITAMIN D PO Take 1 tablet by mouth 2 (two) times daily.     fluticasone (FLONASE) 50 MCG/ACT nasal spray Place 2 sprays into both nostrils daily.     hydroxypropyl methylcellulose (ISOPTO  TEARS) 2.5 % ophthalmic solution Place 2 drops into both eyes 3 (three) times daily as needed for dry eyes.      levETIRAcetam (KEPPRA) 500 MG tablet TAKE 1 TABLET BY MOUTH TWICE  DAILY 200 tablet 2   lisinopril-hydrochlorothiazide (PRINZIDE,ZESTORETIC) 20-12.5 MG per tablet Take 1 tablet by mouth daily with breakfast.     Multiple Vitamin (MULTIVITAMIN WITH MINERALS) TABS Take 1 tablet by mouth daily.     Omega-3 Fatty Acids (FISH OIL) 1200 MG CPDR Take 1 capsule by mouth 2 (two) times daily.     omeprazole (PRILOSEC) 20 MG capsule Take 20 mg by mouth daily.     simvastatin (ZOCOR) 40 MG tablet Take 40 mg by mouth at bedtime.     No facility-administered medications prior to visit.    PAST MEDICAL HISTORY: Past Medical History:  Diagnosis Date   Arthritis    HNP- low back    Asthma    Complication of anesthesia    pt. reports that when she wakes up she had a hard time catching her breath & the PACU team told her that her BP & pulse were not doing what it was suppose to do   Dyslipidemia 02/27/2016   GERD (gastroesophageal reflux disease)    Headache(784.0)    History of asthma    childhood, age 61   History of kidney stones about 2008ish   HNP (herniated nucleus pulposus), lumbar 06/30/2012   Hypertension    states she always has palpitations    PONV (postoperative nausea and vomiting)  Seizure (HCC)    Seizures (HCC) 02/2016   Stroke-like symptoms 02/27/2016    PAST SURGICAL HISTORY: Past Surgical History:  Procedure Laterality Date   ABDOMINAL HYSTERECTOMY     1993   BACK SURGERY     2000,01,10   CYSTOSCOPY/RETROGRADE/URETEROSCOPY/STONE EXTRACTION WITH BASKET Left 03/01/2013   Procedure: CYSTOSCOPY/RETROGRADE/URETEROSCOPY/STONE EXTRACTION WITH BASKET/INSERTION LEFT DOUBLE  J STENT;  Surgeon: Su Grand, MD;  Location: [facility];  Service: Urology;  Laterality: Left;   FINGER MASS EXCISION  2008   FOOT TENDON SURGERY  1985   HOLMIUM LASER APPLICATION Left 03/01/2013    Procedure: HOLMIUM LASER APPLICATION;  Surgeon: Su Grand, MD;  Location: [facility];  Service: Urology;  Laterality: Left;   INNER EAR SURGERY     x2   PARTIAL KNEE ARTHROPLASTY Left 10/04/2017   Procedure: UNICOMPARTMENTAL LEFT  KNEE;  Surgeon: Sheral Apley, MD;  Location: [facility];  Service: Orthopedics;  Laterality: Left;  Adductor Block   ROTATOR CUFF REPAIR     both shoulders done, last one - 2010   TONSILLECTOMY      FAMILY HISTORY: Family History  Problem Relation Age of Onset   Alzheimer's disease Mother    Aneurysm Brother        brain, age 107    SOCIAL HISTORY: Social History   Socioeconomic History   Marital status: Married    Spouse name: [name]   Number of children: 3   Years of education: 9.5   Highest education level: Not on file  Occupational History    Comment: retired  Tobacco Use   Smoking status: Never   Smokeless tobacco: Never  Vaping Use   Vaping Use: Never used  Substance and Sexual Activity   Alcohol use: No   Drug use: No   Sexual activity: Not on file  Other Topics Concern   Not on file  Social History Narrative   Lives with husband   Caffeine- coffee, 2-3 cups daily   Social Determinants of Health   Financial Resource Strain: Not on file  Food Insecurity: Not on file  Transportation Needs: Not on file  Physical Activity: Not on file  Stress: Not on file  Social Connections: Not on file  Intimate Partner Violence: Not on file     PHYSICAL EXAM There were no vitals filed for this visit.  There is no height or weight on file to calculate BMI.  General: well developed, well nourished, very pleasant elderly Caucasian female, seated, in no evident distress Head: head normocephalic and atraumatic.   Neck: supple with no carotid or supraclavicular bruits Cardiovascular: regular rate and rhythm, no murmurs Musculoskeletal: chronic left ankle deformity  Skin:  no rash/petichiae Vascular:  Normal pulses all extremities   Neurologic  Exam Mental Status: Awake and fully alert. Oriented to place and time. Recent and remote memory intact. Attention span, concentration and fund of knowledge appropriate. Mood and affect appropriate.  Cranial Nerves: Pupils equal, briskly reactive to light. Extraocular movements full without nystagmus. Visual fields full to confrontation. Hearing intact. Facial sensation intact. Face, tongue, palate moves normally and symmetrically.  Motor: Normal bulk and tone. Normal strength in all tested extremity muscles Sensory.: intact to touch , pinprick , position and vibratory sensation.  Coordination: Rapid alternating movements normal in all extremities. Finger-to-nose and heel-to-shin performed accurately bilaterally. Gait and Station: Arises from chair without difficulty. Stance is normal. Gait demonstrates normal stride length with mild favoring of left leg/ankle and use of cane Reflexes: 1+ and  symmetric. Toes downgoing.        DIAGNOSTIC DATA (LABS, IMAGING, TESTING) - I reviewed patient records, labs, notes, testing and imaging myself where available.        ASSESSMENT AND PLAN  81 y.o. year old female  has a past medical history of history of seizures 02/2016, HTN, HLD, and atrial fibrillation. No additional seizures since 10/2019 consisting of transient altered mental status likely in setting of family stressors     1. Seizure (HCC) Continue Keppra 500 mg twice daily for seizure prophylaxis - discussed possibly transiting to XR formulation to see if this helps with irritability concerns - she would like to hold off at this time Recent lab work completed by PCP Discussed seizure triggers/activities and importance of avoiding   Follow-up in 1 year or call earlier if needed   CC:  Blair Heys, MD    I spent 22 minutes of non-face-to-face and face-to-face time with patient.  This included previsit chart review, lab review, study review, order entry, electronic health record  documentation, patient education and discussion regarding history of seizures, ongoing use of AED and answered all other questions to patient satisfaction  Ihor Austin, [facility]  [facility] [address] [place], [place] [address]  Phone [phone] Fax [phone] Note: This document was prepared with digital dictation and possible smart phrase technology. Any transcriptional errors that result from this process are unintentional.

## 2022-01-19 ENCOUNTER — Ambulatory Visit: Payer: Medicare Other | Admitting: Adult Health

## 2022-01-19 ENCOUNTER — Encounter: Payer: Self-pay | Admitting: Adult Health

## 2022-01-19 VITALS — BP 170/92 | HR 70 | Ht 62.0 in | Wt 218.4 lb

## 2022-01-19 DIAGNOSIS — R569 Unspecified convulsions: Secondary | ICD-10-CM | POA: Diagnosis not present

## 2022-01-19 NOTE — Patient Instructions (Addendum)
Your Plan:  Continue Keppra 500mg  twice daily for seizure prevention  Please call with any additional seizure activity     Follow up in 1 year or call earlier if needed      Thank you for coming to see at [facility]. I hope we have been able to provide you high quality care today.  You may receive a patient satisfaction survey over the next few weeks. We would appreciate your feedback and comments so that we may continue to improve ourselves and the health of our patients.

## 2022-02-23 DIAGNOSIS — I10 Essential (primary) hypertension: Secondary | ICD-10-CM | POA: Diagnosis not present

## 2022-02-23 DIAGNOSIS — I4891 Unspecified atrial fibrillation: Secondary | ICD-10-CM | POA: Diagnosis not present

## 2022-02-23 DIAGNOSIS — E7800 Pure hypercholesterolemia, unspecified: Secondary | ICD-10-CM | POA: Diagnosis not present

## 2022-02-23 DIAGNOSIS — K219 Gastro-esophageal reflux disease without esophagitis: Secondary | ICD-10-CM | POA: Diagnosis not present

## 2022-04-14 ENCOUNTER — Telehealth: Payer: Self-pay | Admitting: *Deleted

## 2022-04-14 DIAGNOSIS — M1711 Unilateral primary osteoarthritis, right knee: Secondary | ICD-10-CM | POA: Diagnosis not present

## 2022-04-14 DIAGNOSIS — M25572 Pain in left ankle and joints of left foot: Secondary | ICD-10-CM | POA: Diagnosis not present

## 2022-04-14 NOTE — Telephone Encounter (Signed)
   Pre-operative Risk Assessment    Patient Name: Carol Gamble  DOB: 09-02-1940 MRN: [id number]      Request for Surgical Clearance    Procedure:   RIGHT TOTAL KNEE ARTHROPLASTY  Date of Surgery:  Clearance TBD                                 Surgeon:  DR. Edmonia Lynch  Surgeon's Group or Practice Name:  [facility] Phone number:  [phone] EXT [phone] ATTN: [place] Fax number:  [phone]   Type of Clearance Requested:   - Medical  - Pharmacy:  Hold Apixaban (Eliquis)     Type of Anesthesia:  Spinal   Additional requests/questions:    Jiles Prows   04/14/2022, 5:41 PM

## 2022-04-15 ENCOUNTER — Telehealth: Payer: Self-pay

## 2022-04-15 NOTE — Telephone Encounter (Signed)
Received surgical clearance form from [facility], will place form to complete on Jessica in basket.

## 2022-04-15 NOTE — Telephone Encounter (Signed)
Requested surgical clearance from [facility] for patient to undergo right total knee arthroplasty, date of surgery to be determined.  She is followed in this office for seizures, would recommend patient continue on Keppra 500 mg twice daily for seizure prophylaxis and ensure she takes morning dose prior to procedure.  She is otherwise cleared from a neurological standpoint.  Clearance to hold Eliquis will be determined by cardiology.  Please ensure this information is provided with clearance form.  Clearance form signed and placed in outbox.

## 2022-04-15 NOTE — Telephone Encounter (Signed)
Signed form and phone note faxed to the office. Received confirmation that it went through

## 2022-04-16 NOTE — Telephone Encounter (Signed)
Patient with diagnosis of PAF(paroxysmal atrial fibrillation) on Eliquis for anticoagulation.    Procedure: RIGHT TOTAL KNEE ARTHROPLASTY  Date of procedure: TBD   CHA2DS2-VASc Score = 7   This indicates a 11.2% annual risk of stroke. The patient's score is based upon: CHF History: 0 HTN History: 1 Diabetes History: 0 Stroke History: 2 Vascular Disease History: 1 Age Score: 2 Gender Score: 1     CrCl 52 mL/min (Sr Cr 0.93 on 09/11/2021 from [facility]) Platelet count 220 (08/14/2021 from [facility])    Per protocol for TKA hold Eliquis for 3 days prior however patient is at elevated risk off of anticoagulation based on history of afib with TIA. Will forward to MD to confirm 3 day hold is acceptable   **This guidance is not considered finalized until pre-operative APP has relayed final recommendations.**

## 2022-04-19 ENCOUNTER — Telehealth: Payer: Self-pay | Admitting: *Deleted

## 2022-04-19 ENCOUNTER — Encounter: Payer: Self-pay | Admitting: Adult Health

## 2022-04-19 ENCOUNTER — Ambulatory Visit: Payer: Medicare Other | Admitting: Family Medicine

## 2022-04-19 VITALS — BP 151/87 | Ht 61.5 in | Wt 217.0 lb

## 2022-04-19 DIAGNOSIS — G8929 Other chronic pain: Secondary | ICD-10-CM | POA: Diagnosis not present

## 2022-04-19 DIAGNOSIS — M25572 Pain in left ankle and joints of left foot: Secondary | ICD-10-CM | POA: Diagnosis not present

## 2022-04-19 DIAGNOSIS — R269 Unspecified abnormalities of gait and mobility: Secondary | ICD-10-CM | POA: Diagnosis not present

## 2022-04-19 NOTE — Telephone Encounter (Signed)
Pt has been scheduled for a tele visit, 04/22/22 2:20.  Consent on file / medications reconciled.

## 2022-04-19 NOTE — Telephone Encounter (Signed)
Pt has been scheduled for a tele visit, 04/22/22 2:20.  Consent on file / medications reconciled.    Patient Consent for Virtual Visit        Carol Gamble has provided verbal consent on 04/19/2022 for a virtual visit (video or telephone).   CONSENT FOR VIRTUAL VISIT FOR:  Carol Gamble  By participating in this virtual visit I agree to the following:  I hereby voluntarily request, consent and authorize [facility] and its employed or contracted physicians, physician assistants, nurse practitioners or other licensed health care professionals (the Practitioner), to provide me with telemedicine health care services (the "Services") as deemed necessary by the treating Practitioner. I acknowledge and consent to receive the Services by the Practitioner via telemedicine. I understand that the telemedicine visit will involve communicating with the Practitioner through live audiovisual communication technology and the disclosure of certain medical information by electronic transmission. I acknowledge that I have been given the opportunity to request an in-person assessment or other available alternative prior to the telemedicine visit and am voluntarily participating in the telemedicine visit.  I understand that I have the right to withhold or withdraw my consent to the use of telemedicine in the course of my care at any time, without affecting my right to future care or treatment, and that the Practitioner or I may terminate the telemedicine visit at any time. I understand that I have the right to inspect all information obtained and/or recorded in the course of the telemedicine visit and may receive copies of available information for a reasonable fee.  I understand that some of the potential risks of receiving the Services via telemedicine include:  Delay or interruption in medical evaluation due to technological equipment failure or disruption; Information transmitted may not be  sufficient (e.g. poor resolution of images) to allow for appropriate medical decision making by the Practitioner; and/or  In rare instances, security protocols could fail, causing a breach of personal health information.  Furthermore, I acknowledge that it is my responsibility to provide information about my medical history, conditions and care that is complete and accurate to the best of my ability. I acknowledge that Practitioner's advice, recommendations, and/or decision may be based on factors not within their control, such as incomplete or inaccurate data provided by me or distortions of diagnostic images or specimens that may result from electronic transmissions. I understand that the practice of medicine is not an exact science and that Practitioner makes no warranties or guarantees regarding treatment outcomes. I acknowledge that a copy of this consent can be made available to me via my patient portal ([facility]), or I can request a printed copy by calling the office of [facility].    I understand that my insurance will be billed for this visit.   I have read or had this consent read to me. I understand the contents of this consent, which adequately explains the benefits and risks of the Services being provided via telemedicine.  I have been provided ample opportunity to ask questions regarding this consent and the Services and have had my questions answered to my satisfaction. I give my informed consent for the services to be provided through the use of telemedicine in my medical care

## 2022-04-19 NOTE — Telephone Encounter (Signed)
   Name: Carol Gamble  DOB: [birth year]  MRN: [id number]  Primary Cardiologist: Sinclair Grooms, MD (Inactive)   Preoperative team, please contact this patient and set up a phone call appointment for further preoperative risk assessment. Please obtain consent and complete medication review. Thank you for your help.  I confirm that guidance regarding antiplatelet and oral anticoagulation therapy has been completed and, if necessary, noted below.  Per pharm D:  Patient with diagnosis of PAF(paroxysmal atrial fibrillation) on Eliquis for anticoagulation.     Procedure: RIGHT TOTAL KNEE ARTHROPLASTY  Date of procedure: TBD     CHA2DS2-VASc Score = 7   This indicates a 11.2% annual risk of stroke. The patient's score is based upon: CHF History: 0 HTN History: 1 Diabetes History: 0 Stroke History: 2 Vascular Disease History: 1 Age Score: 2 Gender Score: 1       CrCl 52 mL/min (Sr Cr 0.93 on 09/11/2021 from [facility]) Platelet count 220 (08/14/2021 from [facility])       Per protocol for TKA hold Eliquis for 3 days prior however patient is at elevated risk off of anticoagulation based on history of afib with TIA. Will forward to MD to confirm 3 day hold is acceptable   Mayra Reel, NP 04/19/2022, 10:35 AM [facility]

## 2022-04-20 ENCOUNTER — Encounter: Payer: Self-pay | Admitting: Family Medicine

## 2022-04-20 NOTE — Progress Notes (Signed)
PCP: Gaynelle Arabian, MD  Subjective:   HPI: Patient is a 82 y.o. female here for possible orthotics.  Patient reports issues with left foot/ankle dating back to when she was about 82 years old and injured this ankle. Had reconstructive surgery back in 1980s to try to give her more medial ankle support which seemed to work symptomatically until past few years. Was told there is nothing additional surgically that can be done for this ankle. She tried a rigid ankle brace from podiatry though this isn't the most comfortable thing and feels she needs more support. Walks around house with a wrap on her foot that has what sounds like a scaphoid pad in it. Sent here for consideration of custom orthotics.  Past Medical History:  Diagnosis Date   Arthritis    HNP- low back    Asthma    Complication of anesthesia    pt. reports that when she wakes up she had a hard time catching her breath & the PACU team told her that her BP & pulse were not doing what it was suppose to do   Dyslipidemia 02/27/2016   GERD (gastroesophageal reflux disease)    Headache(784.0)    History of asthma    childhood, age 74   History of kidney stones about 2008ish   HNP (herniated nucleus pulposus), lumbar 06/30/2012   Hypertension    states she always has palpitations    PONV (postoperative nausea and vomiting)    Seizure ([place])    Seizures ([place]) 02/2016   Stroke-like symptoms 02/27/2016    Current Outpatient Medications on File Prior to Visit  Medication Sig Dispense Refill   acetaminophen (TYLENOL) 500 MG tablet as needed.     apixaban (ELIQUIS) 5 MG TABS tablet Take 5 mg by mouth 2 (two) times daily.     Ascorbic Acid (VITA-C PO) Take 1 tablet by mouth daily.     atenolol (TENORMIN) 100 MG tablet TAKE 1 TABLET BY MOUTH DAILY 90 tablet 3   CALCIUM-VITAMIN D PO Take 1 tablet by mouth 2 (two) times daily.     fluticasone (FLONASE) 50 MCG/ACT nasal spray Place 2 sprays into both nostrils daily.     hydroxypropyl  methylcellulose (ISOPTO TEARS) 2.5 % ophthalmic solution Place 2 drops into both eyes 3 (three) times daily as needed for dry eyes.      levETIRAcetam (KEPPRA) 500 MG tablet TAKE 1 TABLET BY MOUTH TWICE  DAILY 200 tablet 2   lisinopril-hydrochlorothiazide (PRINZIDE,ZESTORETIC) 20-12.5 MG per tablet Take 1 tablet by mouth daily with breakfast.     Multiple Vitamin (MULTIVITAMIN WITH MINERALS) TABS Take 1 tablet by mouth daily.     Omega-3 Fatty Acids (FISH OIL) 1200 MG CPDR Take 1 capsule by mouth 2 (two) times daily.     omeprazole (PRILOSEC) 20 MG capsule Take 20 mg by mouth daily.     simvastatin (ZOCOR) 40 MG tablet Take 40 mg by mouth at bedtime.     No current facility-administered medications on file prior to visit.    Past Surgical History:  Procedure Laterality Date   ABDOMINAL HYSTERECTOMY     1993   BACK SURGERY     2000,01,10   CYSTOSCOPY/RETROGRADE/URETEROSCOPY/STONE EXTRACTION WITH BASKET Left 03/01/2013   Procedure: CYSTOSCOPY/RETROGRADE/URETEROSCOPY/STONE EXTRACTION WITH BASKET/INSERTION LEFT DOUBLE  J STENT;  Surgeon: Lowella Bandy, MD;  Location: [facility];  Service: Urology;  Laterality: Left;   FINGER MASS EXCISION  2008   FOOT TENDON SURGERY  1985   HOLMIUM LASER  APPLICATION Left [id number]   Procedure: HOLMIUM LASER APPLICATION;  Surgeon: Lowella Bandy, MD;  Location: [facility];  Service: Urology;  Laterality: Left;   INNER EAR SURGERY     x2   PARTIAL KNEE ARTHROPLASTY Left 10/04/2017   Procedure: UNICOMPARTMENTAL LEFT  KNEE;  Surgeon: Renette Butters, MD;  Location: [facility];  Service: Orthopedics;  Laterality: Left;  Adductor Block   ROTATOR CUFF REPAIR     both shoulders done, last one - 2010   TONSILLECTOMY      Allergies  Allergen Reactions   Propofol Nausea And Vomiting and Other (See Comments)    Difficulty waking up from anesthesia. Can't breath.   Shrimp [Shellfish Allergy] Hives    BP (!) 151/87   Ht 5' 1.5" (1.562 m)   Wt 217 lb (98.4 kg)   BMI 40.34 kg/m        No data to display              No data to display              Objective:  Physical Exam:  Gen: NAD, comfortable in exam room  Left foot/ankle: Well healed medial ankle scar.  Significant long arch collapse with external rotation of foot, navicular drop, medial shift of ankle, calcaneal valgus.  No bruising, erythema. No current tenderness to palpation. NVI distally.   Assessment & Plan:  1. Left foot/ankle deformity - we discussed options with [facility] which unfortunately are not excellent.  Surgery unlikely to help and has been recommended against in her case.  Believe best option would be combination of arch support and ankle brace.  ASO fitted today - can try velcro type with same support if she does well with this.  Sports insoles with large scaphoid pad made today as well - if she likes this we can make her custom orthotics in the future.  Otc medications only if needed.

## 2022-04-22 ENCOUNTER — Ambulatory Visit: Payer: Medicare Other | Attending: Internal Medicine | Admitting: Physician Assistant

## 2022-04-22 DIAGNOSIS — Z01810 Encounter for preprocedural cardiovascular examination: Secondary | ICD-10-CM | POA: Diagnosis not present

## 2022-04-22 NOTE — Progress Notes (Signed)
Virtual Visit via Telephone Note   Because of Carol Gamble's co-morbid illnesses, she is at least at moderate risk for complications without adequate follow up.  This format is felt to be most appropriate for this patient at this time.  The patient did not have access to video technology/had technical difficulties with video requiring transitioning to audio format only (telephone).  All issues noted in this document were discussed and addressed.  No physical exam could be performed with this format.  Please refer to the patient's chart for her consent to telehealth for [facility].  Evaluation Performed:  Preoperative cardiovascular risk assessment _____________   Date:  04/22/2022   Patient ID:  Carol Gamble 01, 1943, MRN [id number] Patient Location:  Home Provider location:   Office  Primary Care Provider:  Gaynelle Arabian, MD Primary Cardiologist:  Sinclair Grooms, MD (Inactive)  Chief Complaint / Patient Profile   82 y.o. y/o female with a h/o  who is pending persistent atrial fibrillation, hypertension, TIA morbid obesity, and anticoagulation who presents today for telephonic preoperative cardiovascular risk assessment.  History of Present Illness    Carol Gamble is a 82 y.o. female who presents via [employer] (consulting) for a telehealth visit today.  Pt was last seen in cardiology clinic on 07/30/21 by Dr. Tamala Julian.  At that time [name] was doing well.  The patient is now pending procedure as outlined above. Since her last visit, she states that she is having some palpitations when she goes to bed at night but none during the day.  She has had these for years.  When she last saw Dr. Tamala Julian summer of last year he increased her atenolol.  He also obtain an echocardiogram in 2022 which showed normal heart pump function and severely dilated left and right atrium which was indicative of her having atrial fibrillation for a long time.  She does get  around her house using a cane.  She is able to use a ramp out front but does not have any stairs in her home.  Per protocol for TKA hold Eliquis for 3 days prior however patient is at elevated risk off of anticoagulation based on history of afib with TIA.  Per MD, okay to hold Eliquis x 3 days and restart when medically safe to do so.  Past Medical History    Past Medical History:  Diagnosis Date   Arthritis    HNP- low back    Asthma    Complication of anesthesia    pt. reports that when she wakes up she had a hard time catching her breath & the PACU team told her that her BP & pulse were not doing what it was suppose to do   Dyslipidemia 02/27/2016   GERD (gastroesophageal reflux disease)    Headache(784.0)    History of asthma    childhood, age 59   History of kidney stones about 2008ish   HNP (herniated nucleus pulposus), lumbar 06/30/2012   Hypertension    states she always has palpitations    PONV (postoperative nausea and vomiting)    Seizure ([place])    Seizures ([place]) 02/2016   Stroke-like symptoms 02/27/2016   Past Surgical History:  Procedure Laterality Date   ABDOMINAL HYSTERECTOMY     1993   BACK SURGERY     2000,01,10   CYSTOSCOPY/RETROGRADE/URETEROSCOPY/STONE EXTRACTION WITH BASKET Left 03/01/2013   Procedure: CYSTOSCOPY/RETROGRADE/URETEROSCOPY/STONE EXTRACTION WITH BASKET/INSERTION LEFT DOUBLE  J STENT;  Surgeon: Altamese Dilling  Nesi, MD;  Location: [facility];  Service: Urology;  Laterality: Left;   FINGER MASS EXCISION  2008   FOOT TENDON SURGERY  1985   HOLMIUM LASER APPLICATION Left [id number]   Procedure: HOLMIUM LASER APPLICATION;  Surgeon: Lowella Bandy, MD;  Location: [facility];  Service: Urology;  Laterality: Left;   INNER EAR SURGERY     x2   PARTIAL KNEE ARTHROPLASTY Left 10/04/2017   Procedure: UNICOMPARTMENTAL LEFT  KNEE;  Surgeon: Renette Butters, MD;  Location: [facility];  Service: Orthopedics;  Laterality: Left;  Adductor Block   ROTATOR CUFF REPAIR     both shoulders done,  last one - 2010   TONSILLECTOMY      Allergies  Allergies  Allergen Reactions   Propofol Nausea And Vomiting and Other (See Comments)    Difficulty waking up from anesthesia. Can't breath.   Shrimp [Shellfish Allergy] Hives    Home Medications    Prior to Admission medications   Medication Sig Start Date End Date Taking? Authorizing Provider  acetaminophen (TYLENOL) 500 MG tablet as needed. 08/29/17   [provider]  apixaban (ELIQUIS) 5 MG TABS tablet Take 5 mg by mouth 2 (two) times daily.    [provider]  Ascorbic Acid (VITA-C PO) Take 1 tablet by mouth daily.    [provider]  atenolol (TENORMIN) 100 MG tablet TAKE 1 TABLET BY MOUTH DAILY 09/03/21   Belva Crome, MD  CALCIUM-VITAMIN D PO Take 1 tablet by mouth 2 (two) times daily.    [provider]  fluticasone (FLONASE) 50 MCG/ACT nasal spray Place 2 sprays into both nostrils daily.    [provider]  hydroxypropyl methylcellulose (ISOPTO TEARS) 2.5 % ophthalmic solution Place 2 drops into both eyes 3 (three) times daily as needed for dry eyes.     [provider]  levETIRAcetam (KEPPRA) 500 MG tablet TAKE 1 TABLET BY MOUTH TWICE  DAILY 11/09/21   Frann Rider, NP  lisinopril-hydrochlorothiazide (PRINZIDE,ZESTORETIC) 20-12.5 MG per tablet Take 1 tablet by mouth daily with breakfast.    [provider]  Multiple Vitamin (MULTIVITAMIN WITH MINERALS) TABS Take 1 tablet by mouth daily.    [provider]  Omega-3 Fatty Acids (FISH OIL) 1200 MG CPDR Take 1 capsule by mouth 2 (two) times daily.    [provider]  omeprazole (PRILOSEC) 20 MG capsule Take 20 mg by mouth daily. 10/02/18   [provider]  simvastatin (ZOCOR) 40 MG tablet Take 40 mg by mouth at bedtime.    [provider]    Physical Exam    Vital Signs:  [name] does not have vital signs available for review today.  Given telephonic nature of  communication, physical exam is limited. AAOx3. NAD. Normal affect.  Speech and respirations are unlabored.  Accessory Clinical Findings    None  Assessment & Plan    1.  Preoperative Cardiovascular Risk Assessment:  Ms. Chino perioperative risk of a major cardiac event is 0.9% according to the Revised Cardiac Risk Index (RCRI).  Therefore, she is at low risk for perioperative complications.   Her functional capacity is fair at 4.64 METs according to the Duke Activity Status Index (DASI). Recommendations: According to ACC/AHA guidelines, no further cardiovascular testing needed.  The patient may proceed to surgery at acceptable risk.   Antiplatelet and/or Anticoagulation Recommendations:  Eliquis (Apixaban) can be held for 3 days prior to surgery.  Please resume post op when felt to be  safe.    The patient was advised that if she develops new symptoms prior to surgery to contact our office to arrange for a follow-up visit, and she verbalized understanding.  A copy of this note will be routed to requesting surgeon.  Time:   Today, I have spent 10 minutes with the patient with telehealth technology discussing medical history, symptoms, and management plan.     Elgie Collard, PA-C  04/22/2022, 2:26 PM

## 2022-04-28 DIAGNOSIS — M1711 Unilateral primary osteoarthritis, right knee: Secondary | ICD-10-CM | POA: Diagnosis not present

## 2022-04-29 NOTE — Progress Notes (Signed)
Surgery orders requested via Epic inbox. °

## 2022-05-03 DIAGNOSIS — M26609 Unspecified temporomandibular joint disorder, unspecified side: Secondary | ICD-10-CM | POA: Diagnosis not present

## 2022-05-03 NOTE — H&P (Signed)
KNEE ARTHROPLASTY ADMISSION H&P  Patient ID: Carol Gamble MRN: [id number] DOB/AGE: [birth year] 81 y.o.  Chief Complaint: right knee pain.  Planned Procedure Date: 05/18/22 Medical Clearance by Dr. Blair Heys   Cardiac Clearance by Dr. Verdis Prime Neurology clearance by Ihor Austin NP   HPI: Carol Gamble is a 82 y.o. female who presents for evaluation of OA RIGHT KNEE. The patient has a history of pain and functional disability in the right knee due to arthritis and has failed non-surgical conservative treatments for greater than 12 weeks to include NSAID's and/or analgesics, use of assistive devices, and activity modification.  Onset of symptoms was gradual, starting 1 years ago with gradually worsening course since that time. The patient noted no past surgery on the right knee.  Patient currently rates pain at 10 out of 10 with activity. Patient has night pain, worsening of pain with activity and weight bearing, and pain that interferes with activities of daily living.  Patient has evidence of subchondral sclerosis, periarticular osteophytes, and joint space narrowing by imaging studies.  There is no active infection.  Past Medical History:  Diagnosis Date   Arthritis    HNP- low back    Asthma    Complication of anesthesia    pt. reports that when she wakes up she had a hard time catching her breath & the PACU team told her that her BP & pulse were not doing what it was suppose to do   Dyslipidemia 02/27/2016   GERD (gastroesophageal reflux disease)    Headache(784.0)    History of asthma    childhood, age 37   History of kidney stones about 2008ish   HNP (herniated nucleus pulposus), lumbar 06/30/2012   Hypertension    states she always has palpitations    PONV (postoperative nausea and vomiting)    Seizure (HCC)    Seizures (HCC) 02/2016   Stroke-like symptoms 02/27/2016   Past Surgical History:  Procedure Laterality Date   ABDOMINAL HYSTERECTOMY     1993   BACK  SURGERY     2000,01,10   CYSTOSCOPY/RETROGRADE/URETEROSCOPY/STONE EXTRACTION WITH BASKET Left 03/01/2013   Procedure: CYSTOSCOPY/RETROGRADE/URETEROSCOPY/STONE EXTRACTION WITH BASKET/INSERTION LEFT DOUBLE  J STENT;  Surgeon: Su Grand, MD;  Location: [facility];  Service: Urology;  Laterality: Left;   FINGER MASS EXCISION  2008   FOOT TENDON SURGERY  1985   HOLMIUM LASER APPLICATION Left 03/01/2013   Procedure: HOLMIUM LASER APPLICATION;  Surgeon: Su Grand, MD;  Location: [facility];  Service: Urology;  Laterality: Left;   INNER EAR SURGERY     x2   PARTIAL KNEE ARTHROPLASTY Left 10/04/2017   Procedure: UNICOMPARTMENTAL LEFT  KNEE;  Surgeon: Sheral Apley, MD;  Location: [facility];  Service: Orthopedics;  Laterality: Left;  Adductor Block   ROTATOR CUFF REPAIR     both shoulders done, last one - 2010   TONSILLECTOMY     Allergies  Allergen Reactions   Propofol Nausea And Vomiting and Other (See Comments)    Difficulty waking up from anesthesia. Can't breath.   Shrimp [Shellfish Allergy] Hives   Prior to Admission medications   Medication Sig Start Date End Date Taking? Authorizing Provider  acetaminophen (TYLENOL) 500 MG tablet as needed. 08/29/17   [provider]  apixaban (ELIQUIS) 5 MG TABS tablet Take 5 mg by mouth 2 (two) times daily.    [provider]  Ascorbic Acid (VITA-C PO) Take 1 tablet by mouth daily.    [provider]  atenolol (TENORMIN) 100 MG tablet TAKE 1 TABLET BY MOUTH DAILY 09/03/21   Lyn Records, MD  CALCIUM-VITAMIN D PO Take 1 tablet by mouth 2 (two) times daily.    [provider]  fluticasone (FLONASE) 50 MCG/ACT nasal spray Place 2 sprays into both nostrils daily.    [provider]  hydroxypropyl methylcellulose (ISOPTO TEARS) 2.5 % ophthalmic solution Place 2 drops into both eyes 3 (three) times daily as needed for dry eyes.     [provider]  levETIRAcetam (KEPPRA) 500 MG tablet TAKE 1 TABLET BY MOUTH TWICE   DAILY 11/09/21   Ihor Austin, NP  lisinopril-hydrochlorothiazide (PRINZIDE,ZESTORETIC) 20-12.5 MG per tablet Take 1 tablet by mouth daily with breakfast.    [provider]  Multiple Vitamin (MULTIVITAMIN WITH MINERALS) TABS Take 1 tablet by mouth daily.    [provider]  Omega-3 Fatty Acids (FISH OIL) 1200 MG CPDR Take 1 capsule by mouth 2 (two) times daily.    [provider]  omeprazole (PRILOSEC) 20 MG capsule Take 20 mg by mouth daily. 10/02/18   [provider]  simvastatin (ZOCOR) 40 MG tablet Take 40 mg by mouth at bedtime.    [provider]   Social History   Socioeconomic History   Marital status: Married    Spouse name: Carol Gamble   Number of children: 3   Years of education: 9.5   Highest education level: Not on file  Occupational History    Comment: retired  Tobacco Use   Smoking status: Never   Smokeless tobacco: Never  Vaping Use   Vaping Use: Never used  Substance and Sexual Activity   Alcohol use: No   Drug use: No   Sexual activity: Not on file  Other Topics Concern   Not on file  Social History Narrative   Lives with husband   Caffeine- coffee, 2-3 cups daily   Social Determinants of Health   Financial Resource Strain: Not on file  Food Insecurity: Not on file  Transportation Needs: Not on file  Physical Activity: Not on file  Stress: Not on file  Social Connections: Not on file   Family History  Problem Relation Age of Onset   Alzheimer's disease Mother    Aneurysm Brother        brain, age 72    ROS: Currently denies lightheadedness, dizziness, Fever, chills, CP, SOB.   No personal history of DVT, PE, MI, or CVA. No loose teeth. Partial dentures are present All other systems have been reviewed and were otherwise currently negative with the exception of those mentioned in the HPI and as above.  Objective: Vitals: Ht: 5'3" Wt: 216 lbs Temp: 97.8 BP: 153/91 Pulse: 74 O2 96% on room air.   Physical  Exam: General: Alert, NAD.  Antalgic Gait  HEENT: EOMI, Good Neck Extension  Pulm: No increased work of breathing. Slight expiratory wheezing in all lung fields. Clear B/L A/P otherwise  CV: RRR, No m/g/r appreciated  GI: soft, NT, ND. BS x 4 quadrants Neuro: CN II-XII grossly intact without focal deficit.  Sensation intact distally Skin: No lesions in the area of chief complaint MSK/Surgical Site:  + JLT. ROM slow and from 0-110 degrees.  Decreased strength in extension and flexion.  +EHL/FHL.  NVI.  Mild pain and instability with varus and valgus stress.    Imaging Review Plain radiographs demonstrate moderate degenerative joint disease of the right knee.   The overall alignment ismild varus. The bone quality  appears to be fair for age and reported activity level.  Preoperative templating of the joint replacement has been completed, documented, and submitted to the Operating Room personnel in order to optimize intra-operative equipment management.  Assessment: OA RIGHT KNEE Active Problems:   * No active hospital problems. *   Plan: Plan for Procedure(s): TOTAL KNEE ARTHROPLASTY  The patient history, physical exam, clinical judgement of the provider and imaging are consistent with end stage degenerative joint disease and total joint arthroplasty is deemed medically necessary. The treatment options including medical management, injection therapy, and arthroplasty were discussed at length. The risks and benefits of Procedure(s): TOTAL KNEE ARTHROPLASTY were presented and reviewed.  The risks of nonoperative treatment, versus surgical intervention including but not limited to continued pain, aseptic loosening, stiffness, dislocation/subluxation, infection, bleeding, nerve injury, blood clots, cardiopulmonary complications, morbidity, mortality, among others were discussed. The patient verbalizes understanding and wishes to proceed with the plan.  Patient is being admitted for inpatient  treatment for surgery, pain control, PT, prophylactic antibiotics, VTE prophylaxis, progressive ambulation, ADL's and discharge planning.   Dental prophylaxis discussed and recommended for 2 years postoperatively.  The patient does meet the criteria for TXA which will be used perioperatively.   Her normal daily Eliquis 5mg  bid will be used postoperatively for DVT prophylaxis in addition to SCDs, and early ambulation. Plan for Tylenol and oxycodone for pain.   Robaxin for muscle spasms.   Zofran for nausea and vomiting. Miralax is for constipation prevention Pharmacy- [facility] on [address] in [place] The patient is planning to be discharged home with OPPT and into the care of her husband Lorella Nimrod who can be reached at [phone] Follow up appt 06/02/22 at [facility] Office [phone] 05/03/2022 5:34 PM

## 2022-05-06 NOTE — Progress Notes (Addendum)
COVID Vaccine Completed:  Yes  Date of COVID positive in last 90 days:  No  PCP - Blair Heysobert Ehinger, MD Cardiologist - Jari Favreessa Conte, PA-C (previous Dr. Verdis PrimeHenry Smith) Neurologist - Ihor AustinJessica McCue, NP/Dr. Pearlean BrownieSethi  Cardiac clearance in Epic dated 04-22-22 by Jari Favreessa Conte, PA-C  Chest x-ray - N/A EKG - 07-30-21 Epic Stress Test - N/A ECHO - 06-09-20 Epic Cardiac Cath - N/A Pacemaker/ICD device last checked: Spinal Cord Stimulator:N/A Long Term Monitor - 02-23-19 Epic  Bowel Prep - N/A  Sleep Study - N/A CPAP -    Prediabetic Fasting Blood Sugar -  Checks Blood Sugar - does not check   Last dose of GLP1 agonist-  N/A GLP1 instructions:  N/A   Last dose of SGLT-2 inhibitors-  N/A SGLT-2 instructions: N/A  Blood Thinner Instructions:  Eliquis.  Hold x3 days.  Pt aware Aspirin Instructions: Last Dose:  Activity level:  Can go up a flight of stairs and perform activities of daily living without stopping and without symptoms of chest pain or shortness of breath.  Some limitations due to knee pain  Anesthesia review:  Afib, HTN  Seizure hx -  last seizure 01/2022, on Keppra  Recently diagnosed with TMJ, no difficulty opening mouth, just has a headache and some popping of the jaw. She knows to call her PCP if symptoms worsen  Patient denies shortness of breath, fever, cough and chest pain at PAT appointment  Patient verbalized understanding of instructions that were given to them at the PAT appointment. Patient was also instructed that they will need to review over the PAT instructions again at home before surgery.

## 2022-05-06 NOTE — Patient Instructions (Addendum)
SURGICAL WAITING ROOM VISITATION  Patients having surgery or a procedure may have no more than 2 support people in the waiting area - these visitors may rotate.    Children under the age of 60 must have an adult with them who is not the patient.  Due to an increase in RSV and influenza rates and associated hospitalizations, children ages 85 and under may not visit patients in [place].  If the patient needs to stay at the hospital during part of their recovery, the visitor guidelines for inpatient rooms apply. Pre-op nurse will coordinate an appropriate time for 1 support person to accompany patient in pre-op.  This support person may not rotate.    Please refer to the [facility] website for the visitor guidelines for Inpatients (after your surgery is over and you are in a regular room).      Your procedure is scheduled on: 05-18-22   Report to [facility] Main Entrance    Report to admitting at 6:15 AM   Call this number if you have problems the morning of surgery [phone]   Do not eat food :After Midnight.   After Midnight you may have the following liquids until 5:45 AM DAY OF SURGERY  Water Non-Citrus Juices (without pulp, NO RED-Apple, White grape, White cranberry) Black Coffee (NO MILK/CREAM OR CREAMERS, sugar ok)  Clear Tea (NO MILK/CREAM OR CREAMERS, sugar ok) regular and decaf                             Plain Jell-O (NO RED)                                           Fruit ices (not with fruit pulp, NO RED)                                     Popsicles (NO RED)                                                               Sports drinks like Gatorade (NO RED)                   The day of surgery:  Drink ONE (1) Pre-Surgery G2 at 5:45 AM the morning of surgery. Drink in one sitting. Do not sip.  This drink was given to you during your hospital  pre-op appointment visit. Nothing else to drink after completing the Pre-Surgery Clear Ensure .           If you have questions, please contact your surgeon's office.   FOLLOW  ANY ADDITIONAL PRE OP INSTRUCTIONS YOU RECEIVED FROM YOUR SURGEON'S OFFICE!!!     Oral Hygiene is also important to reduce your risk of infection.                                    Remember - BRUSH YOUR TEETH THE MORNING OF SURGERY WITH YOUR REGULAR TOOTHPASTE  DENTURES  WILL BE REMOVED PRIOR TO SURGERY PLEASE DO NOT APPLY "Poly grip" OR ADHESIVES!!!   Do NOT smoke after Midnight   Take these medicines the morning of surgery with A SIP OF WATER:   Atenolol  Keppra  Omeprazole  Okay to use nasal spray and eyedrops   Hold Eliquis 3 days before surgery (do not take after 05-14-22)                              You may not have any metal on your body including hair pins, jewelry, and body piercing             Do not wear make-up, lotions, powders, perfumes or deodorant  Do not wear nail polish including gel and S&S, artificial/acrylic nails, or any other type of covering on natural nails including finger and toenails. If you have artificial nails, gel coating, etc. that needs to be removed by a nail salon please have this removed prior to surgery or surgery may need to be canceled/ delayed if the surgeon/ anesthesia feels like they are unable to be safely monitored.   Do not shave  48 hours prior to surgery.    Do not bring valuables to the hospital. [facility] IS NOT RESPONSIBLE   FOR VALUABLES.   Contacts, glasses, dentures or bridgework may not be worn into surgery.  DO NOT BRING YOUR HOME MEDICATIONS TO THE HOSPITAL. PHARMACY WILL DISPENSE MEDICATIONS LISTED ON YOUR MEDICATION LIST TO YOU DURING YOUR ADMISSION IN THE HOSPITAL!    Patients discharged on the day of surgery will not be allowed to drive home.  Someone NEEDS to stay with you for the first 24 hours after anesthesia.   Special Instructions: Bring a copy of your healthcare power of attorney and living will documents the day of surgery if you  haven't scanned them before.              Please read over the following fact sheets you were given: IF YOU HAVE QUESTIONS ABOUT YOUR PRE-OP INSTRUCTIONS PLEASE CALL [phone] [facility]   If you received a COVID test during your pre-op visit  it is requested that you wear a mask when out in public, stay away from anyone that may not be feeling well and notify your surgeon if you develop symptoms. If you test positive for Covid or have been in contact with anyone that has tested positive in the last 10 days please notify you surgeon.      Incentive Spirometer  An incentive spirometer is a tool that can help keep your lungs clear and active. This tool measures how well you are filling your lungs with each breath. Taking long deep breaths may help reverse or decrease the chance of developing breathing (pulmonary) problems (especially infection) following: A long period of time when you are unable to move or be active. BEFORE THE PROCEDURE  If the spirometer includes an indicator to show your best effort, your nurse or respiratory therapist will set it to a desired goal. If possible, sit up straight or lean slightly forward. Try not to slouch. Hold the incentive spirometer in an upright position. INSTRUCTIONS FOR USE  Sit on the edge of your bed if possible, or sit up as far as you can in bed or on a chair. Hold the incentive spirometer in an upright position. Breathe out normally. Place the mouthpiece in your mouth and seal your lips tightly around it.  Breathe in slowly and as deeply as possible, raising the piston or the ball toward the top of the column. Hold your breath for 3-5 seconds or for as long as possible. Allow the piston or ball to fall to the bottom of the column. Remove the mouthpiece from your mouth and breathe out normally. Rest for a few seconds and repeat Steps 1 through 7 at least 10 times every 1-2 hours when you are awake. Take your time and take a few normal breaths between  deep breaths. The spirometer may include an indicator to show your best effort. Use the indicator as a goal to work toward during each repetition. After each set of 10 deep breaths, practice coughing to be sure your lungs are clear. If you have an incision (the cut made at the time of surgery), support your incision when coughing by placing a pillow or rolled up towels firmly against it. Once you are able to get out of bed, walk around indoors and cough well. You may stop using the incentive spirometer when instructed by your caregiver.  RISKS AND COMPLICATIONS Take your time so you do not get dizzy or light-headed. If you are in pain, you may need to take or ask for pain medication before doing incentive spirometry. It is harder to take a deep breath if you are having pain. AFTER USE Rest and breathe slowly and easily. It can be helpful to keep track of a log of your progress. Your caregiver can provide you with a simple table to help with this. If you are using the spirometer at home, follow these instructions: SEEK MEDICAL CARE IF:  You are having difficultly using the spirometer. You have trouble using the spirometer as often as instructed. Your pain medication is not giving enough relief while using the spirometer. You develop fever of 100.5 F (38.1 C) or higher. SEEK IMMEDIATE MEDICAL CARE IF:  You cough up bloody sputum that had not been present before. You develop fever of 102 F (38.9 C) or greater. You develop worsening pain at or near the incision site. MAKE SURE YOU:  Understand these instructions. Will watch your condition. Will get help right away if you are not doing well or get worse. Document Released: 05/31/2006 Document Revised: 04/12/2011 Document Reviewed: 08/01/2006 [facility] Patient Information 2014 [place], [place].   ________________________________________________________________________

## 2022-05-07 ENCOUNTER — Encounter (HOSPITAL_COMMUNITY): Payer: Self-pay

## 2022-05-07 ENCOUNTER — Encounter (HOSPITAL_COMMUNITY)
Admission: RE | Admit: 2022-05-07 | Discharge: 2022-05-07 | Disposition: A | Discharge status: Home / Self Care | Payer: Medicare Other | Source: Ambulatory Visit | Attending: Orthopedic Surgery | Admitting: Orthopedic Surgery

## 2022-05-07 ENCOUNTER — Other Ambulatory Visit: Payer: Self-pay

## 2022-05-07 VITALS — BP 141/82 | HR 81 | Temp 97.7°F | Resp 16 | Ht 61.5 in | Wt 214.6 lb

## 2022-05-07 DIAGNOSIS — R569 Unspecified convulsions: Secondary | ICD-10-CM | POA: Diagnosis not present

## 2022-05-07 DIAGNOSIS — Z8673 Personal history of transient ischemic attack (TIA), and cerebral infarction without residual deficits: Secondary | ICD-10-CM | POA: Insufficient documentation

## 2022-05-07 DIAGNOSIS — Z7901 Long term (current) use of anticoagulants: Secondary | ICD-10-CM | POA: Insufficient documentation

## 2022-05-07 DIAGNOSIS — I10 Essential (primary) hypertension: Secondary | ICD-10-CM | POA: Insufficient documentation

## 2022-05-07 DIAGNOSIS — R7303 Prediabetes: Secondary | ICD-10-CM | POA: Insufficient documentation

## 2022-05-07 DIAGNOSIS — I4891 Unspecified atrial fibrillation: Secondary | ICD-10-CM | POA: Insufficient documentation

## 2022-05-07 DIAGNOSIS — Z79899 Other long term (current) drug therapy: Secondary | ICD-10-CM | POA: Insufficient documentation

## 2022-05-07 DIAGNOSIS — Z01818 Encounter for other preprocedural examination: Secondary | ICD-10-CM | POA: Diagnosis not present

## 2022-05-07 DIAGNOSIS — M1711 Unilateral primary osteoarthritis, right knee: Secondary | ICD-10-CM | POA: Insufficient documentation

## 2022-05-07 HISTORY — DX: Anxiety disorder, unspecified: F41.9

## 2022-05-07 HISTORY — DX: Anemia, unspecified: D64.9

## 2022-05-07 HISTORY — DX: Depression, unspecified: F32.A

## 2022-05-07 HISTORY — DX: Unspecified atrial fibrillation: I48.91

## 2022-05-07 LAB — HEMOGLOBIN A1C
Hgb A1c MFr Bld: 5.7 % — ABNORMAL HIGH (ref 4.8–5.6)
Mean Plasma Glucose: 116.89 mg/dL

## 2022-05-07 LAB — CBC
HCT: 46.5 % — ABNORMAL HIGH (ref 36.0–46.0)
Hemoglobin: 15.3 g/dL — ABNORMAL HIGH (ref 12.0–15.0)
MCH: 30.5 pg (ref 26.0–34.0)
MCHC: 32.9 g/dL (ref 30.0–36.0)
MCV: 92.8 fL (ref 80.0–100.0)
Platelets: 227 10*3/uL (ref 150–400)
RBC: 5.01 MIL/uL (ref 3.87–5.11)
RDW: 13.9 % (ref 11.5–15.5)
WBC: 6.9 10*3/uL (ref 4.0–10.5)
nRBC: 0 % (ref 0.0–0.2)

## 2022-05-07 LAB — BASIC METABOLIC PANEL
Anion gap: 10 (ref 5–15)
BUN: 16 mg/dL (ref 8–23)
CO2: 26 mmol/L (ref 22–32)
Calcium: 9.9 mg/dL (ref 8.9–10.3)
Chloride: 105 mmol/L (ref 98–111)
Creatinine, Ser: 0.94 mg/dL (ref 0.44–1.00)
GFR, Estimated: >60 mL/min (ref >60)
Glucose, Bld: 106 mg/dL — ABNORMAL HIGH (ref 70–99)
Potassium: 4.6 mmol/L (ref 3.5–5.1)
Sodium: 141 mmol/L (ref 135–145)

## 2022-05-07 LAB — GLUCOSE, CAPILLARY: Glucose-Capillary: 130 mg/dL — ABNORMAL HIGH (ref 70–99)

## 2022-05-07 LAB — SURGICAL PCR SCREEN
MRSA, PCR: NEGATIVE
Staphylococcus aureus: NEGATIVE

## 2022-05-11 NOTE — Progress Notes (Addendum)
Case: [id number] Date/Time: 05/18/22 0835   Procedure: TOTAL KNEE ARTHROPLASTY (Right: Knee)   Anesthesia type: Spinal   Pre-op diagnosis: OA RIGHT KNEE   Location: [facility] 07 [facility]   Surgeons: Sheral Apley, MD       DISCUSSION: 82 year old female who presents for R TKA on 05/18/22. PMH significant for A.fib on chronic anticoagulation (Eliquis), HTN, hx of TIA, hx of seizures (on Keppra). Anesthesia complications notable for post-op nausea and vomiting.  #Persistent A.fib on Eliquis -Last seen by Dr. Katrinka Blazing on 07/30/21, was doing well/stable at that time  #Preoperative Cardiovascular Risk Assessment:   "Ms. Coutts's perioperative risk of a major cardiac event is 0.9% according to the Revised Cardiac Risk Index (RCRI).  Therefore, she is at low risk for perioperative complications.   Her functional capacity is fair at 4.64 METs according to the Duke Activity Status Index (DASI).  Recommendations: According to ACC/AHA guidelines, no further cardiovascular testing needed.  The patient may proceed to surgery at acceptable risk.   Antiplatelet and/or Anticoagulation Recommendations: Eliquis (Apixaban) can be held for 3 days prior to surgery.  Please resume post op when felt to be safe.     The patient was advised that if she develops new symptoms prior to surgery to contact our office to arrange for a follow-up visit, and she verbalized understanding."  #HTN -Controlled on current regimen  #Seizures -On Keppra, follows with Neurology (last seen 01/19/22) -Clearance noted in note from 04/15/22:  "Requested surgical clearance from [facility] for patient to undergo right total knee arthroplasty, date of surgery to be determined.  She is followed in this office for seizures, would recommend patient continue on Keppra 500 mg twice daily for seizure prophylaxis and ensure she takes morning dose prior to procedure.  She is otherwise cleared from a neurological standpoint.   Clearance to hold Eliquis will be determined by cardiology."  VS: BP (!) 141/82   Pulse 81   Temp 36.5 C (Oral)   Resp 16   Ht 5' 1.5" (1.562 m)   Wt 97.3 kg   SpO2 98%   BMI 39.89 kg/m   PROVIDERS: Blair Heys, MD Cardiology: Verdis Prime, MD Neurology: Delia Heady, MD  LABS: Labs reviewed: Acceptable for surgery. (all labs ordered are listed, but only abnormal results are displayed)  Labs Reviewed  HEMOGLOBIN A1C - Abnormal; Notable for the following components:      Result Value   Hgb A1c MFr Bld 5.7 (*)    All other components within normal limits  BASIC METABOLIC PANEL - Abnormal; Notable for the following components:   Glucose, Bld 106 (*)    All other components within normal limits  CBC - Abnormal; Notable for the following components:   Hemoglobin 15.3 (*)    HCT 46.5 (*)    All other components within normal limits  GLUCOSE, CAPILLARY - Abnormal; Notable for the following components:   Glucose-Capillary 130 (*)    All other components within normal limits  SURGICAL PCR SCREEN     IMAGES: n/a   EKG 07/30/21:  A.fib with premature aberrantly conducted complexes RSR' or QR pattern in V1 suggests right ventricular conduction delay No change compared with prior  CV:  Echo 06/09/20:  IMPRESSIONS     1. Left ventricular ejection fraction, by estimation, is 60 to 65%. The  left ventricle has normal function. The left ventricle has no regional  wall motion abnormalities. Left ventricular diastolic parameters are  indeterminate.   2. Right ventricular systolic function is normal. The right ventricular  size is moderately enlarged. There is normal pulmonary artery systolic  pressure.   3. Left atrial size was severely dilated.   4. Right atrial size was severely dilated.   5. The mitral valve is normal in structure. No evidence of mitral valve  regurgitation. No evidence of mitral stenosis.   6. The aortic valve is calcified. There is mild  calcification of the  aortic valve. There is mild thickening of the aortic valve. Aortic valve  regurgitation is not visualized. Mild aortic valve sclerosis is present,  with no evidence of aortic valve  stenosis.   7. The inferior vena cava is normal in size with greater than 50%  respiratory variability, suggesting right atrial pressure of 3 mmHg.   Holter monitor 02/23/19:  Continuous course atrial fibrillation throughout duration of this monitor. Heart rate ranges between 50 and 150 bpm. Average heart rate 88 bpm.     Past Medical History:  Diagnosis Date   A-fib    Anemia    Anxiety    Arthritis    HNP- low back    Asthma    Child   Complication of anesthesia    pt. reports that when she wakes up she had a hard time catching her breath & the PACU team told her that her BP & pulse were not doing what it was suppose to do   Depression    Dyslipidemia 02/27/2016   GERD (gastroesophageal reflux disease)    Headache(784.0)    History of asthma    childhood, age 59   History of kidney stones about 2008ish   HNP (herniated nucleus pulposus), lumbar 06/30/2012   Hypertension    states she always has palpitations    PONV (postoperative nausea and vomiting)    Seizure    Seizures 02/2016   Stroke-like symptoms 02/27/2016    Past Surgical History:  Procedure Laterality Date   ABDOMINAL HYSTERECTOMY     1993   BACK SURGERY     2000,01,10   CYSTOSCOPY/RETROGRADE/URETEROSCOPY/STONE EXTRACTION WITH BASKET Left 03/01/2013   Procedure: CYSTOSCOPY/RETROGRADE/URETEROSCOPY/STONE EXTRACTION WITH BASKET/INSERTION LEFT DOUBLE  J STENT;  Surgeon: Su Grand, MD;  Location[facility];  Service: Urology;  Laterality: Left;   FINGER MASS EXCISION  2008   FOOT TENDON SURGERY  1985   HOLMIUM LASER APPLICATION Left 03/01/2013   Procedure: HOLMIUM LASER APPLICATION;  Surgeon: Su Grand, MD;  Location[facility];  Service: Urology;  Laterality: Left;   INNER EAR SURGERY     x2   PARTIAL KNEE  ARTHROPLASTY Left 10/04/2017   Procedure: UNICOMPARTMENTAL LEFT  KNEE;  Surgeon: Sheral Apley, MD;  Location: [facility];  Service: Orthopedics;  Laterality: Left;  Adductor Block   ROTATOR CUFF REPAIR     both shoulders done, last one - 2010   TONSILLECTOMY      MEDICATIONS:  acetaminophen (TYLENOL) 500 MG tablet   apixaban (ELIQUIS) 5 MG TABS tablet   Ascorbic Acid (VITA-C PO)   atenolol (TENORMIN) 100 MG tablet   CALCIUM-VITAMIN D PO   fluticasone (FLONASE) 50 MCG/ACT nasal spray   hydroxypropyl methylcellulose (ISOPTO TEARS) 2.5 % ophthalmic solution   levETIRAcetam (KEPPRA) 500 MG tablet   lisinopril-hydrochlorothiazide (PRINZIDE,ZESTORETIC) 20-12.5 MG per tablet   Multiple Vitamin (MULTIVITAMIN WITH MINERALS) TABS   Omega-3 Fatty Acids (FISH OIL) 1200 MG CAPS   omeprazole (PRILOSEC) 20 MG capsule   simvastatin (ZOCOR) 40 MG tablet  No current facility-administered medications for this encounter.   Marcille BlancoKelly M Jessey Huyett, PA-C [facility] Phone [phone] 05/11/2022 8:52 AM

## 2022-05-11 NOTE — Anesthesia Preprocedure Evaluation (Addendum)
Anesthesia Evaluation  Patient identified by MRN, date of birth, ID band Patient awake    Reviewed: Allergy & Precautions, NPO status , Patient's Chart, lab work & pertinent test results  History of Anesthesia Complications (+) PONV and history of anesthetic complications  Airway Mallampati: III  TM Distance: >3 FB Neck ROM: Full    Dental  (+) Partial Lower, Upper Dentures, Edentulous Upper   Pulmonary asthma    Pulmonary exam normal breath sounds clear to auscultation       Cardiovascular hypertension, Pt. on home beta blockers and Pt. on medications + dysrhythmias Atrial Fibrillation  Rhythm:Irregular Rate:Normal  Echo 06/2020  1. Left ventricular ejection fraction, by estimation, is 60 to 65%. The left ventricle has normal function. The left ventricle has no regional wall motion abnormalities. Left ventricular diastolic parameters are indeterminate.   2. Right ventricular systolic function is normal. The right ventricular size is moderately enlarged. There is normal pulmonary artery systolic pressure.   3. Left atrial size was severely dilated.   4. Right atrial size was severely dilated.   5. The mitral valve is normal in structure. No evidence of mitral valve regurgitation. No evidence of mitral stenosis.   6. The aortic valve is calcified. There is mild calcification of the aortic valve. There is mild thickening of the aortic valve. Aortic valve regurgitation is not visualized. Mild aortic valve sclerosis is present, with no evidence of aortic valve stenosis.   7. The inferior vena cava is normal in size with greater than 50% respiratory variability, suggesting right atrial pressure of 3 mmHg.     Neuro/Psych  Headaches, Seizures -, Well Controlled,  PSYCHIATRIC DISORDERS Anxiety Depression    TIA   GI/Hepatic Neg liver ROS,GERD  Medicated and Controlled,,  Endo/Other    Morbid obesity  Renal/GU Renal disease      Musculoskeletal  (+) Arthritis , Osteoarthritis,  Lumbar spine surgery x 4   Abdominal  (+) + obese  Peds  Hematology  (+) Blood dyscrasia, anemia HLD   Anesthesia Other Findings   Reproductive/Obstetrics                             Anesthesia Physical Anesthesia Plan  ASA: 3  Anesthesia Plan: General   Post-op Pain Management: GA combined w/ Regional for post-op pain and Tylenol PO (pre-op)* and Regional block*   Induction: Intravenous  PONV Risk Score and Plan: 4 or greater and Ondansetron, Dexamethasone, Midazolam and Treatment may vary due to age or medical condition  Airway Management Planned: LMA  Additional Equipment: None  Intra-op Plan:   Post-operative Plan: Extubation in OR  Informed Consent: I have reviewed the patients History and Physical, chart, labs and discussed the procedure including the risks, benefits and alternatives for the proposed anesthesia with the patient or authorized representative who has indicated his/her understanding and acceptance.     Dental advisory given  Plan Discussed with: CRNA  Anesthesia Plan Comments: (See PAT note from 4/5)        Anesthesia Quick Evaluation

## 2022-05-12 NOTE — Care Plan (Signed)
Ortho Bundle Case Management Note  Patient Details  Name: Carol Gamble MRN: [id number] Date of Birth: [birth year] spoke with patient and husband on phone. she will discharge to home with his assistance. has rolling walker at home. CPM ordered. OPPT set up with [facility]. discharge instructions discussed by PA and mailed to patient. states that she has no questions at this time. Patient and MD in agreement with plan. Choice offered.                     DME Arranged:  CPM DME Agency:  [employer]  HH Arranged:    HH Agency:     Additional Comments: Please contact me with any questions of if this plan should need to change.  Shauna Hugh,  RN,BSN,MHA,CCM  [facility]opaedic Specialist  [phone] 05/12/2022, 11:49 AM

## 2022-05-18 ENCOUNTER — Encounter (HOSPITAL_COMMUNITY)
Admission: RE | Disposition: A | Discharge status: Home / Self Care | Payer: Self-pay | Source: Ambulatory Visit | Attending: Orthopedic Surgery

## 2022-05-18 ENCOUNTER — Ambulatory Visit (HOSPITAL_BASED_OUTPATIENT_CLINIC_OR_DEPARTMENT_OTHER): Payer: Medicare Other | Admitting: Certified Registered Nurse Anesthetist

## 2022-05-18 ENCOUNTER — Other Ambulatory Visit: Payer: Self-pay

## 2022-05-18 ENCOUNTER — Ambulatory Visit (HOSPITAL_COMMUNITY)
Admission: RE | Admit: 2022-05-18 | Discharge: 2022-05-18 | Disposition: A | Discharge status: Home / Self Care | Payer: Medicare Other | Source: Ambulatory Visit | Attending: Orthopedic Surgery | Admitting: Orthopedic Surgery

## 2022-05-18 ENCOUNTER — Ambulatory Visit (HOSPITAL_COMMUNITY): Payer: Medicare Other | Admitting: Medical

## 2022-05-18 ENCOUNTER — Encounter (HOSPITAL_COMMUNITY): Payer: Self-pay | Admitting: Orthopedic Surgery

## 2022-05-18 ENCOUNTER — Ambulatory Visit (HOSPITAL_COMMUNITY): Payer: Medicare Other

## 2022-05-18 DIAGNOSIS — G8918 Other acute postprocedural pain: Secondary | ICD-10-CM | POA: Diagnosis not present

## 2022-05-18 DIAGNOSIS — I4891 Unspecified atrial fibrillation: Secondary | ICD-10-CM | POA: Diagnosis not present

## 2022-05-18 DIAGNOSIS — R2681 Unsteadiness on feet: Secondary | ICD-10-CM | POA: Diagnosis not present

## 2022-05-18 DIAGNOSIS — J45909 Unspecified asthma, uncomplicated: Secondary | ICD-10-CM

## 2022-05-18 DIAGNOSIS — R569 Unspecified convulsions: Secondary | ICD-10-CM | POA: Diagnosis not present

## 2022-05-18 DIAGNOSIS — M1711 Unilateral primary osteoarthritis, right knee: Secondary | ICD-10-CM | POA: Diagnosis not present

## 2022-05-18 DIAGNOSIS — K219 Gastro-esophageal reflux disease without esophagitis: Secondary | ICD-10-CM | POA: Insufficient documentation

## 2022-05-18 DIAGNOSIS — Z471 Aftercare following joint replacement surgery: Secondary | ICD-10-CM | POA: Diagnosis not present

## 2022-05-18 DIAGNOSIS — Z6839 Body mass index (BMI) 39.0-39.9, adult: Secondary | ICD-10-CM | POA: Insufficient documentation

## 2022-05-18 DIAGNOSIS — Z96651 Presence of right artificial knee joint: Secondary | ICD-10-CM | POA: Diagnosis not present

## 2022-05-18 DIAGNOSIS — I10 Essential (primary) hypertension: Secondary | ICD-10-CM | POA: Insufficient documentation

## 2022-05-18 DIAGNOSIS — D649 Anemia, unspecified: Secondary | ICD-10-CM | POA: Diagnosis not present

## 2022-05-18 HISTORY — PX: TOTAL KNEE ARTHROPLASTY: SHX125

## 2022-05-18 SURGERY — ARTHROPLASTY, KNEE, TOTAL
Anesthesia: General | Site: Knee | Laterality: Right

## 2022-05-18 MED ORDER — AMISULPRIDE (ANTIEMETIC) 5 MG/2ML IV SOLN
INTRAVENOUS | Status: AC
  Filled 2022-05-18: qty 4

## 2022-05-18 MED ORDER — DEXAMETHASONE SODIUM PHOSPHATE 4 MG/ML IJ SOLN
INTRAMUSCULAR | Status: DC | PRN
  Administered 2022-05-18: 5 mg via INTRAVENOUS

## 2022-05-18 MED ORDER — ONDANSETRON HCL 4 MG/2ML IJ SOLN
4.0000 mg | Freq: Four times a day (QID) | INTRAMUSCULAR | Status: DC | PRN
  Administered 2022-05-18: 4 mg via INTRAVENOUS

## 2022-05-18 MED ORDER — CHLORHEXIDINE GLUCONATE 0.12 % MT SOLN
15.0000 mL | Freq: Once | OROMUCOSAL | Status: AC
  Administered 2022-05-18: 15 mL via OROMUCOSAL

## 2022-05-18 MED ORDER — BUPIVACAINE LIPOSOME 1.3 % IJ SUSP
INTRAMUSCULAR | Status: AC
  Filled 2022-05-18: qty 20

## 2022-05-18 MED ORDER — POVIDONE-IODINE 10 % EX SWAB: 2.0000 | Freq: Once | CUTANEOUS | Status: DC

## 2022-05-18 MED ORDER — HYDROMORPHONE HCL 1 MG/ML IJ SOLN
INTRAMUSCULAR | Status: AC
  Administered 2022-05-18: 0.5 mg via INTRAVENOUS
  Filled 2022-05-18: qty 1

## 2022-05-18 MED ORDER — METHOCARBAMOL 500 MG IVPB - SIMPLE MED: 500.0000 mg | Freq: Four times a day (QID) | INTRAVENOUS | Status: DC | PRN

## 2022-05-18 MED ORDER — CEFAZOLIN SODIUM-DEXTROSE 2-4 GM/100ML-% IV SOLN
2.0000 g | INTRAVENOUS | Status: AC
  Administered 2022-05-18: 2 g via INTRAVENOUS
  Filled 2022-05-18: qty 100

## 2022-05-18 MED ORDER — BUPIVACAINE LIPOSOME 1.3 % IJ SUSP: 20.0000 mL | Freq: Once | INTRAMUSCULAR | Status: DC

## 2022-05-18 MED ORDER — WATER FOR IRRIGATION, STERILE IR SOLN
Status: DC | PRN
  Administered 2022-05-18: 2000 mL

## 2022-05-18 MED ORDER — ORAL CARE MOUTH RINSE: 15.0000 mL | Freq: Once | OROMUCOSAL | Status: AC

## 2022-05-18 MED ORDER — HYDROMORPHONE HCL 2 MG/ML IJ SOLN
INTRAMUSCULAR | Status: AC
  Filled 2022-05-18: qty 1

## 2022-05-18 MED ORDER — ONDANSETRON HCL 4 MG/2ML IJ SOLN
INTRAMUSCULAR | Status: AC
  Filled 2022-05-18: qty 2

## 2022-05-18 MED ORDER — MIDAZOLAM HCL 2 MG/2ML IJ SOLN: 1.0000 mg | INTRAMUSCULAR | Status: DC

## 2022-05-18 MED ORDER — FENTANYL CITRATE (PF) 100 MCG/2ML IJ SOLN
INTRAMUSCULAR | Status: DC | PRN
  Administered 2022-05-18: 100 ug via INTRAVENOUS

## 2022-05-18 MED ORDER — BUPIVACAINE-EPINEPHRINE 0.25% -1:200000 IJ SOLN
INTRAMUSCULAR | Status: AC
  Filled 2022-05-18: qty 1

## 2022-05-18 MED ORDER — TRANEXAMIC ACID-NACL 1000-0.7 MG/100ML-% IV SOLN: 1000.0000 mg | Freq: Once | INTRAVENOUS | Status: DC

## 2022-05-18 MED ORDER — ACETAMINOPHEN 500 MG PO TABS
1000.0000 mg | ORAL_TABLET | Freq: Once | ORAL | Status: AC
  Administered 2022-05-18: 1000 mg via ORAL
  Filled 2022-05-18: qty 2

## 2022-05-18 MED ORDER — AMISULPRIDE (ANTIEMETIC) 5 MG/2ML IV SOLN
10.0000 mg | Freq: Once | INTRAVENOUS | Status: AC | PRN
  Administered 2022-05-18: 10 mg via INTRAVENOUS

## 2022-05-18 MED ORDER — METHOCARBAMOL 500 MG PO TABS: 500.0000 mg | ORAL_TABLET | Freq: Four times a day (QID) | ORAL | Status: DC | PRN

## 2022-05-18 MED ORDER — ONDANSETRON HCL 4 MG/2ML IJ SOLN
INTRAMUSCULAR | Status: DC | PRN
  Administered 2022-05-18: 4 mg via INTRAVENOUS

## 2022-05-18 MED ORDER — LIDOCAINE HCL (CARDIAC) PF 100 MG/5ML IV SOSY
PREFILLED_SYRINGE | INTRAVENOUS | Status: DC | PRN
  Administered 2022-05-18: 100 mg via INTRATRACHEAL

## 2022-05-18 MED ORDER — 0.9 % SODIUM CHLORIDE (POUR BTL) OPTIME
TOPICAL | Status: DC | PRN
  Administered 2022-05-18: 1000 mL

## 2022-05-18 MED ORDER — LACTATED RINGERS IV SOLN: INTRAVENOUS | Status: DC

## 2022-05-18 MED ORDER — SODIUM CHLORIDE 0.9 % IR SOLN
Status: DC | PRN
  Administered 2022-05-18: 1000 mL

## 2022-05-18 MED ORDER — FENTANYL CITRATE PF 50 MCG/ML IJ SOSY
50.0000 ug | PREFILLED_SYRINGE | INTRAMUSCULAR | Status: DC
  Administered 2022-05-18: 50 ug via INTRAVENOUS
  Filled 2022-05-18: qty 2

## 2022-05-18 MED ORDER — FENTANYL CITRATE (PF) 100 MCG/2ML IJ SOLN
INTRAMUSCULAR | Status: AC
  Filled 2022-05-18: qty 2

## 2022-05-18 MED ORDER — HYDROMORPHONE HCL 1 MG/ML IJ SOLN
INTRAMUSCULAR | Status: DC | PRN
  Administered 2022-05-18: 1 mg via INTRAVENOUS

## 2022-05-18 MED ORDER — LACTATED RINGERS IV BOLUS
250.0000 mL | Freq: Once | INTRAVENOUS | Status: AC
  Administered 2022-05-18: 250 mL via INTRAVENOUS

## 2022-05-18 MED ORDER — ONDANSETRON HCL 4 MG PO TABS: 4.0000 mg | ORAL_TABLET | Freq: Four times a day (QID) | ORAL | Status: DC | PRN

## 2022-05-18 MED ORDER — CEFAZOLIN SODIUM-DEXTROSE 2-4 GM/100ML-% IV SOLN: 2.0000 g | Freq: Four times a day (QID) | INTRAVENOUS | Status: DC

## 2022-05-18 MED ORDER — TRANEXAMIC ACID-NACL 1000-0.7 MG/100ML-% IV SOLN
1000.0000 mg | INTRAVENOUS | Status: AC
  Administered 2022-05-18: 1000 mg via INTRAVENOUS
  Filled 2022-05-18: qty 100

## 2022-05-18 MED ORDER — SODIUM CHLORIDE (PF) 0.9 % IJ SOLN
INTRAMUSCULAR | Status: AC
  Filled 2022-05-18: qty 30

## 2022-05-18 MED ORDER — PROPOFOL 10 MG/ML IV BOLUS
INTRAVENOUS | Status: DC | PRN
  Administered 2022-05-18: 150 mg via INTRAVENOUS

## 2022-05-18 MED ORDER — SODIUM CHLORIDE (PF) 0.9 % IJ SOLN
INTRAMUSCULAR | Status: DC | PRN
  Administered 2022-05-18: 80 mL

## 2022-05-18 MED ORDER — HYDROMORPHONE HCL 1 MG/ML IJ SOLN
0.2500 mg | INTRAMUSCULAR | Status: DC | PRN
  Administered 2022-05-18: 0.5 mg via INTRAVENOUS

## 2022-05-18 MED ORDER — DEXAMETHASONE SODIUM PHOSPHATE 10 MG/ML IJ SOLN
INTRAMUSCULAR | Status: AC
  Filled 2022-05-18: qty 1

## 2022-05-18 MED ORDER — DEXAMETHASONE SODIUM PHOSPHATE 10 MG/ML IJ SOLN: 8.0000 mg | Freq: Once | INTRAMUSCULAR | Status: DC

## 2022-05-18 MED ORDER — LACTATED RINGERS IV BOLUS
500.0000 mL | Freq: Once | INTRAVENOUS | Status: AC
  Administered 2022-05-18: 500 mL via INTRAVENOUS

## 2022-05-18 SURGICAL SUPPLY — 54 items
BAG COUNTER SPONGE SURGICOUNT (BAG) IMPLANT
BAG SPNG CNTER NS LX DISP (BAG)
BLADE HEX COATED 2.75 (ELECTRODE) ×1 IMPLANT
BLADE SAG 18X100X1.27 (BLADE) ×1 IMPLANT
BLADE SAGITTAL 25.0X1.37X90 (BLADE) ×1 IMPLANT
BLADE SURG 15 STRL LF DISP TIS (BLADE) ×1 IMPLANT
BLADE SURG 15 STRL SS (BLADE) ×1
BLADE SURG SZ10 CARB STEEL (BLADE) IMPLANT
BNDG CMPR MED 10X6 ELC LF (GAUZE/BANDAGES/DRESSINGS) ×1
BNDG ELASTIC 6X10 VLCR STRL LF (GAUZE/BANDAGES/DRESSINGS) ×1 IMPLANT
BOWL SMART MIX CTS (DISPOSABLE) IMPLANT
BSPLAT TIB 4 KN TRITANIUM (Knees) ×1 IMPLANT
CLSR STERI-STRIP ANTIMIC 1/2X4 (GAUZE/BANDAGES/DRESSINGS) ×1 IMPLANT
COMPONENT TRI CR RETAIN KNEE (Orthopedic Implant) IMPLANT
COVER SURGICAL LIGHT HANDLE (MISCELLANEOUS) ×1 IMPLANT
CUFF TOURN SGL QUICK 34 (TOURNIQUET CUFF) ×1
CUFF TRNQT CYL 34X4.125X (TOURNIQUET CUFF) ×1 IMPLANT
DRAPE U-SHAPE 47X51 STRL (DRAPES) ×1 IMPLANT
DRSG MEPILEX POST OP 4X12 (GAUZE/BANDAGES/DRESSINGS) ×1 IMPLANT
DURAPREP 26ML APPLICATOR (WOUND CARE) ×2 IMPLANT
GLOVE BIO SURGEON STRL SZ7.5 (GLOVE) ×2 IMPLANT
GLOVE BIOGEL PI IND STRL 7.5 (GLOVE) ×1 IMPLANT
GLOVE BIOGEL PI IND STRL 8 (GLOVE) ×1 IMPLANT
GLOVE SURG SYN 7.5  E (GLOVE) ×1
GLOVE SURG SYN 7.5 E (GLOVE) ×1 IMPLANT
GLOVE SURG SYN 7.5 PF PI (GLOVE) ×1 IMPLANT
GOWN STRL REUS W/ TWL LRG LVL3 (GOWN DISPOSABLE) ×1 IMPLANT
GOWN STRL REUS W/ TWL XL LVL3 (GOWN DISPOSABLE) ×1 IMPLANT
GOWN STRL REUS W/TWL LRG LVL3 (GOWN DISPOSABLE) ×1
GOWN STRL REUS W/TWL XL LVL3 (GOWN DISPOSABLE) ×1
HANDPIECE INTERPULSE COAX TIP (DISPOSABLE) ×1
HOLDER FOLEY CATH W/STRAP (MISCELLANEOUS) IMPLANT
IMMOBILIZER KNEE 22 UNIV (SOFTGOODS) IMPLANT
INSERT TIB BEARING X3 SZ4 11 (Joint) IMPLANT
KIT TURNOVER KIT A (KITS) IMPLANT
KNEE PATELLA ASYMMETRIC 9X29 (Knees) IMPLANT
KNEE TIBIAL COMP TRI SZ4 (Knees) IMPLANT
MANIFOLD NEPTUNE II (INSTRUMENTS) ×1 IMPLANT
NS IRRIG 1000ML POUR BTL (IV SOLUTION) ×1 IMPLANT
PACK TOTAL KNEE CUSTOM (KITS) ×1 IMPLANT
PIN FLUTED HEDLESS FIX 3.5X1/8 (PIN) IMPLANT
PROTECTOR NERVE ULNAR (MISCELLANEOUS) ×1 IMPLANT
SET HNDPC FAN SPRY TIP SCT (DISPOSABLE) ×1 IMPLANT
SPIKE FLUID TRANSFER (MISCELLANEOUS) ×1 IMPLANT
SUT MNCRL AB 3-0 PS2 18 (SUTURE) ×1 IMPLANT
SUT VIC AB 0 CT1 36 (SUTURE) ×1 IMPLANT
SUT VIC AB 1 CT1 36 (SUTURE) ×2 IMPLANT
SUT VIC AB 2-0 CT1 27 (SUTURE) ×1
SUT VIC AB 2-0 CT1 TAPERPNT 27 (SUTURE) ×1 IMPLANT
TRAY FOLEY MTR SLVR 14FR STAT (SET/KITS/TRAYS/PACK) IMPLANT
TRAY FOLEY MTR SLVR 16FR STAT (SET/KITS/TRAYS/PACK) IMPLANT
TRIA CRUCIATE RETAIN KNEE (Orthopedic Implant) ×1 IMPLANT
TUBE SUCTION HIGH CAP CLEAR NV (SUCTIONS) ×1 IMPLANT
WRAP KNEE MAXI GEL POST OP (GAUZE/BANDAGES/DRESSINGS) ×1 IMPLANT

## 2022-05-18 NOTE — Anesthesia Procedure Notes (Signed)
Procedure Name: LMA Insertion Date/Time: 05/18/2022 9:10 AM  Performed by: Vanessa [place], CRNAPre-anesthesia Checklist: Emergency Drugs available, Patient identified, Suction available and Patient being monitored Patient Re-evaluated:Patient Re-evaluated prior to induction Oxygen Delivery Method: Circle system utilized Preoxygenation: Pre-oxygenation with 100% oxygen Induction Type: IV induction Ventilation: Mask ventilation without difficulty LMA: LMA with gastric port inserted LMA Size: 4.0 Number of attempts: 1 Placement Confirmation: positive ETCO2 and breath sounds checked- equal and bilateral Tube secured with: Tape Dental Injury: Teeth and Oropharynx as per pre-operative assessment

## 2022-05-18 NOTE — Interval H&P Note (Signed)
History and Physical Interval Note:  05/18/2022 6:48 AM  Carol Gamble  has presented today for surgery, with the diagnosis of OA RIGHT KNEE.  The various methods of treatment have been discussed with the patient and family. After consideration of risks, benefits and other options for treatment, the patient has consented to  Procedure(s): TOTAL KNEE ARTHROPLASTY (Right) as a surgical intervention.  The patient's history has been reviewed, patient examined, no change in status, stable for surgery.  I have reviewed the patient's chart and labs.  Questions were answered to the patient's satisfaction.     Sheral Apley

## 2022-05-18 NOTE — Evaluation (Signed)
Physical Therapy Evaluation Patient Details Name: Carol Gamble MRN: [id number] DOB: 1940-07-15 Today's Date: 05/18/2022  History of Present Illness  82 yo female presents to therapy s/p R TKA on 05/18/2022 due to failure of conservative measures. Pt PMH includes but is not limited to: LBP, asthma, HDL, HTN, seizures, B RTC repair and L partial knee replacement (2019).  Clinical Impression    Carol Gamble is a 82 y.o. female POD 0 s/p R TKA. Patient reports mod I with mobility at baseline. Patient is now limited by functional impairments (see PT problem list below) and requires min guard and cues for transfers and gait with RW. Patient was able to ambulate 45, 30 feet with RW and min guard and cues for safe walker management. Dr. Eulah Pont recommendation for R KI to be donned for safety and stability with transfers and gait tasks. Patient educated on safe sequencing for car transfers, R KI, pain management, use of CP and fall risk prevention pt and husband verbalized understanding of safe guarding position for people assisting with mobility. Patient instructed in exercises to facilitate ROM and circulation reviewed and HO provided. Pt had episode of N and V during PT eval. Patient will benefit from continued skilled PT interventions to address impairments and progress towards PLOF. Patient has met mobility goals at adequate level for discharge home with family support and pt stating OPPT 4/18; will continue to follow if pt continues acute stay to progress towards Mod I goals.      Recommendations for follow up therapy are one component of a multi-disciplinary discharge planning process, led by the attending physician.  Recommendations may be updated based on patient status, additional functional criteria and insurance authorization.  Follow Up Recommendations       Assistance Recommended at Discharge Intermittent Supervision/Assistance  Patient can return home with the following  A little help  with walking and/or transfers;A little help with bathing/dressing/bathroom;Assistance with cooking/housework;Assist for transportation;Help with stairs or ramp for entrance    Equipment Recommendations None recommended by PT (pt reports DME in home setting)  Recommendations for Other Services       Functional Status Assessment Patient has had a recent decline in their functional status and demonstrates the ability to make significant improvements in function in a reasonable and predictable amount of time.     Precautions / Restrictions Precautions Precautions: Knee;Fall Restrictions Weight Bearing Restrictions: No      Mobility  Bed Mobility Overal bed mobility: Needs Assistance Bed Mobility: Supine to Sit     Supine to sit: Min guard     General bed mobility comments: cues    Transfers Overall transfer level: Needs assistance Equipment used: Rolling walker (2 wheels) Transfers: Sit to/from Stand Sit to Stand: Min guard           General transfer comment: cues for proper UE and AD placement with R KI donned for bed, recliner and commode transfers    Ambulation/Gait Ambulation/Gait assistance: Min guard Gait Distance (Feet): 45 Feet Assistive device: Rolling walker (2 wheels) Gait Pattern/deviations: Step-to pattern, Antalgic, Trunk flexed Gait velocity: decreased     General Gait Details: L ankle instability noted pt reports having L ankle brace in home setting  Stairs            Wheelchair Mobility    Modified Rankin (Stroke Patients Only)       Balance Overall balance assessment: Needs assistance Sitting-balance support: Single extremity supported Sitting balance-Leahy Scale: Good  Standing balance support: Bilateral upper extremity supported, Reliant on assistive device for balance, During functional activity Standing balance-Leahy Scale: Poor Standing balance comment: static standing with wide BOS and no UE support                              Pertinent Vitals/Pain Pain Assessment Pain Assessment: 0-10 Pain Score: 2  Pain Location: R knee Pain Descriptors / Indicators: Aching, Discomfort, Dull Pain Intervention(s): Limited activity within patient's tolerance, Monitored during session, Premedicated before session, Ice applied    Home Living Family/patient expects to be discharged to:: Private residence Living Arrangements: Spouse/significant other Available Help at Discharge: Family;Available 24 hours/day Type of Home: House Home Access: Level entry;Ramped entrance       Home Layout: One level Home Equipment: Cane - single Librarian, academic (2 wheels)      Prior Function Prior Level of Function : Independent/Modified Independent             Mobility Comments: mod I with SPC with ADLs, self care tasks, IADLs and driving       Hand Dominance        Extremity/Trunk Assessment        Lower Extremity Assessment Lower Extremity Assessment: RLE deficits/detail RLE Deficits / Details: ankle DF/PF 5/5 RLE Sensation: decreased light touch    Cervical / Trunk Assessment Cervical / Trunk Assessment: Normal  Communication   Communication: No difficulties  Cognition Arousal/Alertness: Awake/alert Behavior During Therapy: WFL for tasks assessed/performed Overall Cognitive Status: Within Functional Limits for tasks assessed                                          General Comments      Exercises Total Joint Exercises Ankle Circles/Pumps: AROM, Both, 20 reps Quad Sets: AROM, Right, 5 reps Heel Slides: AROM, Right, 5 reps Hip ABduction/ADduction: AROM, Right, 5 reps Straight Leg Raises: AROM, Right, 5 reps Long Arc Quad: AROM, Right, 5 reps   Assessment/Plan    PT Assessment Patient needs continued PT services  PT Problem List Decreased strength;Decreased range of motion;Decreased activity tolerance;Decreased balance;Decreased mobility;Decreased  coordination;Decreased cognition;Decreased safety awareness;Decreased knowledge of precautions;Pain       PT Treatment Interventions DME instruction;Gait training;Functional mobility training;Therapeutic activities;Therapeutic exercise;Balance training;Neuromuscular re-education;Patient/family education;Modalities    PT Goals (Current goals can be found in the Care Plan section)  Acute Rehab PT Goals Patient Stated Goal: weekend trip, diving and shoping, get back to  household management PT Goal Formulation: With patient Time For Goal Achievement: 06/01/22 Potential to Achieve Goals: Good    Frequency       Co-evaluation               AM-PAC PT "6 Clicks" Mobility  Outcome Measure Help needed turning from your back to your side while in a flat bed without using bedrails?: A Little Help needed moving from lying on your back to sitting on the side of a flat bed without using bedrails?: A Little Help needed moving to and from a bed to a chair (including a wheelchair)?: A Little Help needed standing up from a chair using your arms (e.g., wheelchair or bedside chair)?: A Little Help needed to walk in hospital room?: A Little Help needed climbing 3-5 steps with a railing? : A Lot 6 Click Score: 17    End  of Session Equipment Utilized During Treatment: Gait belt Activity Tolerance: No increased pain (tx prolonged due to episode of N and V, pt provided with medication and recoved to particpate with PT eval) Patient left: in chair;with call bell/phone within reach;with family/visitor present Nurse Communication: Mobility status;Other (comment) (progression toward d/c from PT standpoint) PT Visit Diagnosis: Unsteadiness on feet (R26.81);Other abnormalities of gait and mobility (R26.89);Muscle weakness (generalized) (M62.81);Difficulty in walking, not elsewhere classified (R26.2);Pain Pain - Right/Left: Right Pain - part of body: Leg;Knee    Time: 1610-9604 PT Time Calculation  (min) (ACUTE ONLY): 61 min   Charges:   PT Evaluation $PT Eval Low Complexity: 1 Low PT Treatments $Gait Training: 8-22 mins $Therapeutic Exercise: 8-22 mins $Therapeutic Activity: 8-22 mins        Rica Mote, PT   Jacqualyn Posey 05/18/2022, 5:34 PM

## 2022-05-18 NOTE — Op Note (Signed)
DATE OF SURGERY:  05/18/2022 TIME: 10:23 AM  PATIENT NAME:  Carol Gamble   AGE: 82 y.o.    PRE-OPERATIVE DIAGNOSIS:  OA RIGHT KNEE  POST-OPERATIVE DIAGNOSIS:  Same  PROCEDURE:  Procedure(s): TOTAL KNEE ARTHROPLASTY   SURGEON:  Sheral Apley, MD   ASSISTANT:  Levester Fresh, PA-C, she was present and scrubbed throughout the case, critical for completion in a timely fashion, and for retraction, instrumentation, and closure.    OPERATIVE IMPLANTS: [employer]ker Triathlon CR. Press fit knee  Femur size 3, Tibia size 4, Patella size 29 3-peg oval button, with a 11 mm polyethylene insert.   PREOPERATIVE INDICATIONS:  Carol Gamble is a 82 y.o. year old female with end stage bone on bone degenerative arthritis of the knee who failed conservative treatment, including injections, antiinflammatories, activity modification, and assistive devices, and had significant impairment of their activities of daily living, and elected for Total Knee Arthroplasty.   The risks, benefits, and alternatives were discussed at length including but not limited to the risks of infection, bleeding, nerve injury, stiffness, blood clots, the need for revision surgery, cardiopulmonary complications, among others, and they were willing to proceed.   OPERATIVE DESCRIPTION:  The patient was brought to the operative room and placed in a supine position.  General anesthesia was administered.  IV antibiotics were given.  The lower extremity was prepped and draped in the usual sterile fashion.  Time out was performed.  The leg was elevated and exsanguinated and the tourniquet was inflated.  Anterior approach was performed.  The patella was everted and osteophytes were removed.  The anterior horn of the medial and lateral meniscus was removed.   The distal femur was opened with the drill and the intramedullary distal femoral cutting jig was utilized, set at 5 degrees resecting 8 mm off the distal femur.  Care was taken  to protect the collateral ligaments.  The distal femoral sizing jig was applied, taking care to avoid notching.  Then the 4-in-1 cutting jig was applied and the anterior and posterior femur was cut, along with the chamfer cuts.  All posterior osteophytes were removed.  The flexion gap was then measured and was symmetric with the extension gap.  Then the extramedullary tibial cutting jig was utilized making the appropriate cut using the anterior tibial crest as a reference building in appropriate posterior slope.  Care was taken during the cut to protect the medial and collateral ligaments.  The proximal tibia was removed along with the posterior horns of the menisci.    I completed the distal femoral preparation using the appropriate jig to prepare the box.  The patella was then measured, and cut with the saw.    The proximal tibia sized and prepared accordingly with the reamer and the punch, and then all components were trialed with the above sized poly insert.  The knee was found to have excellent balance and full motion.    The above named components were then impacted into place and Poly tibial piece and patella were inserted.  I was very happy with his stability and ROM  I performed a periarticular injection with Exparel  The knee was easily taken through a range of motion and the patella tracked well and the knee irrigated copiously and the parapatellar and subcutaneous tissue closed with vicryl, and monocryl with steri strips for the skin.  The incision was dressed with sterile gauze and the tourniquet released and the patient was awakened and returned to the  PACU in stable and satisfactory condition.  There were no complications.  Total tourniquet time was roughly 75 minutes.   POSTOPERATIVE PLAN: post op Abx, DVT px: SCD's, TED's, Early ambulation and chemical px

## 2022-05-18 NOTE — Progress Notes (Signed)
Orthopedic Tech Progress Note Patient Details:  Carol Gamble [birth year] [id number]  Patient ID: [name], female   DOB: [birth year], 82 y.o.   MRN: [id number]  Kizzie Fantasia 05/18/2022, 11:28 AM Right bone foam applied in pacu

## 2022-05-18 NOTE — Discharge Instructions (Addendum)

## 2022-05-18 NOTE — Transfer of Care (Signed)
Immediate Anesthesia Transfer of Care Note  Patient: Carol Gamble  Procedure(s) Performed: TOTAL KNEE ARTHROPLASTY (Right: Knee)  Patient Location: PACU  Anesthesia Type:General  Level of Consciousness: awake and patient cooperative  Airway & Oxygen Therapy: Patient Spontanous Breathing and Patient connected to face mask  Post-op Assessment: Report given to RN and Post -op Vital signs reviewed and stable  Post vital signs: Reviewed and stable  Last Vitals:  Vitals Value Taken Time  BP 149/80 05/18/22 1106  Temp    Pulse 85 05/18/22 1107  Resp 11 05/18/22 1107  SpO2 96 % 05/18/22 1107  Vitals shown include unvalidated device data.  Last Pain:  Vitals:   05/18/22 0708  TempSrc: Oral  PainSc:       Patients Stated Pain Goal: 4 (05/18/22 1610)  Complications: No notable events documented.

## 2022-05-18 NOTE — Anesthesia Procedure Notes (Signed)
Anesthesia Regional Block: Adductor canal block   Pre-Anesthetic Checklist: , timeout performed,  Correct Patient, Correct Site, Correct Laterality,  Correct Procedure, Correct Position, site marked,  Risks and benefits discussed,  Surgical consent,  Pre-op evaluation,  At surgeon's request and post-op pain management  Laterality: Lower and Right  Prep: chloraprep       Needles:  Injection technique: Single-shot  Needle Type: Stimiplex     Needle Length: 9cm  Needle Gauge: 21     Additional Needles:   Procedures:,,,, ultrasound used (permanent image in chart),,    Narrative:  Injection made incrementally with aspirations every 5 mL.  Performed by: Personally  Anesthesiologist: Lewie Loron, MD  Additional Notes: BP cuff, EKG monitors applied. Sedation begun. Artery and nerve location verified with ultrasound. Anesthetic injected incrementally (5ml), slowly, and after negative aspirations under direct u/s guidance. Good fascial/perineural spread. Tolerated well.

## 2022-05-18 NOTE — Interval H&P Note (Signed)
History and Physical Interval Note:  05/18/2022 7:00 AM  Carol Gamble  has presented today for surgery, with the diagnosis of OA RIGHT KNEE.  The various methods of treatment have been discussed with the patient and family. After consideration of risks, benefits and other options for treatment, the patient has consented to  Procedure(s): TOTAL KNEE ARTHROPLASTY (Right) as a surgical intervention.  The patient's history has been reviewed, patient examined, no change in status, stable for surgery.  I have reviewed the patient's chart and labs.  Questions were answered to the patient's satisfaction.     Sheral Apley

## 2022-05-19 ENCOUNTER — Encounter (HOSPITAL_COMMUNITY): Payer: Self-pay | Admitting: Orthopedic Surgery

## 2022-05-19 NOTE — Anesthesia Postprocedure Evaluation (Signed)
Anesthesia Post Note  Patient: Carol Gamble  Procedure(s) Performed: TOTAL KNEE ARTHROPLASTY (Right: Knee)     Patient location during evaluation: PACU Anesthesia Type: General Level of consciousness: sedated and patient cooperative Pain management: pain level controlled Vital Signs Assessment: post-procedure vital signs reviewed and stable Respiratory status: spontaneous breathing Cardiovascular status: stable Anesthetic complications: no   No notable events documented.  Last Vitals:  Vitals:   05/18/22 1445 05/18/22 1515  BP: 106/71 122/86  Pulse: 78   Resp:    Temp:    SpO2: 96%     Last Pain:  Vitals:   05/18/22 1437  TempSrc:   PainSc: 0-No pain                 Lewie Loron

## 2022-05-21 DIAGNOSIS — M1711 Unilateral primary osteoarthritis, right knee: Secondary | ICD-10-CM | POA: Diagnosis not present

## 2022-05-21 DIAGNOSIS — M6281 Muscle weakness (generalized): Secondary | ICD-10-CM | POA: Diagnosis not present

## 2022-05-21 DIAGNOSIS — M25661 Stiffness of right knee, not elsewhere classified: Secondary | ICD-10-CM | POA: Diagnosis not present

## 2022-05-21 DIAGNOSIS — R262 Difficulty in walking, not elsewhere classified: Secondary | ICD-10-CM | POA: Diagnosis not present

## 2022-05-24 DIAGNOSIS — M1711 Unilateral primary osteoarthritis, right knee: Secondary | ICD-10-CM | POA: Diagnosis not present

## 2022-05-24 DIAGNOSIS — M25661 Stiffness of right knee, not elsewhere classified: Secondary | ICD-10-CM | POA: Diagnosis not present

## 2022-05-24 DIAGNOSIS — M6281 Muscle weakness (generalized): Secondary | ICD-10-CM | POA: Diagnosis not present

## 2022-05-24 DIAGNOSIS — R262 Difficulty in walking, not elsewhere classified: Secondary | ICD-10-CM | POA: Diagnosis not present

## 2022-05-26 DIAGNOSIS — R262 Difficulty in walking, not elsewhere classified: Secondary | ICD-10-CM | POA: Diagnosis not present

## 2022-05-26 DIAGNOSIS — M6281 Muscle weakness (generalized): Secondary | ICD-10-CM | POA: Diagnosis not present

## 2022-05-26 DIAGNOSIS — M25661 Stiffness of right knee, not elsewhere classified: Secondary | ICD-10-CM | POA: Diagnosis not present

## 2022-05-26 DIAGNOSIS — M1711 Unilateral primary osteoarthritis, right knee: Secondary | ICD-10-CM | POA: Diagnosis not present

## 2022-05-31 DIAGNOSIS — R262 Difficulty in walking, not elsewhere classified: Secondary | ICD-10-CM | POA: Diagnosis not present

## 2022-05-31 DIAGNOSIS — M25661 Stiffness of right knee, not elsewhere classified: Secondary | ICD-10-CM | POA: Diagnosis not present

## 2022-05-31 DIAGNOSIS — M6281 Muscle weakness (generalized): Secondary | ICD-10-CM | POA: Diagnosis not present

## 2022-05-31 DIAGNOSIS — M1711 Unilateral primary osteoarthritis, right knee: Secondary | ICD-10-CM | POA: Diagnosis not present

## 2022-06-02 DIAGNOSIS — M25661 Stiffness of right knee, not elsewhere classified: Secondary | ICD-10-CM | POA: Diagnosis not present

## 2022-06-02 DIAGNOSIS — R262 Difficulty in walking, not elsewhere classified: Secondary | ICD-10-CM | POA: Diagnosis not present

## 2022-06-02 DIAGNOSIS — M6281 Muscle weakness (generalized): Secondary | ICD-10-CM | POA: Diagnosis not present

## 2022-06-02 DIAGNOSIS — M1711 Unilateral primary osteoarthritis, right knee: Secondary | ICD-10-CM | POA: Diagnosis not present

## 2022-06-05 IMAGING — MG MM DIGITAL SCREENING BILAT W/ TOMO AND CAD
8 series · 8 of 24 positions shown · non-contrast
Comparison: Previous exam(s).

CLINICAL DATA: Screening.

EXAM:
DIGITAL SCREENING BILATERAL MAMMOGRAM WITH TOMOSYNTHESIS AND CAD
TECHNIQUE: Bilateral screening digital craniocaudal and mediolateral oblique
mammograms were obtained. Bilateral screening digital breast
tomosynthesis was performed. The images were evaluated with
computer-aided detection.

[R CC synth-2D]
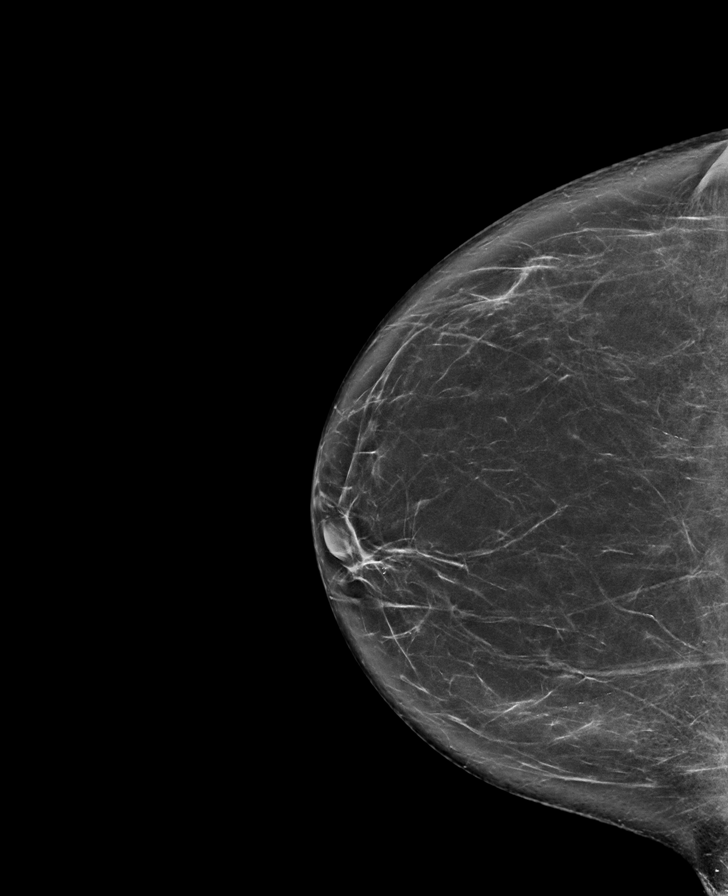

[R MLO synth-2D]
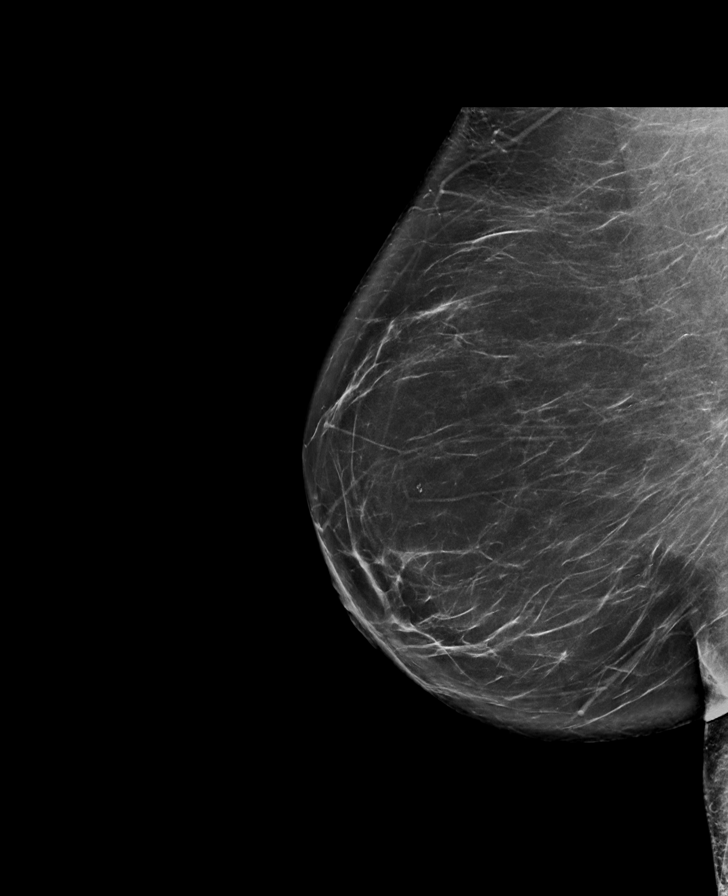

[L CC synth-2D]
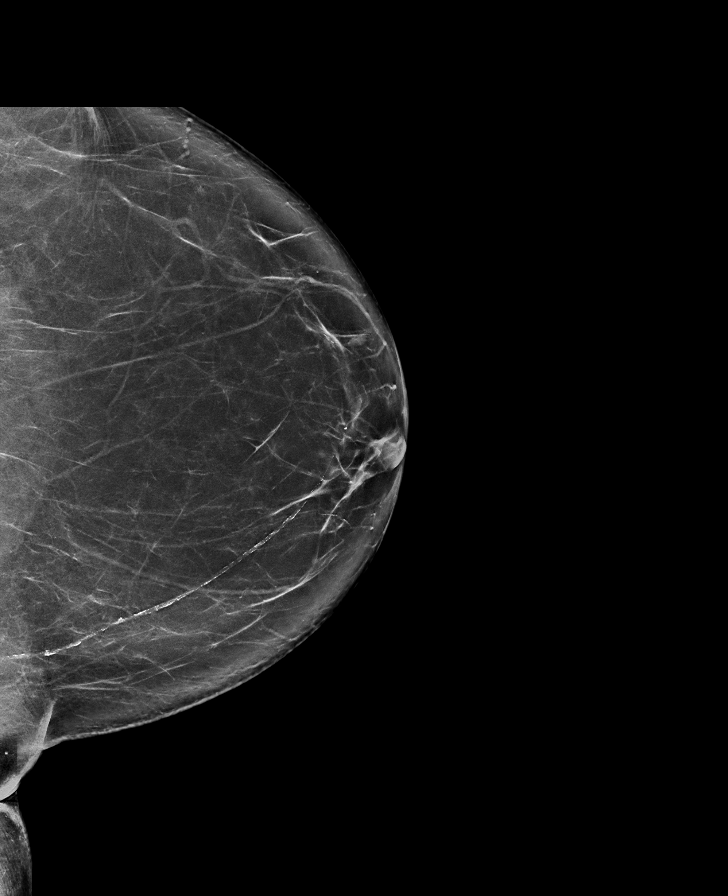

[L MLO synth-2D]
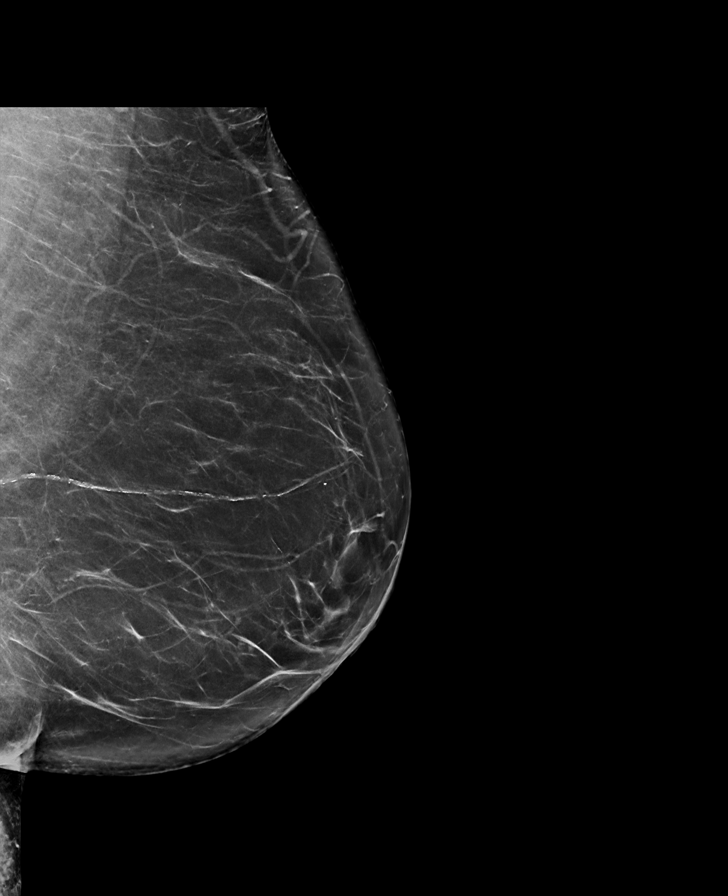

[L MLO tomo · tomo slice 43/85.0]
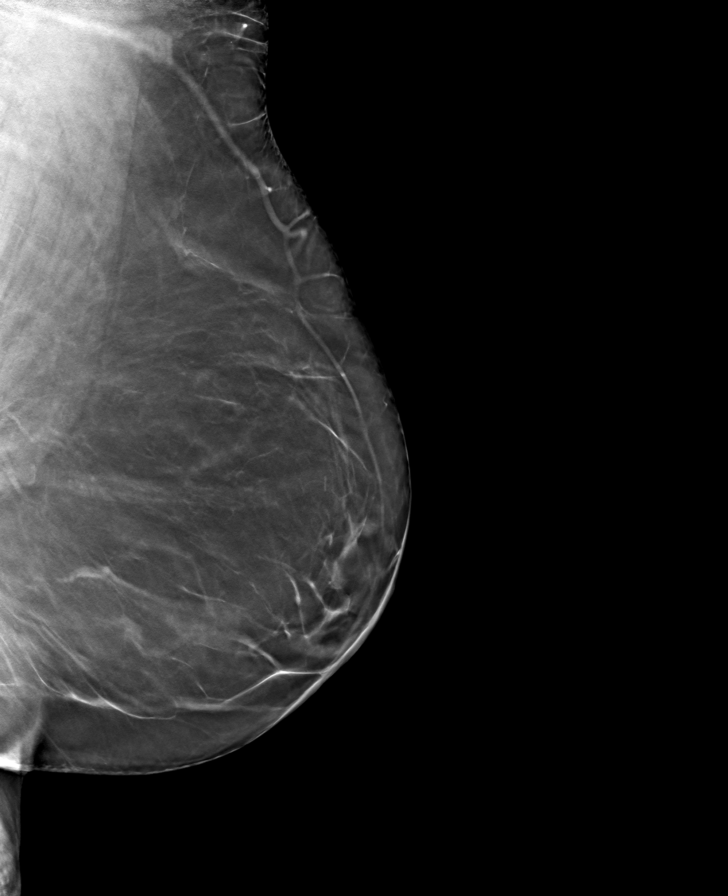

[R MLO tomo · tomo slice 45/88.0]
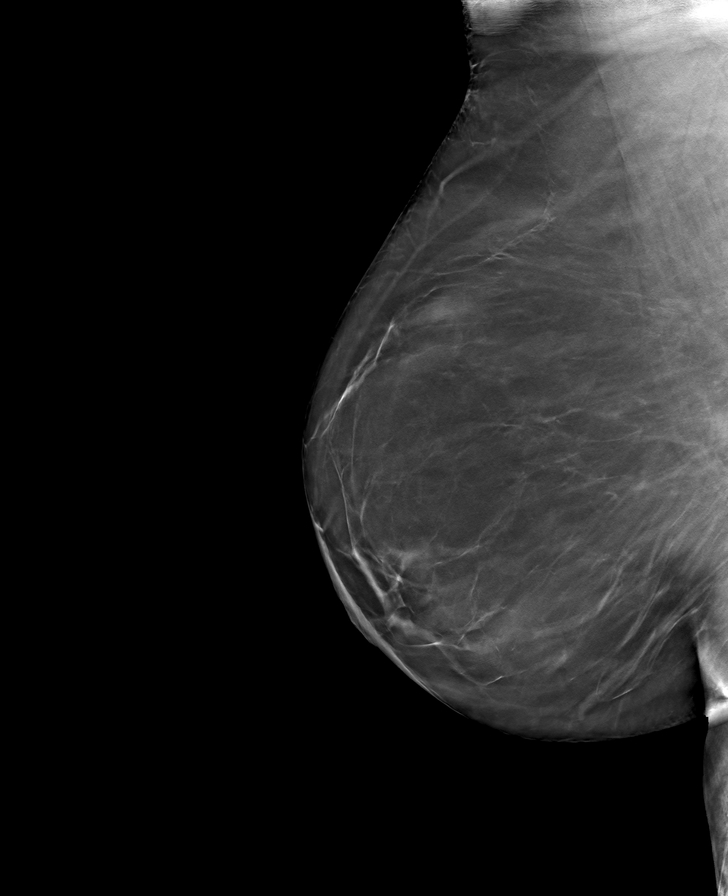

[R CC tomo · tomo slice 41/81.0]
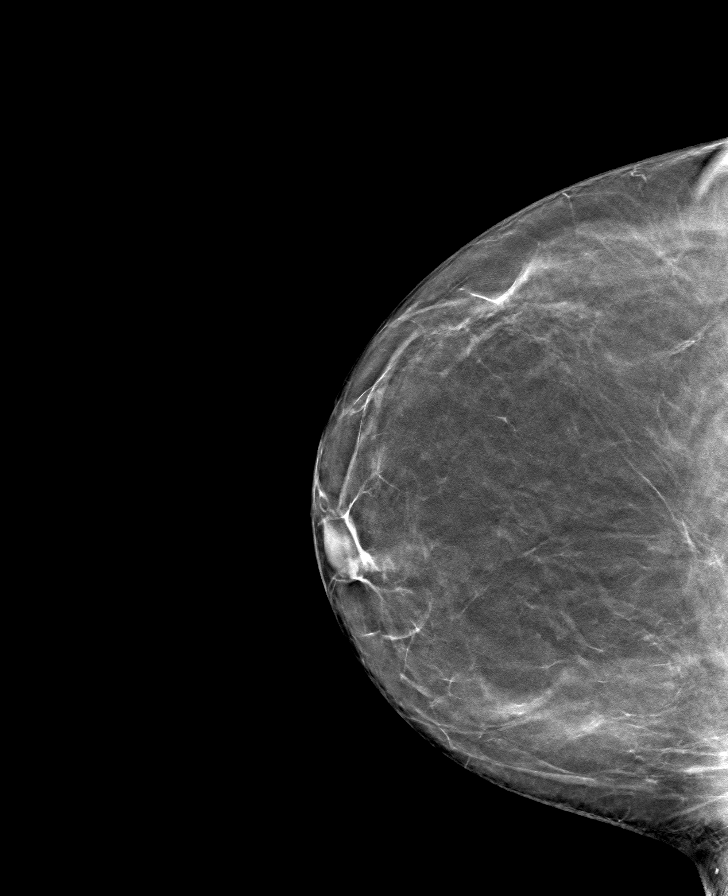

[L CC tomo · tomo slice 43/85.0]
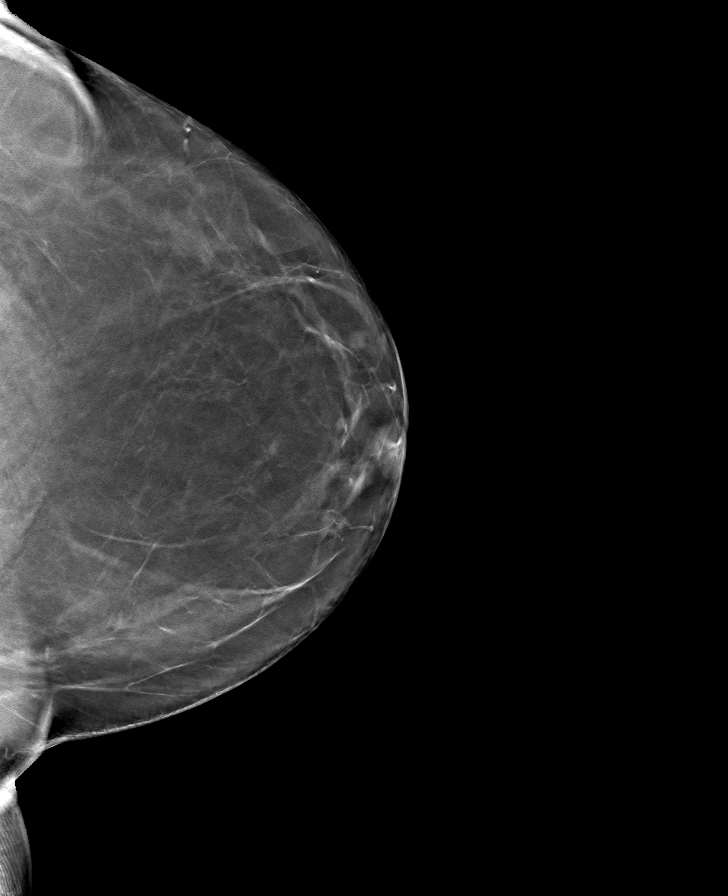

[8 of 24 positions shown; findings below may reference images not displayed]

ACR Breast Density Category b: There are scattered areas of
fibroglandular density.
FINDINGS: There are no findings suspicious for malignancy.
IMPRESSION: No mammographic evidence of malignancy. A result letter of this
screening mammogram will be mailed directly to the patient.

RECOMMENDATION:
Screening mammogram in one year. (Code:51-O-LD2)

BI-RADS CATEGORY  1: Negative.

## 2022-06-07 DIAGNOSIS — M1711 Unilateral primary osteoarthritis, right knee: Secondary | ICD-10-CM | POA: Diagnosis not present

## 2022-06-08 DIAGNOSIS — M6281 Muscle weakness (generalized): Secondary | ICD-10-CM | POA: Diagnosis not present

## 2022-06-08 DIAGNOSIS — M1711 Unilateral primary osteoarthritis, right knee: Secondary | ICD-10-CM | POA: Diagnosis not present

## 2022-06-08 DIAGNOSIS — R262 Difficulty in walking, not elsewhere classified: Secondary | ICD-10-CM | POA: Diagnosis not present

## 2022-06-08 DIAGNOSIS — M25661 Stiffness of right knee, not elsewhere classified: Secondary | ICD-10-CM | POA: Diagnosis not present

## 2022-06-10 DIAGNOSIS — M25661 Stiffness of right knee, not elsewhere classified: Secondary | ICD-10-CM | POA: Diagnosis not present

## 2022-06-10 DIAGNOSIS — M1711 Unilateral primary osteoarthritis, right knee: Secondary | ICD-10-CM | POA: Diagnosis not present

## 2022-06-10 DIAGNOSIS — M6281 Muscle weakness (generalized): Secondary | ICD-10-CM | POA: Diagnosis not present

## 2022-06-10 DIAGNOSIS — R262 Difficulty in walking, not elsewhere classified: Secondary | ICD-10-CM | POA: Diagnosis not present

## 2022-06-14 DIAGNOSIS — R262 Difficulty in walking, not elsewhere classified: Secondary | ICD-10-CM | POA: Diagnosis not present

## 2022-06-14 DIAGNOSIS — M25661 Stiffness of right knee, not elsewhere classified: Secondary | ICD-10-CM | POA: Diagnosis not present

## 2022-06-14 DIAGNOSIS — M1711 Unilateral primary osteoarthritis, right knee: Secondary | ICD-10-CM | POA: Diagnosis not present

## 2022-06-14 DIAGNOSIS — M6281 Muscle weakness (generalized): Secondary | ICD-10-CM | POA: Diagnosis not present

## 2022-06-16 DIAGNOSIS — M1711 Unilateral primary osteoarthritis, right knee: Secondary | ICD-10-CM | POA: Diagnosis not present

## 2022-06-16 DIAGNOSIS — R262 Difficulty in walking, not elsewhere classified: Secondary | ICD-10-CM | POA: Diagnosis not present

## 2022-06-16 DIAGNOSIS — M6281 Muscle weakness (generalized): Secondary | ICD-10-CM | POA: Diagnosis not present

## 2022-06-16 DIAGNOSIS — M25661 Stiffness of right knee, not elsewhere classified: Secondary | ICD-10-CM | POA: Diagnosis not present

## 2022-06-17 ENCOUNTER — Other Ambulatory Visit: Payer: Self-pay | Admitting: Adult Health

## 2022-06-18 ENCOUNTER — Ambulatory Visit: Payer: Medicare Other | Attending: Internal Medicine | Admitting: Internal Medicine

## 2022-06-18 ENCOUNTER — Encounter: Payer: Self-pay | Admitting: Internal Medicine

## 2022-06-18 VITALS — BP 118/68 | HR 75 | Ht 62.0 in | Wt 216.0 lb

## 2022-06-18 DIAGNOSIS — I700 Atherosclerosis of aorta: Secondary | ICD-10-CM | POA: Diagnosis not present

## 2022-06-18 DIAGNOSIS — I10 Essential (primary) hypertension: Secondary | ICD-10-CM | POA: Diagnosis not present

## 2022-06-18 DIAGNOSIS — G459 Transient cerebral ischemic attack, unspecified: Secondary | ICD-10-CM | POA: Diagnosis not present

## 2022-06-18 DIAGNOSIS — D6859 Other primary thrombophilia: Secondary | ICD-10-CM | POA: Diagnosis not present

## 2022-06-18 DIAGNOSIS — I4821 Permanent atrial fibrillation: Secondary | ICD-10-CM | POA: Diagnosis not present

## 2022-06-18 MED ORDER — SIMVASTATIN 80 MG PO TABS: 80.0000 mg | ORAL_TABLET | Freq: Every day | ORAL | 3 refills | Status: DC

## 2022-06-18 MED ORDER — DILTIAZEM HCL ER COATED BEADS 120 MG PO CP24: 120.0000 mg | ORAL_CAPSULE | Freq: Every day | ORAL | 6 refills | Status: DC

## 2022-06-18 NOTE — Progress Notes (Signed)
Cardiology Office Note:    Date:  06/18/2022   ID:  Carol Gamble, Carol Gamble May 28, 1940, MRN [id number]  PCP:  Blair Heys, MD  Cardiologist:  Christell Constant, MD   Referring MD: Blair Heys, MD   CC:  Transition to new cardiologist  History of Present Illness:    Carol Gamble is a 82 y.o. female with a hx of persistent AF, HTN, morbid obesity and anticoagulation therapy.  2023: Rate control planned with Dr. Katrinka Blazing  Patient notes that she is doing well after knee surgery but PT is hard. She is still feel hard palpitations.  Largely at night  No chest pain or pressure .  No SOB/DOE and no PND/Orthopnea.  No weight gain. Isolated swelling in her right leg post surgery.   Past Medical History:  Diagnosis Date   A-fib (HCC)    Anemia    Anxiety    Arthritis    HNP- low back    Asthma    Child   Complication of anesthesia    pt. reports that when she wakes up she had a hard time catching her breath & the PACU team told her that her BP & pulse were not doing what it was suppose to do   Depression    Dyslipidemia 02/27/2016   GERD (gastroesophageal reflux disease)    Headache(784.0)    History of asthma    childhood, age 39   History of kidney stones about 2008ish   HNP (herniated nucleus pulposus), lumbar 06/30/2012   Hypertension    states she always has palpitations    PONV (postoperative nausea and vomiting)    Seizure (HCC)    Seizures (HCC) 02/2016   Stroke-like symptoms 02/27/2016    Past Surgical History:  Procedure Laterality Date   ABDOMINAL HYSTERECTOMY     1993   BACK SURGERY     2000,01,10   CYSTOSCOPY/RETROGRADE/URETEROSCOPY/STONE EXTRACTION WITH BASKET Left 03/01/2013   Procedure: CYSTOSCOPY/RETROGRADE/URETEROSCOPY/STONE EXTRACTION WITH BASKET/INSERTION LEFT DOUBLE  J STENT;  Surgeon: Su Grand, MD;  Location: [facility];  Service: Urology;  Laterality: Left;   FINGER MASS EXCISION  2008   FOOT TENDON SURGERY  1985   HOLMIUM LASER  APPLICATION Left 03/01/2013   Procedure: HOLMIUM LASER APPLICATION;  Surgeon: Su Grand, MD;  Location: [facility];  Service: Urology;  Laterality: Left;   INNER EAR SURGERY     x2   PARTIAL KNEE ARTHROPLASTY Left 10/04/2017   Procedure: UNICOMPARTMENTAL LEFT  KNEE;  Surgeon: Sheral Apley, MD;  Location: [facility];  Service: Orthopedics;  Laterality: Left;  Adductor Block   ROTATOR CUFF REPAIR     both shoulders done, last one - 2010   TONSILLECTOMY     TOTAL KNEE ARTHROPLASTY Right 05/18/2022   Procedure: TOTAL KNEE ARTHROPLASTY;  Surgeon: Sheral Apley, MD;  Location: [facility];  Service: Orthopedics;  Laterality: Right;    Current Medications: Current Meds  Medication Sig   acetaminophen (TYLENOL) 500 MG tablet Take 1,000 mg by mouth every 6 (six) hours as needed for moderate pain.   apixaban (ELIQUIS) 5 MG TABS tablet Take 5 mg by mouth 2 (two) times daily.   Ascorbic Acid (VITA-C PO) Take 1 tablet by mouth daily.   atenolol (TENORMIN) 100 MG tablet TAKE 1 TABLET BY MOUTH DAILY   CALCIUM-VITAMIN D PO Take 1 tablet by mouth 2 (two) times daily.   diltiazem (CARDIZEM CD) 120 MG 24 hr capsule Take 1 capsule (120 mg total) by mouth  daily.   diphenhydramine-acetaminophen (TYLENOL PM EXTRA STRENGTH) 25-500 MG TABS tablet as needed (sleep).   fluticasone (FLONASE) 50 MCG/ACT nasal spray Place 2 sprays into both nostrils daily.   hydroxypropyl methylcellulose (ISOPTO TEARS) 2.5 % ophthalmic solution Place 2 drops into both eyes 3 (three) times daily as needed for dry eyes.    levETIRAcetam (KEPPRA) 500 MG tablet TAKE 1 TABLET BY MOUTH TWICE  DAILY   lisinopril-hydrochlorothiazide (PRINZIDE,ZESTORETIC) 20-12.5 MG per tablet Take 1 tablet by mouth daily with breakfast.   Multiple Vitamin (MULTIVITAMIN WITH MINERALS) TABS Take 1 tablet by mouth daily.   Omega-3 Fatty Acids (FISH OIL) 1200 MG CAPS Take 1,200 mg by mouth 2 (two) times daily.   omeprazole (PRILOSEC) 20 MG capsule Take 20 mg by mouth  daily.   simvastatin (ZOCOR) 80 MG tablet Take 1 tablet (80 mg total) by mouth daily.   [DISCONTINUED] simvastatin (ZOCOR) 40 MG tablet Take 40 mg by mouth at bedtime.     Allergies:   Shrimp [shellfish allergy]   Social History   Socioeconomic History   Marital status: Married    Spouse name: [name]   Number of children: 3   Years of education: 9.5   Highest education level: Not on file  Occupational History    Comment: retired  Tobacco Use   Smoking status: Never   Smokeless tobacco: Never  Vaping Use   Vaping Use: Never used  Substance and Sexual Activity   Alcohol use: No   Drug use: No   Sexual activity: Not on file  Other Topics Concern   Not on file  Social History Narrative   Lives with husband   Caffeine- coffee, 2-3 cups daily   Social Determinants of Health   Financial Resource Strain: Not on file  Food Insecurity: Not on file  Transportation Needs: Not on file  Physical Activity: Not on file  Stress: Not on file  Social Connections: Not on file     Family History: The patient's family history includes Alzheimer's disease in her mother; Aneurysm in her brother.  ROS:   Please see the history of present illness.    She and her husband in the donut hole and asked for Eliquis.  All other systems reviewed and are negative.  EKGs/Labs/Other Studies Reviewed:    The following studies were reviewed today:  Cardiac Studies & Procedures       ECHOCARDIOGRAM  ECHOCARDIOGRAM COMPLETE 06/09/2020  Narrative ECHOCARDIOGRAM REPORT    Patient Name:   Carol Gamble Date of Exam: 06/09/2020 Medical Rec #:  [id number]        Height:       62.0 in Accession #:    [id number]       Weight:       225.0 lb Date of Birth:  16-Feb-1940        BSA:          2.010 m Patient Age:    80 years         BP:           134/84 mmHg Patient Gender: F                HR:           87 bpm. Exam Location:  [address]  Procedure: 2D Echo, 3D Echo, Cardiac Doppler and Color  Doppler  Indications:    I48.91 Atrial Fibrillation  History:        Patient has prior history of Echocardiogram  examinations, most recent 06/08/2017. Abnormal ECG, TIA, Arrythmias:Atrial Fibrillation, Signs/Symptoms:Shortness of Breath; Risk Factors:Former Smoker, Dyslipidemia and Hypertension. COVID 19 Virus (september 2020, with residual SOB and Palpitations), Obesity.  Sonographer:    Farrel Conners RDCS Referring Phys: [phone] [facility]  IMPRESSIONS   1. Left ventricular ejection fraction, by estimation, is 60 to 65%. The left ventricle has normal function. The left ventricle has no regional wall motion abnormalities. Left ventricular diastolic parameters are indeterminate. 2. Right ventricular systolic function is normal. The right ventricular size is moderately enlarged. There is normal pulmonary artery systolic pressure. 3. Left atrial size was severely dilated. 4. Right atrial size was severely dilated. 5. The mitral valve is normal in structure. No evidence of mitral valve regurgitation. No evidence of mitral stenosis. 6. The aortic valve is calcified. There is mild calcification of the aortic valve. There is mild thickening of the aortic valve. Aortic valve regurgitation is not visualized. Mild aortic valve sclerosis is present, with no evidence of aortic valve stenosis. 7. The inferior vena cava is normal in size with greater than 50% respiratory variability, suggesting right atrial pressure of 3 mmHg.  FINDINGS Left Ventricle: Left ventricular ejection fraction, by estimation, is 60 to 65%. The left ventricle has normal function. The left ventricle has no regional wall motion abnormalities. The left ventricular internal cavity size was normal in size. There is no left ventricular hypertrophy. Left ventricular diastolic parameters are indeterminate.  Right Ventricle: The right ventricular size is moderately enlarged. No increase in right ventricular wall thickness. Right  ventricular systolic function is normal. There is normal pulmonary artery systolic pressure. The tricuspid regurgitant velocity is 2.18 m/s, and with an assumed right atrial pressure of 3 mmHg, the estimated right ventricular systolic pressure is 22.0 mmHg.  Left Atrium: Left atrial size was severely dilated.  Right Atrium: Right atrial size was severely dilated.  Pericardium: There is no evidence of pericardial effusion.  Mitral Valve: The mitral valve is normal in structure. No evidence of mitral valve regurgitation. No evidence of mitral valve stenosis.  Tricuspid Valve: The tricuspid valve is normal in structure. Tricuspid valve regurgitation is not demonstrated. No evidence of tricuspid stenosis.  Aortic Valve: The aortic valve is calcified. There is mild calcification of the aortic valve. There is mild thickening of the aortic valve. Aortic valve regurgitation is not visualized. Mild aortic valve sclerosis is present, with no evidence of aortic valve stenosis.  Pulmonic Valve: The pulmonic valve was normal in structure. Pulmonic valve regurgitation is not visualized. No evidence of pulmonic stenosis.  Aorta: The aortic root is normal in size and structure.  Venous: The inferior vena cava is normal in size with greater than 50% respiratory variability, suggesting right atrial pressure of 3 mmHg.  IAS/Shunts: No atrial level shunt detected by color flow Doppler.   LEFT VENTRICLE PLAX 2D LVIDd:         3.50 cm  Diastology LVIDs:         1.90 cm  LV e' medial:    8.05 cm/s LV PW:         1.00 cm  LV E/e' medial:  11.7 LV IVS:        0.80 cm  LV e' lateral:   13.50 cm/s LVOT diam:     2.40 cm  LV E/e' lateral: 7.0 LV SV:         60 LV SV Index:   30 LVOT Area:     4.52 cm   RIGHT  VENTRICLE RV S prime:     10.10 cm/s TAPSE (M-mode): 1.6 cm  LEFT ATRIUM              Index       RIGHT ATRIUM           Index LA diam:        4.60 cm  2.29 cm/m  RA Area:     29.50 cm LA Vol  (A2C):   143.0 ml 71.15 ml/m RA Volume:   106.00 ml 52.74 ml/m LA Vol (A4C):   111.0 ml 55.23 ml/m LA Biplane Vol: 130.0 ml 64.68 ml/m AORTIC VALVE LVOT Vmax:   68.50 cm/s LVOT Vmean:  41.700 cm/s LVOT VTI:    0.133 m  AORTA Ao Root diam: 2.80 cm Ao Asc diam:  3.00 cm  MITRAL VALVE               TRICUSPID VALVE MV Area (PHT): cm         TR Peak grad:   19.0 mmHg MV Decel Time: 160 msec    TR Vmax:        218.00 cm/s MV E velocity: 93.90 cm/s SHUNTS Systemic VTI:  0.13 m Systemic Diam: 2.40 cm  Donato Schultz MD Electronically signed by Donato Schultz MD Signature Date/Time: 06/09/2020/3:45:49 PM    Final    MONITORS  LONG TERM MONITOR (3-14 DAYS) 03/02/2019  Narrative  Continuous course atrial fibrillation throughout duration of this monitor.  Heart rate ranges between 50 and 150 bpm. Average heart rate 88 bpm.            EKG:   06/18/22: Atrial fibrillation 75  Recent Labs: 05/07/2022: BUN 16; Creatinine, Ser 0.94; Hemoglobin 15.3; Platelets 227; Potassium 4.6; Sodium 141  Recent Lipid Panel    Component Value Date/Time   CHOL 180 02/28/2016 0749   TRIG 105 02/28/2016 0749   HDL 62 02/28/2016 0749   CHOLHDL 2.9 02/28/2016 0749   VLDL 21 02/28/2016 0749   LDLCALC 97 02/28/2016 0749    Physical Exam:    VS:  BP 118/68   Pulse 75   Ht 5\' 2"  (1.575 m)   Wt 216 lb (98 kg)   SpO2 97%   BMI 39.51 kg/m     Wt Readings from Last 3 Encounters:  06/18/22 216 lb (98 kg)  05/18/22 214 lb 9.6 oz (97.3 kg)  05/07/22 214 lb 9.6 oz (97.3 kg)     GEN: Morbidly obese. No acute distress HEENT: Normal NECK: No JVD. CARDIAC: No murmur. IRIR VASCULAR:  Normal Pulses. RESPIRATORY:  Clear to auscultation without rales, wheezing or rhonchi  ABDOMEN: Soft, non-tender, non-distended,  MUSCULOSKELETAL: No deformity  SKIN: Warm and dry, scar from knee surgery is healing, +1 R leg swelling NEUROLOGIC:  Alert and oriented x 3 PSYCHIATRIC:  Normal affect   ASSESSMENT:     1. Permanent atrial fibrillation (HCC)   2. Primary hypertension   3. TIA (transient ischemic attack)   4. Hypercoagulable state (HCC)   5. Morbid obesity (HCC)   6. Aortic atherosclerosis (HCC)     PLAN:    Permanent AF  HTN, TIA hx - CHADVASC 5; Eliquis continue - continue atenolol for now - will add diltiazem 120 mg PO daily; she will check BP, if low BP will stop combo pill and just keep HCTZ - if still sx would need AVJ ablation  HLD Aortic atherosclerosis  Morbid obesity - showed CT scan to patient - LDL goal <  70; increase simvastatin and labs in three months  One year me or APP   Medication Adjustments/Labs and Tests Ordered: Current medicines are reviewed at length with the patient today.  Concerns regarding medicines are outlined above.  Orders Placed This Encounter  Procedures   Lipid panel   ALT   EKG 12-Lead   Meds ordered this encounter  Medications   diltiazem (CARDIZEM CD) 120 MG 24 hr capsule    Sig: Take 1 capsule (120 mg total) by mouth daily.    Dispense:  30 capsule    Refill:  6   simvastatin (ZOCOR) 80 MG tablet    Sig: Take 1 tablet (80 mg total) by mouth daily.    Dispense:  90 tablet    Refill:  3    Dose change (increased).    Patient Instructions  Medication Instructions:  Your physician has recommended you make the following change in your medication:   1) START diltiazem (Cardizem) 120mg  daily 2) INCREASE simvastatin (Zocor) to 80mg  daily  *If you need a refill on your cardiac medications before your next appointment, please call your pharmacy*  Lab Work: In 3 months: fasting Lipid panel, ALT If you have labs (blood work) drawn today and your tests are completely normal, you will receive your results only by: MyChart Message (if you have MyChart) OR A paper copy in the mail If you have any lab test that is abnormal or we need to change your treatment, we will call you to review the results.  Testing/Procedures: None  ordered today.  Follow-Up: At [facility], you and your health needs are our priority.  As part of our continuing mission to provide you with exceptional heart care, we have created designated Provider Care Teams.  These Care Teams include your primary Cardiologist (physician) and Advanced Practice Providers (APPs -  Physician Assistants and Nurse Practitioners) who all work together to provide you with the care you need, when you need it.  Your next appointment:   1 year(s)  The format for your next appointment:   In Person  Provider:   Christell Constant, MD    Signed, Christell Constant, MD  06/18/2022 3:36 PM    [facility]

## 2022-06-18 NOTE — Patient Instructions (Signed)
Medication Instructions:  Your physician has recommended you make the following change in your medication:   1) START diltiazem (Cardizem) 120mg  daily 2) INCREASE simvastatin (Zocor) to 80mg  daily  *If you need a refill on your cardiac medications before your next appointment, please call your pharmacy*  Lab Work: In 3 months: fasting Lipid panel, ALT If you have labs (blood work) drawn today and your tests are completely normal, you will receive your results only by: MyChart Message (if you have MyChart) OR A paper copy in the mail If you have any lab test that is abnormal or we need to change your treatment, we will call you to review the results.  Testing/Procedures: None ordered today.  Follow-Up: At [facility], you and your health needs are our priority.  As part of our continuing mission to provide you with exceptional heart care, we have created designated Provider Care Teams.  These Care Teams include your primary Cardiologist (physician) and Advanced Practice Providers (APPs -  Physician Assistants and Nurse Practitioners) who all work together to provide you with the care you need, when you need it.  Your next appointment:   1 year(s)  The format for your next appointment:   In Person  Provider:   Christell Constant, MD

## 2022-06-21 DIAGNOSIS — M25661 Stiffness of right knee, not elsewhere classified: Secondary | ICD-10-CM | POA: Diagnosis not present

## 2022-06-21 DIAGNOSIS — M1711 Unilateral primary osteoarthritis, right knee: Secondary | ICD-10-CM | POA: Diagnosis not present

## 2022-06-21 DIAGNOSIS — M6281 Muscle weakness (generalized): Secondary | ICD-10-CM | POA: Diagnosis not present

## 2022-06-21 DIAGNOSIS — R262 Difficulty in walking, not elsewhere classified: Secondary | ICD-10-CM | POA: Diagnosis not present

## 2022-06-30 ENCOUNTER — Encounter: Payer: Self-pay | Admitting: Internal Medicine

## 2022-06-30 DIAGNOSIS — M1711 Unilateral primary osteoarthritis, right knee: Secondary | ICD-10-CM | POA: Diagnosis not present

## 2022-07-05 DIAGNOSIS — R262 Difficulty in walking, not elsewhere classified: Secondary | ICD-10-CM | POA: Diagnosis not present

## 2022-07-05 DIAGNOSIS — M25661 Stiffness of right knee, not elsewhere classified: Secondary | ICD-10-CM | POA: Diagnosis not present

## 2022-07-05 DIAGNOSIS — M6281 Muscle weakness (generalized): Secondary | ICD-10-CM | POA: Diagnosis not present

## 2022-07-05 DIAGNOSIS — M1711 Unilateral primary osteoarthritis, right knee: Secondary | ICD-10-CM | POA: Diagnosis not present

## 2022-07-06 ENCOUNTER — Encounter: Payer: Self-pay | Admitting: Internal Medicine

## 2022-07-08 DIAGNOSIS — M6281 Muscle weakness (generalized): Secondary | ICD-10-CM | POA: Diagnosis not present

## 2022-07-08 DIAGNOSIS — M25661 Stiffness of right knee, not elsewhere classified: Secondary | ICD-10-CM | POA: Diagnosis not present

## 2022-07-08 DIAGNOSIS — R262 Difficulty in walking, not elsewhere classified: Secondary | ICD-10-CM | POA: Diagnosis not present

## 2022-07-08 DIAGNOSIS — M1711 Unilateral primary osteoarthritis, right knee: Secondary | ICD-10-CM | POA: Diagnosis not present

## 2022-07-10 DIAGNOSIS — R1084 Generalized abdominal pain: Secondary | ICD-10-CM | POA: Diagnosis not present

## 2022-07-10 DIAGNOSIS — K5732 Diverticulitis of large intestine without perforation or abscess without bleeding: Secondary | ICD-10-CM | POA: Diagnosis not present

## 2022-07-16 ENCOUNTER — Encounter (INDEPENDENT_AMBULATORY_CARE_PROVIDER_SITE_OTHER): Payer: Medicare Other | Admitting: Internal Medicine

## 2022-07-16 DIAGNOSIS — M791 Myalgia, unspecified site: Secondary | ICD-10-CM

## 2022-07-16 DIAGNOSIS — T466X5A Adverse effect of antihyperlipidemic and antiarteriosclerotic drugs, initial encounter: Secondary | ICD-10-CM

## 2022-07-16 DIAGNOSIS — Z79899 Other long term (current) drug therapy: Secondary | ICD-10-CM

## 2022-07-16 DIAGNOSIS — E7800 Pure hypercholesterolemia, unspecified: Secondary | ICD-10-CM

## 2022-07-16 DIAGNOSIS — E785 Hyperlipidemia, unspecified: Secondary | ICD-10-CM

## 2022-07-19 DIAGNOSIS — M25661 Stiffness of right knee, not elsewhere classified: Secondary | ICD-10-CM | POA: Diagnosis not present

## 2022-07-19 DIAGNOSIS — M1711 Unilateral primary osteoarthritis, right knee: Secondary | ICD-10-CM | POA: Diagnosis not present

## 2022-07-19 DIAGNOSIS — M6281 Muscle weakness (generalized): Secondary | ICD-10-CM | POA: Diagnosis not present

## 2022-07-19 DIAGNOSIS — R262 Difficulty in walking, not elsewhere classified: Secondary | ICD-10-CM | POA: Diagnosis not present

## 2022-07-21 DIAGNOSIS — M791 Myalgia, unspecified site: Secondary | ICD-10-CM | POA: Insufficient documentation

## 2022-07-21 NOTE — Telephone Encounter (Signed)
Please see the MyChart message reply(ies) for my assessment and plan.    This patient gave consent for this Medical Advice Message and is aware that it may result in a bill to their insurance company, as well as the possibility of receiving a bill for a co-payment or deductible. They are an established patient, but are not seeking medical advice exclusively about a problem treated during an in person or video visit in the last seven days. I did not recommend an in person or video visit within seven days of my reply.    I spent a total of 15 minutes cumulative time within 7 days through MyChart messaging.  Geana Walts A Korie Streat, MD   

## 2022-07-22 DIAGNOSIS — M25661 Stiffness of right knee, not elsewhere classified: Secondary | ICD-10-CM | POA: Diagnosis not present

## 2022-07-22 DIAGNOSIS — M6281 Muscle weakness (generalized): Secondary | ICD-10-CM | POA: Diagnosis not present

## 2022-07-22 DIAGNOSIS — M1711 Unilateral primary osteoarthritis, right knee: Secondary | ICD-10-CM | POA: Diagnosis not present

## 2022-07-22 DIAGNOSIS — R262 Difficulty in walking, not elsewhere classified: Secondary | ICD-10-CM | POA: Diagnosis not present

## 2022-07-23 MED ORDER — EZETIMIBE 10 MG PO TABS
10.0000 mg | ORAL_TABLET | Freq: Every day | ORAL | 2 refills | Status: DC
Start: 2022-07-23 — End: 2022-09-24

## 2022-07-23 NOTE — Addendum Note (Signed)
Addended by: Loa Socks on: 07/23/2022 12:30 PM   Modules accepted: Orders

## 2022-07-23 NOTE — Telephone Encounter (Signed)
RE: Cholesterole meds Received: Today Carol Constant, MD  Carol Socks, LPN Cc: Carol Burows, RN For both [facility] Stop statin, start zetia 10 mg, fasting lipids and ALT in three months. Carol Gamble is on Rosuvastatin (see echo result note) Carol Gamble is on simvastatin  Thanks, Carol Gamble    The pts simvastatin was discontinued and zetia 10 mg po daily was sent to her confirmed pharmacy of choice.  3 month lab appt to check lipids and ALT scheduled for 10/25/22.  Pt aware to come fasting to this lab appt via this mychart message.  Med changes also sent to her husbands pharmacy and placed in his chart, as indicated by Dr. Izora Ribas in this message.  He will also have fasting lipids/ALT in 3 months on 10/25/22.

## 2022-07-26 DIAGNOSIS — M25661 Stiffness of right knee, not elsewhere classified: Secondary | ICD-10-CM | POA: Diagnosis not present

## 2022-07-26 DIAGNOSIS — M6281 Muscle weakness (generalized): Secondary | ICD-10-CM | POA: Diagnosis not present

## 2022-07-26 DIAGNOSIS — R262 Difficulty in walking, not elsewhere classified: Secondary | ICD-10-CM | POA: Diagnosis not present

## 2022-07-26 DIAGNOSIS — M1711 Unilateral primary osteoarthritis, right knee: Secondary | ICD-10-CM | POA: Diagnosis not present

## 2022-08-17 ENCOUNTER — Encounter: Payer: Self-pay | Admitting: Internal Medicine

## 2022-08-17 ENCOUNTER — Other Ambulatory Visit: Payer: Self-pay

## 2022-08-17 DIAGNOSIS — E7800 Pure hypercholesterolemia, unspecified: Secondary | ICD-10-CM

## 2022-08-17 NOTE — Telephone Encounter (Signed)
Order has been placed for lipid clinic.

## 2022-08-25 DIAGNOSIS — M1711 Unilateral primary osteoarthritis, right knee: Secondary | ICD-10-CM | POA: Diagnosis not present

## 2022-09-09 ENCOUNTER — Ambulatory Visit: Payer: Medicare Other

## 2022-09-09 NOTE — Progress Notes (Deleted)
Patient ID: Carol Gamble                 DOB: 24-Jan-1941                    MRN: [id number]     HPI: Carol Gamble is a 82 y.o. female patient referred to lipid clinic by Dr. Izora Ribas. PMH is significant for persistent AF, TIA, aortic atherosclerosis, HTN, obesity. Last visit with Dr. Izora Ribas on 5/17 simvastatin was increased to 80 mg. Pt reported via phone on 07/16/22 increased muscle aches after increasing simvastatin so at that time statin was discontinued and ezetimibe was started. On 08/17/22 pt reported leg swelling with ezetimibe and instructed to restart simvastatin 40 mg until follow up appt with pharmacy lipid clinic.  CT Results? Updated lipid panel?  Current Medications: simvastatin 40 mg, ezetimibe 10 mg  Intolerances:  Risk Factors:  LDL goal: <70 mg/dl  Diet:   Exercise:   Family History:   Social History:   Labs: 2018- TC 180, TG 105, HDL 62, LDL 97  Past Medical History:  Diagnosis Date   A-fib (HCC)    Anemia    Anxiety    Arthritis    HNP- low back    Asthma    Child   Complication of anesthesia    pt. reports that when she wakes up she had a hard time catching her breath & the PACU team told her that her BP & pulse were not doing what it was suppose to do   Depression    Dyslipidemia 02/27/2016   GERD (gastroesophageal reflux disease)    Headache(784.0)    History of asthma    childhood, age 68   History of kidney stones about 2008ish   HNP (herniated nucleus pulposus), lumbar 06/30/2012   Hypertension    states she always has palpitations    PONV (postoperative nausea and vomiting)    Seizure (HCC)    Seizures (HCC) 02/2016   Stroke-like symptoms 02/27/2016    Current Outpatient Medications on File Prior to Visit  Medication Sig Dispense Refill   acetaminophen (TYLENOL) 500 MG tablet Take 1,000 mg by mouth every 6 (six) hours as needed for moderate pain.     apixaban (ELIQUIS) 5 MG TABS tablet Take 5 mg by mouth 2 (two) times  daily.     Ascorbic Acid (VITA-C PO) Take 1 tablet by mouth daily.     atenolol (TENORMIN) 100 MG tablet TAKE 1 TABLET BY MOUTH DAILY 90 tablet 3   CALCIUM-VITAMIN D PO Take 1 tablet by mouth 2 (two) times daily.     diltiazem (CARDIZEM CD) 120 MG 24 hr capsule Take 1 capsule (120 mg total) by mouth daily. 30 capsule 6   diphenhydramine-acetaminophen (TYLENOL PM EXTRA STRENGTH) 25-500 MG TABS tablet as needed (sleep).     ezetimibe (ZETIA) 10 MG tablet Take 1 tablet (10 mg total) by mouth daily. 90 tablet 2   fluticasone (FLONASE) 50 MCG/ACT nasal spray Place 2 sprays into both nostrils daily.     hydroxypropyl methylcellulose (ISOPTO TEARS) 2.5 % ophthalmic solution Place 2 drops into both eyes 3 (three) times daily as needed for dry eyes.      levETIRAcetam (KEPPRA) 500 MG tablet TAKE 1 TABLET BY MOUTH TWICE  DAILY 180 tablet 1   lisinopril-hydrochlorothiazide (PRINZIDE,ZESTORETIC) 20-12.5 MG per tablet Take 1 tablet by mouth daily with breakfast.     Multiple Vitamin (MULTIVITAMIN WITH MINERALS) TABS Take 1 tablet  by mouth daily.     Omega-3 Fatty Acids (FISH OIL) 1200 MG CAPS Take 1,200 mg by mouth 2 (two) times daily.     omeprazole (PRILOSEC) 20 MG capsule Take 20 mg by mouth daily.     No current facility-administered medications on file prior to visit.    Allergies  Allergen Reactions   Shrimp [Shellfish Allergy] Hives    Assessment/Plan:  1. Hyperlipidemia -  Reviewed options for lowering LDL cholesterol, including statins, ezetimibe, PCSK9 inhibitors, Nexletol/Nexlizet, and Leqvio. Discussed mechanisms of action, dosing, side effects and potential decreases in LDL cholesterol. Also reviewed cost information and potential options for patient assistance.

## 2022-09-13 DIAGNOSIS — I10 Essential (primary) hypertension: Secondary | ICD-10-CM | POA: Diagnosis not present

## 2022-09-13 DIAGNOSIS — Z9181 History of falling: Secondary | ICD-10-CM | POA: Diagnosis not present

## 2022-09-13 DIAGNOSIS — E7800 Pure hypercholesterolemia, unspecified: Secondary | ICD-10-CM | POA: Diagnosis not present

## 2022-09-13 DIAGNOSIS — M19049 Primary osteoarthritis, unspecified hand: Secondary | ICD-10-CM | POA: Diagnosis not present

## 2022-09-13 DIAGNOSIS — D6869 Other thrombophilia: Secondary | ICD-10-CM | POA: Diagnosis not present

## 2022-09-13 DIAGNOSIS — Z0000 Encounter for general adult medical examination without abnormal findings: Secondary | ICD-10-CM | POA: Diagnosis not present

## 2022-09-13 DIAGNOSIS — I700 Atherosclerosis of aorta: Secondary | ICD-10-CM | POA: Diagnosis not present

## 2022-09-13 DIAGNOSIS — R7303 Prediabetes: Secondary | ICD-10-CM | POA: Diagnosis not present

## 2022-09-13 DIAGNOSIS — I4891 Unspecified atrial fibrillation: Secondary | ICD-10-CM | POA: Diagnosis not present

## 2022-09-17 ENCOUNTER — Other Ambulatory Visit: Payer: Medicare Other

## 2022-09-21 ENCOUNTER — Other Ambulatory Visit: Payer: Medicare Other

## 2022-09-22 NOTE — Progress Notes (Unsigned)
Patient ID: Carol Gamble                 DOB: [birth year]                    MRN: [id number]     HPI: Carol Gamble is a 82 y.o. female patient referred to lipid clinic by Dr. Izora Ribas. PMH is significant for persistent AF, TIA, aortic atherosclerosis, HTN, obesity. Last visit with Dr. Izora Ribas on 5/17 simvastatin was increased to 80 mg and diltiazem 120 mg daily started for palpitations. Pt reported via phone on 07/16/22 increased muscle aches after increasing simvastatin so at that time statin was discontinued and ezetimibe was started. On 08/17/22 pt discontinued ezetimibe and restarted simvastatin 40 mg daily. No difference in joint pain being off simvastatin. Follow up scheduled with lipid clinic.  Today pt reports to clinic with her husband who also has a visit today. Reports tolerating simvastatin 40 mg daily well. Joint pain did not improve with trial off simvastatin. She attributes joint pain due to aging and arthritis. Reports she picked up diltiazem from pharmacy after visit in May with Dr. Izora Ribas but did not start taking it. Was unsure if she was supposed to continue taking it with her other BP medications. She still reports palpitations every day, notes they worsen at night and keep her from being able to sleep sometimes. Reports BP at home usually 120s/70s. Not doing any physical activity at home.   Current Medications: simvastatin 40 mg daily Intolerances: simvastatin 80 mg daily (muscle aches) Risk Factors: TIA, HTN, obesity LDL goal: <70 mg/dl  Diet: eat out sometimes and will save leftovers 2 meals/day Breakfast: eggs, bacon Lunch: sandwiches with lunch meat Dinner: meat, veggies from son's garden, chicken, beef, hamburger  Exercise: none  Family History: Mother- Alzheimer's; Brother- brain aneurysm  Social History: no tobacco, no alcohol  Labs: 2022: TC 163, TG 144, HDL 53, LDL 85 2018: TC 180, TG 105, HDL 62, LDL 97  Past Medical History:   Diagnosis Date   A-fib (HCC)    Anemia    Anxiety    Arthritis    HNP- low back    Asthma    Child   Complication of anesthesia    pt. reports that when she wakes up she had a hard time catching her breath & the PACU team told her that her BP & pulse were not doing what it was suppose to do   Depression    Dyslipidemia 02/27/2016   GERD (gastroesophageal reflux disease)    Headache(784.0)    History of asthma    childhood, age 69   History of kidney stones about 2008ish   HNP (herniated nucleus pulposus), lumbar 06/30/2012   Hypertension    states she always has palpitations    PONV (postoperative nausea and vomiting)    Seizure (HCC)    Seizures (HCC) 02/2016   Stroke-like symptoms 02/27/2016    Current Outpatient Medications on File Prior to Visit  Medication Sig Dispense Refill   acetaminophen (TYLENOL) 500 MG tablet Take 1,000 mg by mouth every 6 (six) hours as needed for moderate pain.     apixaban (ELIQUIS) 5 MG TABS tablet Take 5 mg by mouth 2 (two) times daily.     Ascorbic Acid (VITA-C PO) Take 1 tablet by mouth daily.     atenolol (TENORMIN) 100 MG tablet TAKE 1 TABLET BY MOUTH DAILY 90 tablet 3   CALCIUM-VITAMIN D PO Take 1  tablet by mouth 2 (two) times daily.     diltiazem (CARDIZEM CD) 120 MG 24 hr capsule Take 1 capsule (120 mg total) by mouth daily. 30 capsule 6   diphenhydramine-acetaminophen (TYLENOL PM EXTRA STRENGTH) 25-500 MG TABS tablet as needed (sleep).     ezetimibe (ZETIA) 10 MG tablet Take 1 tablet (10 mg total) by mouth daily. 90 tablet 2   fluticasone (FLONASE) 50 MCG/ACT nasal spray Place 2 sprays into both nostrils daily.     hydroxypropyl methylcellulose (ISOPTO TEARS) 2.5 % ophthalmic solution Place 2 drops into both eyes 3 (three) times daily as needed for dry eyes.      levETIRAcetam (KEPPRA) 500 MG tablet TAKE 1 TABLET BY MOUTH TWICE  DAILY 180 tablet 1   lisinopril-hydrochlorothiazide (PRINZIDE,ZESTORETIC) 20-12.5 MG per tablet Take 1 tablet  by mouth daily with breakfast.     Multiple Vitamin (MULTIVITAMIN WITH MINERALS) TABS Take 1 tablet by mouth daily.     Omega-3 Fatty Acids (FISH OIL) 1200 MG CAPS Take 1,200 mg by mouth 2 (two) times daily.     omeprazole (PRILOSEC) 20 MG capsule Take 20 mg by mouth daily.     No current facility-administered medications on file prior to visit.    Allergies  Allergen Reactions   Shrimp [Shellfish Allergy] Hives    Assessment/Plan:  1. Hyperlipidemia - LDL above goal <70 mg/dl. Will switch simvastatin 40 mg to rosuvastatin 20 mg for better cholesterol lowering and to avoid drug interaction with diltiazem. Discussed diet and exercise today. Given her recent knee surgery, not able to do much physical activity. Discussed eating a heart healthy diet and cutting back on processed meats like bacon for breakfast. Follow up lipid panel on 10/7.  2. Afib - Pt reports continued palpitations that worsen at night and keep her from being able to sleep. Was supposed to start diltiazem 120 mg daily in May, but there was a miscommunication and she did not start taking it.There is a drug interaction with simvastatin and diltiazem resulting in increased simvastatin concentrations and increased risk of myalgias. Simvastatin doses >10 mg/day should not be used with diltiazem. Additionally, simvastatin may decrease efficacy of diltiazem. Instructed patient to start on diltiazem 120 mg as previously prescribed given she continues to have palpitations. Switched her to rosuvastatin so drug interaction is no longer an issue. BP today slightly elevated, home readings at goal <130/80. Instructed her to monitor BP and continue current blood pressure medications. Counseled her to reach out to clinic if SBP<110.   Adam Phenix, PharmD PGY-1 Pharmacy Resident  Megan E. Supple, PharmD, BCACP, CPP [facility] [address] [place], [place] [address] Phone: [phone]; Fax: [phone] 09/24/2022 3:33 PM

## 2022-09-24 ENCOUNTER — Ambulatory Visit: Payer: Medicare Other | Attending: Interventional Cardiology | Admitting: Pharmacist

## 2022-09-24 VITALS — BP 138/78 | HR 79

## 2022-09-24 DIAGNOSIS — E7800 Pure hypercholesterolemia, unspecified: Secondary | ICD-10-CM

## 2022-09-24 MED ORDER — ROSUVASTATIN CALCIUM 20 MG PO TABS: 20.0000 mg | ORAL_TABLET | Freq: Every day | ORAL | 3 refills | Status: DC

## 2022-09-24 NOTE — Patient Instructions (Signed)
You can start your diltiazem. Continue taking your other blood pressure medications. Keep an eye on your blood pressure and let us know if the top number drops below 110 on a regular basis  Stop your simvastatin. Start rosuvastatin 20mg  daily. Recheck fasting labs on Monday, October 7 any time after 7:30am

## 2022-09-26 ENCOUNTER — Encounter: Payer: Self-pay | Admitting: Pharmacist

## 2022-09-27 ENCOUNTER — Other Ambulatory Visit: Payer: Self-pay | Admitting: Pharmacist

## 2022-09-27 MED ORDER — DILTIAZEM HCL ER COATED BEADS 120 MG PO CP24: 120.0000 mg | ORAL_CAPSULE | Freq: Every day | ORAL | 6 refills | Status: DC

## 2022-10-07 ENCOUNTER — Other Ambulatory Visit: Payer: Self-pay | Admitting: Adult Health

## 2022-10-25 ENCOUNTER — Other Ambulatory Visit: Payer: Medicare Other

## 2022-11-06 ENCOUNTER — Encounter: Payer: Self-pay | Admitting: Pharmacist

## 2022-11-08 ENCOUNTER — Ambulatory Visit: Payer: Medicare Other | Attending: Internal Medicine

## 2022-11-08 DIAGNOSIS — M791 Myalgia, unspecified site: Secondary | ICD-10-CM

## 2022-11-08 DIAGNOSIS — Z23 Encounter for immunization: Secondary | ICD-10-CM | POA: Diagnosis not present

## 2022-11-08 DIAGNOSIS — E785 Hyperlipidemia, unspecified: Secondary | ICD-10-CM

## 2022-11-08 DIAGNOSIS — T466X5A Adverse effect of antihyperlipidemic and antiarteriosclerotic drugs, initial encounter: Secondary | ICD-10-CM | POA: Diagnosis not present

## 2022-11-08 DIAGNOSIS — E7800 Pure hypercholesterolemia, unspecified: Secondary | ICD-10-CM | POA: Diagnosis not present

## 2022-11-08 DIAGNOSIS — Z79899 Other long term (current) drug therapy: Secondary | ICD-10-CM | POA: Diagnosis not present

## 2022-11-09 LAB — LIPID PANEL
Chol/HDL Ratio: 2.8 {ratio} (ref 0.0–4.4)
Cholesterol, Total: 144 mg/dL (ref 100–199)
HDL: 52 mg/dL (ref >39)
LDL Chol Calc (NIH): 67 mg/dL (ref 0–99)
Triglycerides: 145 mg/dL (ref 0–149)
VLDL Cholesterol Cal: 25 mg/dL (ref 5–40)

## 2022-11-09 LAB — ALT: ALT: 17 [IU]/L (ref 0–32)

## 2022-11-14 ENCOUNTER — Encounter: Payer: Self-pay | Admitting: Internal Medicine

## 2022-11-17 DIAGNOSIS — B356 Tinea cruris: Secondary | ICD-10-CM | POA: Diagnosis not present

## 2022-11-30 ENCOUNTER — Other Ambulatory Visit: Payer: Self-pay | Admitting: Family Medicine

## 2022-11-30 DIAGNOSIS — Z1231 Encounter for screening mammogram for malignant neoplasm of breast: Secondary | ICD-10-CM

## 2022-12-16 ENCOUNTER — Other Ambulatory Visit: Payer: Self-pay | Admitting: Adult Health

## 2022-12-29 ENCOUNTER — Ambulatory Visit: Payer: Medicare Other

## 2023-01-05 DIAGNOSIS — H52203 Unspecified astigmatism, bilateral: Secondary | ICD-10-CM | POA: Diagnosis not present

## 2023-01-05 DIAGNOSIS — R7303 Prediabetes: Secondary | ICD-10-CM | POA: Diagnosis not present

## 2023-01-05 DIAGNOSIS — H43813 Vitreous degeneration, bilateral: Secondary | ICD-10-CM | POA: Diagnosis not present

## 2023-01-05 DIAGNOSIS — H04123 Dry eye syndrome of bilateral lacrimal glands: Secondary | ICD-10-CM | POA: Diagnosis not present

## 2023-01-20 ENCOUNTER — Encounter: Payer: Self-pay | Admitting: Adult Health

## 2023-01-20 ENCOUNTER — Ambulatory Visit: Payer: Medicare Other | Admitting: Adult Health

## 2023-01-20 VITALS — BP 157/82 | HR 70 | Ht 64.0 in | Wt 215.0 lb

## 2023-01-20 DIAGNOSIS — R569 Unspecified convulsions: Secondary | ICD-10-CM

## 2023-01-20 MED ORDER — LEVETIRACETAM 500 MG PO TABS: 500.0000 mg | ORAL_TABLET | Freq: Two times a day (BID) | ORAL | 3 refills | Status: DC

## 2023-01-20 NOTE — Patient Instructions (Addendum)
Your Plan:  Continue Keppra 500 mg twice daily for seizure prevention  Please call with any recurrent seizure activity      Follow-up in 1 year or call earlier if needed      Thank you for coming to see [place] at [facility]. I hope we have been able to provide you high quality care today.  You may receive a patient satisfaction survey over the next few weeks. We would appreciate your feedback and comments so that we may continue to improve ourselves and the health of our patients.

## 2023-01-20 NOTE — Progress Notes (Signed)
[facility]  PATIENT: Carol Gamble DOB: 19-Oct-1940   PRIMARY NEUROLOGIST: Dr. Pearlean Brownie REASON FOR VISIT: Seizures HISTORY FROM: Patient    Chief Complaint  Patient presents with   Follow-up    Patient in room #8 and husband  in lobby. Patient states she is well and stable , with no new concerns.   Follow-up visit:  Prior visit: 01/19/2022   Brief HPI:  Carol Gamble is a 82 y.o. female with PMH significant for HTN, HLD, aortic arthrosclerosis and atrial fibrillation who is being followed for seizures. First onset in 2018 with speech difficulties, staring and unresponsiveness with postictal confusion and right hemianopsia episodes. Stroke work up largely unremarkable and diagnosed with seizures at that time. Event in 2021 consisting of altered mental status in setting of significantly increased stressors.   At prior visit, reported recurrent episode of right hemianopsia lasting approximately 15 minutes, no other symptoms or postictal symptoms.  She did report significant increase stressors around that time possibly provoking episode, continued on same dose of Keppra but low threshold to increase with any recurrent event.     Interval history:  Patient returns for follow-up visit unaccompanied (husband waiting in lobby). Has been doing well, denies any seizure activity. Remains on keppra 500mg  BID, previously concern of increased irritability but believes this has improved some.  Continues to maintain ADLs and IADLs independently.  Use of cane for ambulation, denies any recent falls.  Routinely follows with PCP and cardiology.  No questions or concerns at this time.      REVIEW OF SYSTEMS: Full 14 system review of systems performed and notable only for those listed, all others are neg:  Constitutional: neg Cardiovascular: neg Ear/Nose/Throat: neg Skin: neg Eyes: neg Respiratory: neg Gastroitestinal: neg  Hematology/Lymphatic: neg  Endocrine:  neg Musculoskeletal: neg Allergy/Immunology: neg Neurological: Seizure disorder Psychiatric: neg Sleep : neg   ALLERGIES: Allergies  Allergen Reactions   Shrimp [Shellfish Allergy] Hives    HOME MEDICATIONS: Outpatient Medications Prior to Visit  Medication Sig Dispense Refill   acetaminophen (TYLENOL) 500 MG tablet Take 1,000 mg by mouth every 6 (six) hours as needed for moderate pain.     apixaban (ELIQUIS) 5 MG TABS tablet Take 5 mg by mouth 2 (two) times daily.     Ascorbic Acid (VITA-C PO) Take 1 tablet by mouth daily.     atenolol (TENORMIN) 100 MG tablet TAKE 1 TABLET BY MOUTH DAILY 90 tablet 3   CALCIUM-VITAMIN D PO Take 1 tablet by mouth 2 (two) times daily.     diltiazem (CARDIZEM CD) 120 MG 24 hr capsule Take 1 capsule (120 mg total) by mouth daily. 30 capsule 6   diphenhydramine-acetaminophen (TYLENOL PM EXTRA STRENGTH) 25-500 MG TABS tablet as needed (sleep).     fluticasone (FLONASE) 50 MCG/ACT nasal spray Place 2 sprays into both nostrils daily.     hydroxypropyl methylcellulose (ISOPTO TEARS) 2.5 % ophthalmic solution Place 2 drops into both eyes 3 (three) times daily as needed for dry eyes.      levETIRAcetam (KEPPRA) 500 MG tablet TAKE 1 TABLET BY MOUTH TWICE  DAILY 180 tablet 1   lisinopril-hydrochlorothiazide (PRINZIDE,ZESTORETIC) 20-12.5 MG per tablet Take 1 tablet by mouth daily with breakfast.     Multiple Vitamin (MULTIVITAMIN WITH MINERALS) TABS Take 1 tablet by mouth daily.     Omega-3 Fatty Acids (FISH OIL) 1200 MG CAPS Take 1,200 mg by mouth 2 (two) times daily.     omeprazole (PRILOSEC) 20  MG capsule Take 20 mg by mouth daily.     rosuvastatin (CRESTOR) 20 MG tablet Take 1 tablet (20 mg total) by mouth daily. 90 tablet 3   No facility-administered medications prior to visit.    PAST MEDICAL HISTORY: Past Medical History:  Diagnosis Date   A-fib (HCC)    Anemia    Anxiety    Arthritis    HNP- low back    Asthma    Child   Complication of  anesthesia    pt. reports that when she wakes up she had a hard time catching her breath & the PACU team told her that her BP & pulse were not doing what it was suppose to do   Depression    Dyslipidemia 02/27/2016   GERD (gastroesophageal reflux disease)    Headache(784.0)    History of asthma    childhood, age 7   History of kidney stones about 2008ish   HNP (herniated nucleus pulposus), lumbar 06/30/2012   Hypertension    states she always has palpitations    PONV (postoperative nausea and vomiting)    Seizure (HCC)    Seizures (HCC) 02/2016   Stroke-like symptoms 02/27/2016    PAST SURGICAL HISTORY: Past Surgical History:  Procedure Laterality Date   ABDOMINAL HYSTERECTOMY     1993   BACK SURGERY     2000,01,10   CYSTOSCOPY/RETROGRADE/URETEROSCOPY/STONE EXTRACTION WITH BASKET Left 03/01/2013   Procedure: CYSTOSCOPY/RETROGRADE/URETEROSCOPY/STONE EXTRACTION WITH BASKET/INSERTION LEFT DOUBLE  J STENT;  Surgeon: Su Grand, MD;  Location: [facility];  Service: Urology;  Laterality: Left;   FINGER MASS EXCISION  2008   FOOT TENDON SURGERY  1985   HOLMIUM LASER APPLICATION Left 03/01/2013   Procedure: HOLMIUM LASER APPLICATION;  Surgeon: Su Grand, MD;  Location: [facility];  Service: Urology;  Laterality: Left;   INNER EAR SURGERY     x2   PARTIAL KNEE ARTHROPLASTY Left 10/04/2017   Procedure: UNICOMPARTMENTAL LEFT  KNEE;  Surgeon: Sheral Apley, MD;  Location: [facility];  Service: Orthopedics;  Laterality: Left;  Adductor Block   ROTATOR CUFF REPAIR     both shoulders done, last one - 2010   TONSILLECTOMY     TOTAL KNEE ARTHROPLASTY Right 05/18/2022   Procedure: TOTAL KNEE ARTHROPLASTY;  Surgeon: Sheral Apley, MD;  Location: [facility];  Service: Orthopedics;  Laterality: Right;    FAMILY HISTORY: Family History  Problem Relation Age of Onset   Alzheimer's disease Mother    Aneurysm Brother        brain, age 63    SOCIAL HISTORY: Social History   Socioeconomic History    Marital status: Married    Spouse name: Carol Gamble   Number of children: 3   Years of education: 9.5   Highest education level: Not on file  Occupational History    Comment: retired  Tobacco Use   Smoking status: Never   Smokeless tobacco: Never  Vaping Use   Vaping status: Never Used  Substance and Sexual Activity   Alcohol use: No   Drug use: No   Sexual activity: Not on file  Other Topics Concern   Not on file  Social History Narrative   Lives with husband   Caffeine- coffee, 2-3 cups daily   Social Drivers of Corporate investment banker Strain: Not on file  Food Insecurity: Not on file  Transportation Needs: Not on file  Physical Activity: Not on file  Stress: Not on file  Social Connections: Not on  file  Intimate Partner Violence: Not on file     PHYSICAL EXAM Today's Vitals   01/20/23 1307  BP: (!) 157/82  Pulse: 70  Weight: 215 lb (97.5 kg)  Height: 5\' 4"  (1.626 m)   Body mass index is 36.9 kg/m.  General: well developed, well nourished, very pleasant elderly Caucasian female, seated, in no evident distress  Neurologic Exam Mental Status: Awake and fully alert. Oriented to place and time. Recent and remote memory intact. Attention span, concentration and fund of knowledge appropriate. Mood and affect appropriate.  Cranial Nerves: Pupils equal, briskly reactive to light. Extraocular movements full without nystagmus. Visual fields full to confrontation. Hearing intact. Facial sensation intact. Face, tongue, palate moves normally and symmetrically.  Motor: Normal bulk and tone. Normal strength in all tested extremity muscles Sensory.: intact to touch , pinprick , position and vibratory sensation.  Coordination: Rapid alternating movements normal in all extremities. Finger-to-nose and heel-to-shin performed accurately bilaterally. Gait and Station: Arises from chair without difficulty. Stance is normal. Gait demonstrates normal stride length with mild favoring of  left leg/ankle and use of cane           ASSESSMENT AND PLAN  82 y.o. year old female  has a past medical history of history of seizures 02/2016 (episodes of speech difficulty, staring, peripheral visual loss and loss of consciousness), HTN, HLD, and atrial fibrillation on Eliquis. No additional seizures since 10/2019 consisting of transient altered mental status likely in setting of family stressors. Possible recurrent seizure this past week (01/16/2022) consisting of right peripheral visual loss lasting approximately 15 minutes, no other associated symptoms, possibly in setting of significantly increased stressors.  No additional events since that time.     1. Seizure (HCC) -Continue Keppra 500 mg twice daily, advised to call with any mood changes or any other concerns with keppra -Recent lab work completed by PCP -Discussed seizure triggers/activities and importance of avoiding -Advised to call with any recurrent seizure activity    Follow-up in 1 year or call earlier if needed   CC:  Blair Heys, MD (Inactive)    I spent 20 minutes of non-face-to-face and face-to-face time with patient.  This included previsit chart review, lab review, study review, order entry, electronic health record documentation, patient education and discussion regarding above diagnoses and treatment plan and answered all the questions to patient satisfaction  Ihor Austin, [facility]  [facility] [address] [place], [place] [address]  Phone [phone] Fax [phone] Note: This document was prepared with digital dictation and possible smart phrase technology. Any transcriptional errors that result from this process are unintentional.

## 2023-01-22 DIAGNOSIS — R0600 Dyspnea, unspecified: Secondary | ICD-10-CM | POA: Diagnosis not present

## 2023-01-22 DIAGNOSIS — R059 Cough, unspecified: Secondary | ICD-10-CM | POA: Diagnosis not present

## 2023-01-22 DIAGNOSIS — R062 Wheezing: Secondary | ICD-10-CM | POA: Diagnosis not present

## 2023-02-17 ENCOUNTER — Ambulatory Visit
Admission: RE | Admit: 2023-02-17 | Discharge: 2023-02-17 | Disposition: A | Discharge status: Home / Self Care | Payer: Medicare Other | Source: Ambulatory Visit | Attending: Family Medicine | Admitting: Family Medicine

## 2023-02-17 DIAGNOSIS — Z1231 Encounter for screening mammogram for malignant neoplasm of breast: Secondary | ICD-10-CM

## 2023-03-21 ENCOUNTER — Other Ambulatory Visit: Payer: Self-pay

## 2023-03-21 MED ORDER — DILTIAZEM HCL ER COATED BEADS 120 MG PO CP24: 120.0000 mg | ORAL_CAPSULE | Freq: Every day | ORAL | 1 refills | Status: DC

## 2023-05-18 ENCOUNTER — Other Ambulatory Visit (HOSPITAL_COMMUNITY): Payer: Self-pay | Admitting: Orthopedic Surgery

## 2023-05-18 DIAGNOSIS — T84033A Mechanical loosening of internal left knee prosthetic joint, initial encounter: Secondary | ICD-10-CM

## 2023-05-18 DIAGNOSIS — M25562 Pain in left knee: Secondary | ICD-10-CM | POA: Diagnosis not present

## 2023-05-18 DIAGNOSIS — M1711 Unilateral primary osteoarthritis, right knee: Secondary | ICD-10-CM | POA: Diagnosis not present

## 2023-05-27 ENCOUNTER — Encounter (HOSPITAL_COMMUNITY)
Admission: RE | Admit: 2023-05-27 | Discharge: 2023-05-27 | Disposition: A | Discharge status: Home / Self Care | Source: Ambulatory Visit | Attending: Orthopedic Surgery | Admitting: Orthopedic Surgery

## 2023-05-27 DIAGNOSIS — Z96651 Presence of right artificial knee joint: Secondary | ICD-10-CM | POA: Diagnosis not present

## 2023-05-27 DIAGNOSIS — T84033A Mechanical loosening of internal left knee prosthetic joint, initial encounter: Secondary | ICD-10-CM | POA: Insufficient documentation

## 2023-05-27 MED ORDER — TECHNETIUM TC 99M MEDRONATE IV KIT
21.6000 | PACK | Freq: Once | INTRAVENOUS | Status: AC | PRN
Start: 2023-05-27 — End: 2023-05-27
  Administered 2023-05-27: 21.6 via INTRAVENOUS

## 2023-06-08 DIAGNOSIS — M1711 Unilateral primary osteoarthritis, right knee: Secondary | ICD-10-CM | POA: Diagnosis not present

## 2023-06-13 DIAGNOSIS — M25562 Pain in left knee: Secondary | ICD-10-CM | POA: Diagnosis not present

## 2023-07-26 ENCOUNTER — Other Ambulatory Visit: Payer: Self-pay

## 2023-07-26 MED ORDER — ROSUVASTATIN CALCIUM 20 MG PO TABS: 20.0000 mg | ORAL_TABLET | Freq: Every day | ORAL | 0 refills | Status: DC

## 2023-07-26 MED ORDER — DILTIAZEM HCL ER COATED BEADS 120 MG PO CP24: 120.0000 mg | ORAL_CAPSULE | Freq: Every day | ORAL | 0 refills | Status: DC

## 2023-08-08 DIAGNOSIS — M25562 Pain in left knee: Secondary | ICD-10-CM | POA: Diagnosis not present

## 2023-08-15 ENCOUNTER — Ambulatory Visit: Attending: Internal Medicine | Admitting: Internal Medicine

## 2023-08-15 ENCOUNTER — Encounter: Payer: Self-pay | Admitting: Internal Medicine

## 2023-08-15 VITALS — BP 144/80 | HR 61 | Ht 62.0 in | Wt 219.0 lb

## 2023-08-15 DIAGNOSIS — E7800 Pure hypercholesterolemia, unspecified: Secondary | ICD-10-CM | POA: Diagnosis not present

## 2023-08-15 DIAGNOSIS — G459 Transient cerebral ischemic attack, unspecified: Secondary | ICD-10-CM | POA: Diagnosis not present

## 2023-08-15 DIAGNOSIS — I4821 Permanent atrial fibrillation: Secondary | ICD-10-CM | POA: Diagnosis not present

## 2023-08-15 DIAGNOSIS — I10 Essential (primary) hypertension: Secondary | ICD-10-CM

## 2023-08-15 DIAGNOSIS — R5383 Other fatigue: Secondary | ICD-10-CM | POA: Diagnosis not present

## 2023-08-15 DIAGNOSIS — E785 Hyperlipidemia, unspecified: Secondary | ICD-10-CM

## 2023-08-15 NOTE — Patient Instructions (Signed)
 Medication Instructions: Your physician has recommended you make the following change in your medication:  DECREASE: atenolol  to 50 mg by mouth once daily  Please monitor your BP and send in BP log for review; If your BP does fine with decreased dose we will send in a prescription for 50 mg once daily to your pharmacy.   *If you need a refill on your cardiac medications before your next appointment, please call your pharmacy*  Lab Work: CBC and TSH at [facility] on [address] floor  If you have labs (blood work) drawn today and your tests are completely normal, you will receive your results only by: MyChart Message (if you have MyChart) OR A paper copy in the mail If you have any lab test that is abnormal or we need to change your treatment, we will call you to review the results.  Testing/Procedures: NO  Follow-Up: At [facility], you and your health needs are our priority.  As part of our continuing mission to provide you with exceptional heart care, our providers are all part of one team.  This team includes your primary Cardiologist (physician) and Advanced Practice Providers or APPs (Physician Assistants and Nurse Practitioners) who all work together to provide you with the care you need, when you need it.  Your next appointment:   1 year(s)  Provider:   Stanly DELENA Leavens, MD

## 2023-08-15 NOTE — Progress Notes (Signed)
 Cardiology Office Note:    Date:  08/15/2023   ID:  Carol Gamble 1940-07-13, MRN [id number]  PCP:  Hugh Charleston, MD (Inactive)  Cardiologist:  Stanly DELENA Leavens, MD   Referring MD: No ref. provider found   CC:  AF follow up  History of Present Illness:    Carol Gamble is a 83 y.o. female with a hx of persistent AF, HTN, morbid obesity and anticoagulation therapy. Prior history of TIA. 2023: Rate control planned with Dr. Claudene 2024: started on rosuvastatin   Carol Gamble is an 83 year old female with permanent atrial fibrillation who presents with fatigue and medication side effects.  She has a history of permanent atrial fibrillation, hypertension, morbid obesity, hyperlipidemia, and a prior transient ischemic attack (TIA). Her atrial fibrillation was first identified in 2019 during a pre-operative evaluation for knee surgery. She has been on diltiazem  and atenolol  for rate control. While her heart rate has improved since starting diltiazem , she experiences fatigue as a side effect.  She monitors her blood pressure at home, which is typically around 127/63 to 127/67 mmHg. She mentions having 'white coat syndrome,' which may cause elevated readings in clinical settings. Her cholesterol levels have improved significantly with rosuvastatin  therapy.  No bleeding issues such as hemoptysis, hematuria, or melena. No significant sleep disturbances, stating that she only wakes up to use the bathroom and does not snore excessively.  She lives in [place], near [place], and it takes about 25 minutes to get to the clinic.  There is no close [place].  Past Medical History:  Diagnosis Date   A-fib (HCC)    Anemia    Anxiety    Arthritis    HNP- low back    Asthma    Child   Complication of anesthesia    pt. reports that when she wakes up she had a hard time catching her breath & the PACU team told her that her BP & pulse were not doing what it was suppose to do    Depression    Dyslipidemia 02/27/2016   GERD (gastroesophageal reflux disease)    Headache(784.0)    History of asthma    childhood, age 74   History of kidney stones about 2008ish   HNP (herniated nucleus pulposus), lumbar 06/30/2012   Hypertension    states she always has palpitations    PONV (postoperative nausea and vomiting)    Seizure (HCC)    Seizures (HCC) 02/2016   Stroke-like symptoms 02/27/2016    Past Surgical History:  Procedure Laterality Date   ABDOMINAL HYSTERECTOMY     1993   BACK SURGERY     2000,01,10   CYSTOSCOPY/RETROGRADE/URETEROSCOPY/STONE EXTRACTION WITH BASKET Left 03/01/2013   Procedure: CYSTOSCOPY/RETROGRADE/URETEROSCOPY/STONE EXTRACTION WITH BASKET/INSERTION LEFT DOUBLE  J STENT;  Surgeon: Oliva Oiler, MD;  Location: [facility];  Service: Urology;  Laterality: Left;   FINGER MASS EXCISION  2008   FOOT TENDON SURGERY  1985   HOLMIUM LASER APPLICATION Left 03/01/2013   Procedure: HOLMIUM LASER APPLICATION;  Surgeon: Oliva Oiler, MD;  Location: [facility];  Service: Urology;  Laterality: Left;   INNER EAR SURGERY     x2   PARTIAL KNEE ARTHROPLASTY Left 10/04/2017   Procedure: UNICOMPARTMENTAL LEFT  KNEE;  Surgeon: Beverley Evalene BIRCH, MD;  Location: [facility];  Service: Orthopedics;  Laterality: Left;  Adductor Block   ROTATOR CUFF REPAIR     both shoulders done, last one - 2010   TONSILLECTOMY  TOTAL KNEE ARTHROPLASTY Right 05/18/2022   Procedure: TOTAL KNEE ARTHROPLASTY;  Surgeon: Beverley Evalene BIRCH, MD;  Location: [facility];  Service: Orthopedics;  Laterality: Right;    Current Medications: Current Meds  Medication Sig   acetaminophen  (TYLENOL ) 500 MG tablet Take 1,000 mg by mouth every 6 (six) hours as needed for moderate pain.   apixaban  (ELIQUIS ) 5 MG TABS tablet Take 5 mg by mouth 2 (two) times daily.   Ascorbic Acid (VITA-C PO) Take 1 tablet by mouth daily.   atenolol  (TENORMIN ) 100 MG tablet TAKE 1 TABLET BY MOUTH DAILY   CALCIUM -VITAMIN D PO Take 1 tablet  by mouth 2 (two) times daily.   diltiazem  (CARDIZEM  CD) 120 MG 24 hr capsule Take 1 capsule (120 mg total) by mouth daily.   diphenhydramine -acetaminophen  (TYLENOL  PM EXTRA STRENGTH) 25-500 MG TABS tablet as needed (sleep).   fluticasone  (FLONASE ) 50 MCG/ACT nasal spray Place 2 sprays into both nostrils daily.   hydroxypropyl methylcellulose (ISOPTO TEARS) 2.5 % ophthalmic solution Place 2 drops into both eyes 3 (three) times daily as needed for dry eyes.    levETIRAcetam  (KEPPRA ) 500 MG tablet Take 1 tablet (500 mg total) by mouth 2 (two) times daily.   lisinopril -hydrochlorothiazide  (PRINZIDE ,ZESTORETIC ) 20-12.5 MG per tablet Take 1 tablet by mouth daily with breakfast.   Multiple Vitamin (MULTIVITAMIN WITH MINERALS) TABS Take 1 tablet by mouth daily.   Omega-3 Fatty Acids (FISH OIL) 1200 MG CAPS Take 1,200 mg by mouth 2 (two) times daily.   omeprazole (PRILOSEC) 20 MG capsule Take 20 mg by mouth daily.   rosuvastatin  (CRESTOR ) 20 MG tablet Take 1 tablet (20 mg total) by mouth daily.     Allergies:   Shrimp [shellfish allergy]   Social History   Socioeconomic History   Marital status: Married    Spouse name: Toribio   Number of children: 3   Years of education: 9.5   Highest education level: Not on file  Occupational History    Comment: retired  Tobacco Use   Smoking status: Never   Smokeless tobacco: Never  Vaping Use   Vaping status: Never Used  Substance and Sexual Activity   Alcohol  use: No   Drug use: No   Sexual activity: Not on file  Other Topics Concern   Not on file  Social History Narrative   Lives with husband   Caffeine- coffee, 2-3 cups daily   Social Drivers of Corporate investment banker Strain: Not on file  Food Insecurity: Not on file  Transportation Needs: Not on file  Physical Activity: Not on file  Stress: Not on file  Social Connections: Not on file     Family History: The patient's family history includes Alzheimer's disease in her mother;  Aneurysm in her brother.  ROS:   Please see the history of present illness.     EKGs/Labs/Other Studies Reviewed:    The following studies were reviewed today:  Cardiac Studies & Procedures   ______________________________________________________________________________________________     ECHOCARDIOGRAM  ECHOCARDIOGRAM COMPLETE 06/09/2020  Narrative ECHOCARDIOGRAM REPORT    Patient Name:   Neasia JAYSON RADISH Date of Exam: 06/09/2020 Medical Rec #:  [id number]        Height:       62.0 in Accession #:    [id number]       Weight:       225.0 lb Date of Birth:  Oct 10, 1940        BSA:  2.010 m Patient Age:    38 years         BP:           134/84 mmHg Patient Gender: F                HR:           87 bpm. Exam Location:  [address]  Procedure: 2D Echo, 3D Echo, Cardiac Doppler and Color Doppler  Indications:    I48.91 Atrial Fibrillation  History:        Patient has prior history of Echocardiogram examinations, most recent 06/08/2017. Abnormal ECG, TIA, Arrythmias:Atrial Fibrillation, Signs/Symptoms:Shortness of Breath; Risk Factors:Former Smoker, Dyslipidemia and Hypertension. COVID 19 Virus (september 2020, with residual SOB and Palpitations), Obesity.  Sonographer:    Heather Hawks RDCS Referring Phys: [id number] [facility]  IMPRESSIONS   1. Left ventricular ejection fraction, by estimation, is 60 to 65%. The left ventricle has normal function. The left ventricle has no regional wall motion abnormalities. Left ventricular diastolic parameters are indeterminate. 2. Right ventricular systolic function is normal. The right ventricular size is moderately enlarged. There is normal pulmonary artery systolic pressure. 3. Left atrial size was severely dilated. 4. Right atrial size was severely dilated. 5. The mitral valve is normal in structure. No evidence of mitral valve regurgitation. No evidence of mitral stenosis. 6. The aortic valve is calcified. There is mild  calcification of the aortic valve. There is mild thickening of the aortic valve. Aortic valve regurgitation is not visualized. Mild aortic valve sclerosis is present, with no evidence of aortic valve stenosis. 7. The inferior vena cava is normal in size with greater than 50% respiratory variability, suggesting right atrial pressure of 3 mmHg.  FINDINGS Left Ventricle: Left ventricular ejection fraction, by estimation, is 60 to 65%. The left ventricle has normal function. The left ventricle has no regional wall motion abnormalities. The left ventricular internal cavity size was normal in size. There is no left ventricular hypertrophy. Left ventricular diastolic parameters are indeterminate.  Right Ventricle: The right ventricular size is moderately enlarged. No increase in right ventricular wall thickness. Right ventricular systolic function is normal. There is normal pulmonary artery systolic pressure. The tricuspid regurgitant velocity is 2.18 m/s, and with an assumed right atrial pressure of 3 mmHg, the estimated right ventricular systolic pressure is 22.0 mmHg.  Left Atrium: Left atrial size was severely dilated.  Right Atrium: Right atrial size was severely dilated.  Pericardium: There is no evidence of pericardial effusion.  Mitral Valve: The mitral valve is normal in structure. No evidence of mitral valve regurgitation. No evidence of mitral valve stenosis.  Tricuspid Valve: The tricuspid valve is normal in structure. Tricuspid valve regurgitation is not demonstrated. No evidence of tricuspid stenosis.  Aortic Valve: The aortic valve is calcified. There is mild calcification of the aortic valve. There is mild thickening of the aortic valve. Aortic valve regurgitation is not visualized. Mild aortic valve sclerosis is present, with no evidence of aortic valve stenosis.  Pulmonic Valve: The pulmonic valve was normal in structure. Pulmonic valve regurgitation is not visualized. No evidence  of pulmonic stenosis.  Aorta: The aortic root is normal in size and structure.  Venous: The inferior vena cava is normal in size with greater than 50% respiratory variability, suggesting right atrial pressure of 3 mmHg.  IAS/Shunts: No atrial level shunt detected by color flow Doppler.   LEFT VENTRICLE PLAX 2D LVIDd:         3.50  cm  Diastology LVIDs:         1.90 cm  LV e' medial:    8.05 cm/s LV PW:         1.00 cm  LV E/e' medial:  11.7 LV IVS:        0.80 cm  LV e' lateral:   13.50 cm/s LVOT diam:     2.40 cm  LV E/e' lateral: 7.0 LV SV:         60 LV SV Index:   30 LVOT Area:     4.52 cm   RIGHT VENTRICLE RV S prime:     10.10 cm/s TAPSE (M-mode): 1.6 cm  LEFT ATRIUM              Index       RIGHT ATRIUM           Index LA diam:        4.60 cm  2.29 cm/m  RA Area:     29.50 cm LA Vol (A2C):   143.0 ml 71.15 ml/m RA Volume:   106.00 ml 52.74 ml/m LA Vol (A4C):   111.0 ml 55.23 ml/m LA Biplane Vol: 130.0 ml 64.68 ml/m AORTIC VALVE LVOT Vmax:   68.50 cm/s LVOT Vmean:  41.700 cm/s LVOT VTI:    0.133 m  AORTA Ao Root diam: 2.80 cm Ao Asc diam:  3.00 cm  MITRAL VALVE               TRICUSPID VALVE MV Area (PHT): cm         TR Peak grad:   19.0 mmHg MV Decel Time: 160 msec    TR Vmax:        218.00 cm/s MV E velocity: 93.90 cm/s SHUNTS Systemic VTI:  0.13 m Systemic Diam: 2.40 cm  Oneil Parchment MD Electronically signed by Oneil Parchment MD Signature Date/Time: 06/09/2020/3:45:49 PM    Final    MONITORS  LONG TERM MONITOR (3-14 DAYS) 02/23/2019  Narrative  Continuous course atrial fibrillation throughout duration of this monitor.  Heart rate ranges between 50 and 150 bpm. Average heart rate 88 bpm.       ______________________________________________________________________________________________      EKG:   06/18/22: Atrial fibrillation 75  Recent Labs: 11/08/2022: ALT 17  Recent Lipid Panel    Component Value Date/Time   CHOL 144 11/08/2022  1146   TRIG 145 11/08/2022 1146   HDL 52 11/08/2022 1146   CHOLHDL 2.8 11/08/2022 1146   CHOLHDL 2.9 02/28/2016 0749   VLDL 21 02/28/2016 0749   LDLCALC 67 11/08/2022 1146    Physical Exam:    VS:  BP (!) 144/80 (BP Location: Left Arm)   Pulse 61   Ht 5' 2 (1.575 m)   Wt 219 lb (99.3 kg)   SpO2 97%   BMI 40.06 kg/m     Wt Readings from Last 3 Encounters:  08/15/23 219 lb (99.3 kg)  01/20/23 215 lb (97.5 kg)  06/18/22 216 lb (98 kg)    GEN: Morbidly obese. No acute distress HEENT: Normal CARDIAC: No murmur. IRIR RESPIRATORY:  Clear to auscultation without rales, wheezing or rhonchi  ABDOMEN: Soft, non-tender, non-distended SKIN: Warm and dry, no LE edema NEUROLOGIC:  Alert and oriented x 3 PSYCHIATRIC:  Normal affect   ASSESSMENT:    1. Hypercholesterolemia   2. Permanent atrial fibrillation (HCC)   3. Primary hypertension   4. TIA (transient ischemic attack)   5. Dyslipidemia   6. Fatigue,  unspecified type      PLAN:    Permanent atrial fibrillation Permanent atrial fibrillation with rate control issues. Worsening palpitations in 2024 managed with diltiazem . Echocardiogram from 2022 shows severely dilated left and right atrium. Ablation not recommended due to low likelihood of success. Current management includes diltiazem , which is effective but causes fatigue. - Continue diltiazem  for rate control. - Discussed potential use of digoxin as an alternative, but not favored due to side effects.  Hypertension Morbid obesity Hypertension with well-controlled ambulatory blood pressures at home (approximately 127/63-67 mmHg). Current medications include atenolol  and diltiazem . White coat syndrome noted during office visits. - Reduce atenolol  dose from 100 mg to 50 mg daily. - Monitor blood pressure at home and report if significantly elevated. - If blood pressure remains controlled and fatigue improves, maintain reduced atenolol  dose.  Fatigue Fatigue potentially  related to diltiazem  and atenolol  use. Other potential causes include anemia and thyroid dysfunction. No evidence of sleep apnea or other sleep disturbances. - Reduce atenolol  dose to assess impact on fatigue. - Order CBC and TSH in two weeks to evaluate for anemia and thyroid dysfunction.  Hyperlipidemia Hyperlipidemia managed with rosuvastatin . Cholesterol levels are well-controlled. - Continue rosuvastatin  as prescribed.  History of TIA History of TIA managed with anticoagulation and rosuvastatin . - Continue current anticoagulation therapy.  Stanly Leavens, MD FASE [facility] Cardiologist [facility]  [address] [place], [place] [address] [phone]  2:37 PM

## 2023-08-26 ENCOUNTER — Encounter: Payer: Self-pay | Admitting: Internal Medicine

## 2023-08-29 ENCOUNTER — Other Ambulatory Visit: Payer: Self-pay

## 2023-08-29 DIAGNOSIS — R5383 Other fatigue: Secondary | ICD-10-CM

## 2023-08-30 ENCOUNTER — Ambulatory Visit: Payer: Self-pay | Admitting: Internal Medicine

## 2023-08-30 LAB — CBC
Hematocrit: 46.3 % (ref 34.0–46.6)
Hemoglobin: 14.9 g/dL (ref 11.1–15.9)
MCH: 29.9 pg (ref 26.6–33.0)
MCHC: 32.2 g/dL (ref 31.5–35.7)
MCV: 93 fL (ref 79–97)
Platelets: 187 x10E3/uL (ref 150–450)
RBC: 4.99 x10E6/uL (ref 3.77–5.28)
RDW: 14.2 % (ref 11.7–15.4)
WBC: 4.8 x10E3/uL (ref 3.4–10.8)

## 2023-08-30 LAB — TSH: TSH: 2.08 u[IU]/mL (ref 0.450–4.500)

## 2023-09-06 ENCOUNTER — Other Ambulatory Visit: Payer: Self-pay | Admitting: Internal Medicine

## 2023-09-11 ENCOUNTER — Other Ambulatory Visit: Payer: Self-pay | Admitting: Internal Medicine

## 2023-09-13 DIAGNOSIS — K219 Gastro-esophageal reflux disease without esophagitis: Secondary | ICD-10-CM | POA: Diagnosis not present

## 2023-09-13 DIAGNOSIS — R7303 Prediabetes: Secondary | ICD-10-CM | POA: Diagnosis not present

## 2023-09-13 DIAGNOSIS — I493 Ventricular premature depolarization: Secondary | ICD-10-CM | POA: Diagnosis not present

## 2023-09-13 DIAGNOSIS — B356 Tinea cruris: Secondary | ICD-10-CM | POA: Diagnosis not present

## 2023-09-13 DIAGNOSIS — E782 Mixed hyperlipidemia: Secondary | ICD-10-CM | POA: Diagnosis not present

## 2023-09-13 DIAGNOSIS — Z0000 Encounter for general adult medical examination without abnormal findings: Secondary | ICD-10-CM | POA: Diagnosis not present

## 2023-09-13 DIAGNOSIS — I4891 Unspecified atrial fibrillation: Secondary | ICD-10-CM | POA: Diagnosis not present

## 2023-09-13 DIAGNOSIS — D6869 Other thrombophilia: Secondary | ICD-10-CM | POA: Diagnosis not present

## 2023-09-13 DIAGNOSIS — I10 Essential (primary) hypertension: Secondary | ICD-10-CM | POA: Diagnosis not present

## 2023-11-30 ENCOUNTER — Other Ambulatory Visit: Payer: Self-pay | Admitting: Adult Health

## 2024-01-19 ENCOUNTER — Ambulatory Visit: Payer: Medicare Other | Admitting: Adult Health

## 2024-02-01 ENCOUNTER — Encounter: Payer: Self-pay | Admitting: Adult Health

## 2024-02-01 ENCOUNTER — Ambulatory Visit: Admitting: Adult Health

## 2024-02-01 VITALS — BP 132/66 | HR 59 | Ht 62.0 in | Wt 222.6 lb

## 2024-02-01 DIAGNOSIS — B356 Tinea cruris: Secondary | ICD-10-CM | POA: Insufficient documentation

## 2024-02-01 DIAGNOSIS — G44229 Chronic tension-type headache, not intractable: Secondary | ICD-10-CM

## 2024-02-01 DIAGNOSIS — R569 Unspecified convulsions: Secondary | ICD-10-CM | POA: Diagnosis not present

## 2024-02-01 MED ORDER — LEVETIRACETAM 500 MG PO TABS: 500.0000 mg | ORAL_TABLET | Freq: Two times a day (BID) | ORAL | 3 refills | Status: AC

## 2024-02-01 NOTE — Progress Notes (Signed)
 "  [facility]  PATIENT: Carol Gamble DOB: February 23, 1940   PRIMARY NEUROLOGIST: Dr. Rosemarie REASON FOR VISIT: Seizures HISTORY FROM: Patient    Chief Complaint  Patient presents with   Seizures    RM 3. Pt is here Alone. Pt states she has been stable since her last appointment. Pt states she has been having some headaches.    Follow-up visit:  Prior visit: 01/20/2023   Brief HPI:  Carol Gamble is a 83 y.o. female with PMH significant for HTN, HLD, aortic arthrosclerosis and atrial fibrillation who is being followed for seizures. First onset in 2018 with speech difficulties, staring and unresponsiveness with postictal confusion and right hemianopsia episodes and initiated Keppra . Stroke work up largely unremarkable and diagnosed with seizures at that time. Event in 2021 consisting of altered mental status in setting of significantly increased stressors.  Recurrent episode 01/2022 consisting of right hemianopsia lasting approximately 15 minutes possibly in setting of increased stress therefore patient declined interest in increasing Keppra  dosage.  At prior visit, reported no recurrent seizure-like episodes and continued on Keppra  500 mg twice daily.     Interval history:  Patient returns for follow-up visit unaccompanied. Has been doing well, denies any seizure activity. Remains on keppra  500mg  BID, can have occasional times of increased irritability but hesitant to switch medications.   Continues to maintain ADLs and IADLs independently.  Use of cane for ambulation, denies any recent falls. Wearing ankle brace, having issues with ankle tendon which is chronic, possibly needs to have fusion but still considering. Routinely follows with PCP and cardiology. Long standing history of headaches since childhood, located bilateral parietal/temporal area, typically not debilitating and if needed, will usually resolves after resting, use of Tylenol  on occasion with some  benefit. Previously used r.r. donnelley with great benefit but is aware she she is unable to take due to concurrent use of Eliquis . No further questions or concerns at this time.         REVIEW OF SYSTEMS: Full 14 system review of systems performed and notable only for those listed, all others are neg:  Constitutional: neg Cardiovascular: neg Ear/Nose/Throat: neg Skin: neg Eyes: neg Respiratory: neg Gastroitestinal: neg  Hematology/Lymphatic: neg  Endocrine: neg Musculoskeletal: neg Allergy/Immunology: neg Neurological: Seizure disorder Psychiatric: neg Sleep : neg   ALLERGIES: Allergies  Allergen Reactions   Shrimp [Shellfish Allergy] Hives    HOME MEDICATIONS: Outpatient Medications Prior to Visit  Medication Sig Dispense Refill   acetaminophen  (TYLENOL ) 500 MG tablet Take 1,000 mg by mouth every 6 (six) hours as needed for moderate pain.     apixaban  (ELIQUIS ) 5 MG TABS tablet Take 5 mg by mouth 2 (two) times daily.     Ascorbic Acid (VITA-C PO) Take 1 tablet by mouth daily.     atenolol  (TENORMIN ) 100 MG tablet TAKE 1 TABLET BY MOUTH DAILY 90 tablet 3   CALCIUM -VITAMIN D PO Take 1 tablet by mouth 2 (two) times daily.     diltiazem  (CARDIZEM  CD) 120 MG 24 hr capsule Take 1 capsule by mouth once daily 90 capsule 3   diphenhydramine -acetaminophen  (TYLENOL  PM EXTRA STRENGTH) 25-500 MG TABS tablet as needed (sleep).     fluticasone  (FLONASE ) 50 MCG/ACT nasal spray Place 2 sprays into both nostrils daily.     hydroxypropyl methylcellulose (ISOPTO TEARS) 2.5 % ophthalmic solution Place 2 drops into both eyes 3 (three) times daily as needed for dry eyes.      lisinopril -hydrochlorothiazide  (PRINZIDE ,ZESTORETIC ) 20-12.5 MG per  tablet Take 1 tablet by mouth daily with breakfast.     Multiple Vitamin (MULTIVITAMIN WITH MINERALS) TABS Take 1 tablet by mouth daily.     Omega-3 Fatty Acids (FISH OIL) 1200 MG CAPS Take 1,200 mg by mouth 2 (two) times daily.     omeprazole  (PRILOSEC) 20 MG capsule Take 20 mg by mouth daily.     rosuvastatin  (CRESTOR ) 20 MG tablet Take 1 tablet by mouth once daily 90 tablet 3   levETIRAcetam  (KEPPRA ) 500 MG tablet TAKE 1 TABLET BY MOUTH TWICE  DAILY 200 tablet 2   No facility-administered medications prior to visit.    PAST MEDICAL HISTORY: Past Medical History:  Diagnosis Date   A-fib (HCC)    Anemia    Anxiety    Arthritis    HNP- low back    Asthma    Child   Complication of anesthesia    pt. reports that when she wakes up she had a hard time catching her breath & the PACU team told her that her BP & pulse were not doing what it was suppose to do   Depression    Dyslipidemia 02/27/2016   GERD (gastroesophageal reflux disease)    Headache(784.0)    History of asthma    childhood, age 79   History of kidney stones about 2008ish   HNP (herniated nucleus pulposus), lumbar 06/30/2012   Hypertension    states she always has palpitations    PONV (postoperative nausea and vomiting)    Seizure (HCC)    Seizures (HCC) 02/2016   Stroke-like symptoms 02/27/2016    PAST SURGICAL HISTORY: Past Surgical History:  Procedure Laterality Date   ABDOMINAL HYSTERECTOMY     1993   BACK SURGERY     2000,01,10   CYSTOSCOPY/RETROGRADE/URETEROSCOPY/STONE EXTRACTION WITH BASKET Left 03/01/2013   Procedure: CYSTOSCOPY/RETROGRADE/URETEROSCOPY/STONE EXTRACTION WITH BASKET/INSERTION LEFT DOUBLE  J STENT;  Surgeon: Oliva Oiler, MD;  Location: [facility];  Service: Urology;  Laterality: Left;   FINGER MASS EXCISION  2008   FOOT TENDON SURGERY  1985   HOLMIUM LASER APPLICATION Left 03/01/2013   Procedure: HOLMIUM LASER APPLICATION;  Surgeon: Oliva Oiler, MD;  Location: [facility];  Service: Urology;  Laterality: Left;   INNER EAR SURGERY     x2   PARTIAL KNEE ARTHROPLASTY Left 10/04/2017   Procedure: UNICOMPARTMENTAL LEFT  KNEE;  Surgeon: Beverley Evalene BIRCH, MD;  Location: [facility];  Service: Orthopedics;  Laterality: Left;  Adductor Block   ROTATOR  CUFF REPAIR     both shoulders done, last one - 2010   TONSILLECTOMY     TOTAL KNEE ARTHROPLASTY Right 05/18/2022   Procedure: TOTAL KNEE ARTHROPLASTY;  Surgeon: Beverley Evalene BIRCH, MD;  Location: [facility];  Service: Orthopedics;  Laterality: Right;    FAMILY HISTORY: Family History  Problem Relation Age of Onset   Alzheimer's disease Mother    Aneurysm Brother        brain, age 41    SOCIAL HISTORY: Social History   Socioeconomic History   Marital status: Married    Spouse name: Toribio   Number of children: 3   Years of education: 9.5   Highest education level: Not on file  Occupational History    Comment: retired  Tobacco Use   Smoking status: Never   Smokeless tobacco: Never  Vaping Use   Vaping status: Never Used  Substance and Sexual Activity   Alcohol  use: No   Drug use: No   Sexual activity: Not  on file  Other Topics Concern   Not on file  Social History Narrative   Lives with husband   Caffeine- coffee, 2-3 cups daily   Social Drivers of Health   Tobacco Use: Low Risk (02/01/2024)   Patient History    Smoking Tobacco Use: Never    Smokeless Tobacco Use: Never    Passive Exposure: Not on file  Financial Resource Strain: Not on file  Food Insecurity: Not on file  Transportation Needs: Not on file  Physical Activity: Not on file  Stress: Not on file  Social Connections: Not on file  Intimate Partner Violence: Not on file  Depression (EYV7-0): Not on file  Alcohol  Screen: Not on file  Housing: Not on file  Utilities: Not on file  Health Literacy: Not on file       PHYSICAL EXAM Today's Vitals   02/01/24 1109  BP: 132/66  Pulse: (!) 59  Weight: 222 lb 9.6 oz (101 kg)  Height: 5' 2 (1.575 m)    Body mass index is 40.71 kg/m.  General: well developed, well nourished, very pleasant elderly Caucasian female, seated, in no evident distress  Neurologic Exam Mental Status: Awake and fully alert. Oriented to place and time. Recent and remote  memory intact. Attention span, concentration and fund of knowledge appropriate. Mood and affect appropriate.  Cranial Nerves: Pupils equal, briskly reactive to light. Extraocular movements full without nystagmus. Visual fields full to confrontation. Hearing intact. Facial sensation intact. Face, tongue, palate moves normally and symmetrically.  Motor: Normal bulk and tone. Normal strength in all tested extremity muscles Sensory.: intact to touch , pinprick , position and vibratory sensation.  Coordination: Rapid alternating movements normal in all extremities. Finger-to-nose and heel-to-shin performed accurately bilaterally. Gait and Station: Arises from chair without difficulty. Stance is normal. Gait demonstrates normal stride length with mild favoring of left leg/ankle and use of cane           ASSESSMENT AND PLAN  83 y.o. year old female  has a past medical history of history of seizures 02/2016 (episodes of speech difficulty, staring, peripheral visual loss and loss of consciousness), HTN, HLD, and atrial fibrillation on Eliquis . No additional seizures since 10/2019 consisting of transient altered mental status likely in setting of family stressors. Possible recurrent seizure this past week (01/16/2022) consisting of right peripheral visual loss lasting approximately 15 minutes, no other associated symptoms, possibly in setting of significantly increased stressors.  No additional events since that time.     1. Seizure (HCC) -Continue Keppra  500 mg twice daily, advised to call with any mood changes or any other concerns with keppra  -Recent lab work completed by PCP -Discussed seizure triggers/activities and importance of avoiding -Advised to call with any recurrent seizure activity  2. Chronic headaches -since childhood -continue use of Tylenol  as needed, advised to limit use to no more than 2-3 times per week or 8 times per month to avoid rebound headache -advised to call if these  should worsen       Follow-up in 1 year or call earlier if needed   CC:  Hugh Charleston, MD (Inactive)     Harlene Bogaert, [facility]  [facility] [address] [place], [place] [address]  Phone [phone] Fax [phone] Note: This document was prepared with digital dictation and possible smart phrase technology. Any transcriptional errors that result from this process are unintentional. "

## 2024-02-01 NOTE — Patient Instructions (Addendum)
 Your Plan:  Continue keppra  500mg  twice daily for seizure prevention  Please call with any recurrent seizure activity or if you wish to change the medication, please let me know  Please call with any worsening headaches or if Tylenol  dose not seem to be helpful. Please do not use more than 2-3 times per week or 8 times per month to prevent rebound headaches.      Follow up in 1 year or call earlier if needed      Thank you for coming to see us  at [facility]. I hope we have been able to provide you high quality care today.  You may receive a patient satisfaction survey over the next few weeks. We would appreciate your feedback and comments so that we may continue to improve ourselves and the health of our patients.

## 2025-02-04 ENCOUNTER — Ambulatory Visit: Admitting: Adult Health
